# Patient Record
Sex: Female | Born: 1945 | Race: White | Hispanic: No | Marital: Married | Smoking: Former smoker
Health system: Southern US, Community
[De-identification: ages and names within clinical notes are randomized; demographics above are authoritative.]

## PROBLEM LIST (undated history)

## (undated) DIAGNOSIS — K219 Gastro-esophageal reflux disease without esophagitis: Secondary | ICD-10-CM

## (undated) DIAGNOSIS — M810 Age-related osteoporosis without current pathological fracture: Secondary | ICD-10-CM

## (undated) DIAGNOSIS — G4730 Sleep apnea, unspecified: Secondary | ICD-10-CM

## (undated) DIAGNOSIS — T4145XA Adverse effect of unspecified anesthetic, initial encounter: Secondary | ICD-10-CM

## (undated) DIAGNOSIS — T8859XA Other complications of anesthesia, initial encounter: Secondary | ICD-10-CM

## (undated) DIAGNOSIS — Z8669 Personal history of other diseases of the nervous system and sense organs: Secondary | ICD-10-CM

## (undated) DIAGNOSIS — Z8639 Personal history of other endocrine, nutritional and metabolic disease: Secondary | ICD-10-CM

## (undated) DIAGNOSIS — Z9889 Other specified postprocedural states: Secondary | ICD-10-CM

## (undated) DIAGNOSIS — R519 Headache, unspecified: Secondary | ICD-10-CM

## (undated) DIAGNOSIS — G40309 Generalized idiopathic epilepsy and epileptic syndromes, not intractable, without status epilepticus: Secondary | ICD-10-CM

## (undated) DIAGNOSIS — F329 Major depressive disorder, single episode, unspecified: Secondary | ICD-10-CM

## (undated) DIAGNOSIS — R112 Nausea with vomiting, unspecified: Secondary | ICD-10-CM

## (undated) DIAGNOSIS — C4490 Unspecified malignant neoplasm of skin, unspecified: Secondary | ICD-10-CM

## (undated) DIAGNOSIS — M351 Other overlap syndromes: Secondary | ICD-10-CM

## (undated) DIAGNOSIS — I10 Essential (primary) hypertension: Secondary | ICD-10-CM

## (undated) DIAGNOSIS — F419 Anxiety disorder, unspecified: Secondary | ICD-10-CM

## (undated) DIAGNOSIS — E785 Hyperlipidemia, unspecified: Secondary | ICD-10-CM

## (undated) DIAGNOSIS — M2652 Limited mandibular range of motion: Secondary | ICD-10-CM

## (undated) DIAGNOSIS — E7800 Pure hypercholesterolemia, unspecified: Secondary | ICD-10-CM

## (undated) DIAGNOSIS — H359 Unspecified retinal disorder: Secondary | ICD-10-CM

## (undated) DIAGNOSIS — R569 Unspecified convulsions: Secondary | ICD-10-CM

## (undated) DIAGNOSIS — G43909 Migraine, unspecified, not intractable, without status migrainosus: Secondary | ICD-10-CM

## (undated) DIAGNOSIS — M797 Fibromyalgia: Secondary | ICD-10-CM

## (undated) DIAGNOSIS — M1990 Unspecified osteoarthritis, unspecified site: Secondary | ICD-10-CM

## (undated) DIAGNOSIS — Z8679 Personal history of other diseases of the circulatory system: Secondary | ICD-10-CM

## (undated) DIAGNOSIS — H543 Unqualified visual loss, both eyes: Secondary | ICD-10-CM

## (undated) DIAGNOSIS — R51 Headache: Secondary | ICD-10-CM

## (undated) DIAGNOSIS — E079 Disorder of thyroid, unspecified: Secondary | ICD-10-CM

## (undated) DIAGNOSIS — R159 Full incontinence of feces: Secondary | ICD-10-CM

## (undated) DIAGNOSIS — F32A Depression, unspecified: Secondary | ICD-10-CM

## (undated) DIAGNOSIS — G4733 Obstructive sleep apnea (adult) (pediatric): Secondary | ICD-10-CM

## (undated) DIAGNOSIS — Z970 Presence of artificial eye: Secondary | ICD-10-CM

## (undated) DIAGNOSIS — Z85828 Personal history of other malignant neoplasm of skin: Secondary | ICD-10-CM

## (undated) HISTORY — DX: Headache: R51

## (undated) HISTORY — PX: SHOULDER ARTHROSCOPY WITH SUBACROMIAL DECOMPRESSION: SHX5684

## (undated) HISTORY — DX: Unspecified convulsions: R56.9

## (undated) HISTORY — DX: Pure hypercholesterolemia, unspecified: E78.00

## (undated) HISTORY — PX: OTHER SURGICAL HISTORY: SHX169

## (undated) HISTORY — DX: Unspecified malignant neoplasm of skin, unspecified: C44.90

## (undated) HISTORY — PX: CHOLECYSTECTOMY: SHX55

## (undated) HISTORY — DX: Unqualified visual loss, both eyes: H54.3

## (undated) HISTORY — PX: ABDOMINAL HYSTERECTOMY: SHX81

## (undated) HISTORY — DX: Essential (primary) hypertension: I10

## (undated) HISTORY — DX: Gastro-esophageal reflux disease without esophagitis: K21.9

## (undated) HISTORY — PX: TONSILLECTOMY: SUR1361

## (undated) HISTORY — PX: ENUCLEATION: SHX628

---
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
## 1982-01-26 HISTORY — PX: ENUCLEATION: SHX628

## 1987-01-27 HISTORY — PX: TEMPOROMANDIBULAR JOINT SURGERY: SHX35

## 1991-01-27 HISTORY — PX: LAPAROSCOPIC CHOLECYSTECTOMY: SUR755

## 1994-01-26 HISTORY — PX: EVISCERATION: SHX1539

## 2007-09-15 ENCOUNTER — Ambulatory Visit (HOSPITAL_COMMUNITY): Admission: RE | Admit: 2007-09-15 | Discharge: 2007-09-15 | Payer: Self-pay | Admitting: Orthopedic Surgery

## 2010-01-07 ENCOUNTER — Encounter
Admission: RE | Admit: 2010-01-07 | Discharge: 2010-01-07 | Payer: Self-pay | Source: Home / Self Care | Attending: Neurology | Admitting: Neurology

## 2010-06-10 NOTE — Op Note (Signed)
Erica Bennett, Erica Bennett              ACCOUNT NO.:  [id number]   MEDICAL RECORD NO.:  [id number]          PATIENT TYPE:  AMB   LOCATION:  DAY                          FACILITY:  [facility]   PHYSICIAN:  John L. Rendall, M.D.  DATE OF BIRTH:  01/19/1946   DATE OF PROCEDURE:  09/15/2007  DATE OF DISCHARGE:                               OPERATIVE REPORT   INDICATIONS AND JUSTIFCATION FOR PROCEDURE:  Chronic right shoulder  pain, unremitting with injection and therapy with findings of  impingement syndrome on MRI with partially frayed rotator cuff and  downward sloping acromion.   JUSTIFICATION FOR OUTPATIENT SETTING:  Inpatient not required.   PREOPERATIVE DIAGNOSIS:  Impingement syndrome with partially frayed and  torn but not full thickness rotator cuff tear, right shoulder.   SURGICAL PROCEDURES:  1. Arthroscopic subacromial decompression with bursectomy and      acromioplasty.  2. Glenohumeral debridement of partial thickness frayed rotator cuff.   POSTOPERATIVE DIAGNOSIS:  Impingement syndrome with partially frayed and  torn but not full thickness rotator cuff tear, right shoulder.   SURGEON:  John L. Rendall, M.D.   ANESTHESIA:  General with scalene block.   PROCEDURE:  Under scalene block with anesthesia, the right shoulder was  prepared with DuraPrep and draped as a sterile field.  In the left  lateral decubitus position on a bean bag, the arm was suspended with the  fishing pole shoulder holder and 15 pounds weight.  Anatomic landmarks  were marked out.  The shoulder was injected with 30 mL of saline with  epinephrine to assist in controlling bleeding, both in the glenohumeral  and subacromial space.  The glenohumeral joint was entered through a  posterior puncture after first probing with an 18 gauge needle.  The  glenohumeral joint has a normal labrum with the exception of slight  fraying of the labrum posteriorly.  Biceps was intact.  Humeral head and  glenoid showed no  significant arthritic damage, however up near the  biceps tendon.  There is fraying of the cuff and by Wissinger rod  technique, an anterior working portal is made and a 4 mm intra-articular  shaver is introduced and the partial thickness tear was debrided.  It  was not full thickness.  After the debridement is complete, the  subacromial space was entered with a posterior viewing portal, anterior  irrigation portal and lateral working portal.  First a bursectomy is  carried out and the [employer] wand set on 7 was used for periosteal  debridement of the undersurface of the acromion. Significant spurring  was seen at the anterolateral margin of the acromion.  The wand was used  to expose of 3-4 mm of this and then a 6 mm bur is used to plane the  undersurface of the acromion, taking out the spur.  The [facility] joint was  viewed there is no significant undersurface spurring and she was  asymptomatic there.  No planing was done of that joint.  At this point  the outside of the rotator cuff is reviewed.  There is no significant  fraying on the outside of  the cuff.  It should be noted that in  examining the glenohumeral joint, one of the shaver portals went through  the supraspinatus in the muscular portion.  It caused no damage.  At  this point having completed the planing, the traction was let off and  appropriate subacromial  decompression was seen.  The wound was irrigated with saline and then  closed with 4-0 nylon.  No injection was done in the shoulder as the  scalene block was excellent.  A sterile dressing and arm sling were  applied and the patient returned to recovery in good condition.      John L. Rendall, M.D.  Electronically Signed     JLR/MEDQ  D:  09/15/2007  T:  09/15/2007  Job:  [id number]

## 2011-02-25 ENCOUNTER — Other Ambulatory Visit (HOSPITAL_COMMUNITY): Payer: Self-pay | Admitting: Neurology

## 2011-02-25 DIAGNOSIS — R413 Other amnesia: Secondary | ICD-10-CM

## 2011-02-25 DIAGNOSIS — G40219 Localization-related (focal) (partial) symptomatic epilepsy and epileptic syndromes with complex partial seizures, intractable, without status epilepticus: Secondary | ICD-10-CM | POA: Diagnosis not present

## 2011-02-25 DIAGNOSIS — Z5181 Encounter for therapeutic drug level monitoring: Secondary | ICD-10-CM | POA: Diagnosis not present

## 2011-02-25 DIAGNOSIS — G43019 Migraine without aura, intractable, without status migrainosus: Secondary | ICD-10-CM | POA: Diagnosis not present

## 2011-03-09 ENCOUNTER — Encounter (HOSPITAL_COMMUNITY)
Admission: RE | Admit: 2011-03-09 | Discharge: 2011-03-09 | Disposition: A | Discharge status: Home / Self Care | Payer: Medicare Other | Source: Ambulatory Visit | Attending: Neurology | Admitting: Neurology

## 2011-03-09 DIAGNOSIS — R413 Other amnesia: Secondary | ICD-10-CM | POA: Diagnosis not present

## 2011-03-09 MED ORDER — FLUDEOXYGLUCOSE F - 18 (FDG) INJECTION
9.5000 | Freq: Once | INTRAVENOUS | Status: AC | PRN
  Administered 2011-03-09: 9.5 via INTRAVENOUS

## 2011-03-18 DIAGNOSIS — N898 Other specified noninflammatory disorders of vagina: Secondary | ICD-10-CM | POA: Diagnosis not present

## 2011-03-23 DIAGNOSIS — G40909 Epilepsy, unspecified, not intractable, without status epilepticus: Secondary | ICD-10-CM | POA: Diagnosis not present

## 2011-03-23 DIAGNOSIS — F0391 Unspecified dementia with behavioral disturbance: Secondary | ICD-10-CM | POA: Diagnosis not present

## 2011-03-23 DIAGNOSIS — J209 Acute bronchitis, unspecified: Secondary | ICD-10-CM | POA: Diagnosis not present

## 2011-03-23 DIAGNOSIS — I10 Essential (primary) hypertension: Secondary | ICD-10-CM | POA: Diagnosis not present

## 2011-03-23 DIAGNOSIS — K219 Gastro-esophageal reflux disease without esophagitis: Secondary | ICD-10-CM | POA: Diagnosis not present

## 2011-03-23 DIAGNOSIS — F909 Attention-deficit hyperactivity disorder, unspecified type: Secondary | ICD-10-CM | POA: Diagnosis not present

## 2011-05-05 DIAGNOSIS — R197 Diarrhea, unspecified: Secondary | ICD-10-CM | POA: Diagnosis not present

## 2011-05-12 DIAGNOSIS — F0390 Unspecified dementia without behavioral disturbance: Secondary | ICD-10-CM | POA: Diagnosis not present

## 2011-05-12 DIAGNOSIS — K5730 Diverticulosis of large intestine without perforation or abscess without bleeding: Secondary | ICD-10-CM | POA: Diagnosis not present

## 2011-05-12 DIAGNOSIS — G40909 Epilepsy, unspecified, not intractable, without status epilepticus: Secondary | ICD-10-CM | POA: Diagnosis not present

## 2011-05-12 DIAGNOSIS — R413 Other amnesia: Secondary | ICD-10-CM | POA: Diagnosis not present

## 2011-05-12 DIAGNOSIS — Z800 Family history of malignant neoplasm of digestive organs: Secondary | ICD-10-CM | POA: Diagnosis not present

## 2011-05-12 DIAGNOSIS — F411 Generalized anxiety disorder: Secondary | ICD-10-CM | POA: Diagnosis not present

## 2011-05-12 DIAGNOSIS — E039 Hypothyroidism, unspecified: Secondary | ICD-10-CM | POA: Diagnosis not present

## 2011-05-12 DIAGNOSIS — R197 Diarrhea, unspecified: Secondary | ICD-10-CM | POA: Diagnosis not present

## 2011-05-12 DIAGNOSIS — Z8679 Personal history of other diseases of the circulatory system: Secondary | ICD-10-CM | POA: Diagnosis not present

## 2011-05-12 DIAGNOSIS — Z87891 Personal history of nicotine dependence: Secondary | ICD-10-CM | POA: Diagnosis not present

## 2011-05-12 DIAGNOSIS — K219 Gastro-esophageal reflux disease without esophagitis: Secondary | ICD-10-CM | POA: Diagnosis not present

## 2011-05-12 DIAGNOSIS — D129 Benign neoplasm of anus and anal canal: Secondary | ICD-10-CM | POA: Diagnosis not present

## 2011-05-12 DIAGNOSIS — D128 Benign neoplasm of rectum: Secondary | ICD-10-CM | POA: Diagnosis not present

## 2011-05-12 DIAGNOSIS — D126 Benign neoplasm of colon, unspecified: Secondary | ICD-10-CM | POA: Diagnosis not present

## 2011-05-12 DIAGNOSIS — H409 Unspecified glaucoma: Secondary | ICD-10-CM | POA: Diagnosis not present

## 2011-05-12 DIAGNOSIS — H547 Unspecified visual loss: Secondary | ICD-10-CM | POA: Diagnosis not present

## 2011-05-12 DIAGNOSIS — K620 Anal polyp: Secondary | ICD-10-CM | POA: Diagnosis not present

## 2011-05-12 DIAGNOSIS — Z79899 Other long term (current) drug therapy: Secondary | ICD-10-CM | POA: Diagnosis not present

## 2011-05-12 DIAGNOSIS — I10 Essential (primary) hypertension: Secondary | ICD-10-CM | POA: Diagnosis not present

## 2011-05-12 DIAGNOSIS — K621 Rectal polyp: Secondary | ICD-10-CM | POA: Diagnosis not present

## 2011-05-25 DIAGNOSIS — Z1231 Encounter for screening mammogram for malignant neoplasm of breast: Secondary | ICD-10-CM | POA: Diagnosis not present

## 2011-06-19 DIAGNOSIS — G43109 Migraine with aura, not intractable, without status migrainosus: Secondary | ICD-10-CM | POA: Diagnosis not present

## 2011-09-21 DIAGNOSIS — G43709 Chronic migraine without aura, not intractable, without status migrainosus: Secondary | ICD-10-CM | POA: Diagnosis not present

## 2011-10-14 DIAGNOSIS — D485 Neoplasm of uncertain behavior of skin: Secondary | ICD-10-CM | POA: Diagnosis not present

## 2011-10-14 DIAGNOSIS — L820 Inflamed seborrheic keratosis: Secondary | ICD-10-CM | POA: Diagnosis not present

## 2011-10-14 DIAGNOSIS — L723 Sebaceous cyst: Secondary | ICD-10-CM | POA: Diagnosis not present

## 2011-10-14 DIAGNOSIS — L821 Other seborrheic keratosis: Secondary | ICD-10-CM | POA: Diagnosis not present

## 2011-10-14 DIAGNOSIS — D1801 Hemangioma of skin and subcutaneous tissue: Secondary | ICD-10-CM | POA: Diagnosis not present

## 2011-10-14 DIAGNOSIS — D235 Other benign neoplasm of skin of trunk: Secondary | ICD-10-CM | POA: Diagnosis not present

## 2011-10-21 DIAGNOSIS — L905 Scar conditions and fibrosis of skin: Secondary | ICD-10-CM | POA: Diagnosis not present

## 2011-12-08 DIAGNOSIS — M25579 Pain in unspecified ankle and joints of unspecified foot: Secondary | ICD-10-CM | POA: Diagnosis not present

## 2011-12-08 DIAGNOSIS — M79609 Pain in unspecified limb: Secondary | ICD-10-CM | POA: Diagnosis not present

## 2011-12-08 DIAGNOSIS — M722 Plantar fascial fibromatosis: Secondary | ICD-10-CM | POA: Diagnosis not present

## 2011-12-25 DIAGNOSIS — I10 Essential (primary) hypertension: Secondary | ICD-10-CM | POA: Diagnosis not present

## 2011-12-25 DIAGNOSIS — R1032 Left lower quadrant pain: Secondary | ICD-10-CM | POA: Diagnosis not present

## 2011-12-29 DIAGNOSIS — M79609 Pain in unspecified limb: Secondary | ICD-10-CM | POA: Diagnosis not present

## 2011-12-29 DIAGNOSIS — M25579 Pain in unspecified ankle and joints of unspecified foot: Secondary | ICD-10-CM | POA: Diagnosis not present

## 2011-12-31 ENCOUNTER — Encounter: Payer: Self-pay | Admitting: Family Medicine

## 2011-12-31 DIAGNOSIS — K4020 Bilateral inguinal hernia, without obstruction or gangrene, not specified as recurrent: Secondary | ICD-10-CM | POA: Diagnosis not present

## 2011-12-31 DIAGNOSIS — R109 Unspecified abdominal pain: Secondary | ICD-10-CM | POA: Diagnosis not present

## 2011-12-31 DIAGNOSIS — R32 Unspecified urinary incontinence: Secondary | ICD-10-CM | POA: Diagnosis not present

## 2011-12-31 DIAGNOSIS — K5730 Diverticulosis of large intestine without perforation or abscess without bleeding: Secondary | ICD-10-CM | POA: Diagnosis not present

## 2012-02-09 DIAGNOSIS — M25579 Pain in unspecified ankle and joints of unspecified foot: Secondary | ICD-10-CM | POA: Diagnosis not present

## 2012-02-09 DIAGNOSIS — M79609 Pain in unspecified limb: Secondary | ICD-10-CM | POA: Diagnosis not present

## 2012-03-17 DIAGNOSIS — Z85828 Personal history of other malignant neoplasm of skin: Secondary | ICD-10-CM | POA: Diagnosis not present

## 2012-03-17 DIAGNOSIS — C4491 Basal cell carcinoma of skin, unspecified: Secondary | ICD-10-CM | POA: Diagnosis not present

## 2012-03-17 DIAGNOSIS — L821 Other seborrheic keratosis: Secondary | ICD-10-CM | POA: Diagnosis not present

## 2012-03-17 DIAGNOSIS — L820 Inflamed seborrheic keratosis: Secondary | ICD-10-CM | POA: Diagnosis not present

## 2012-03-17 DIAGNOSIS — D485 Neoplasm of uncertain behavior of skin: Secondary | ICD-10-CM | POA: Diagnosis not present

## 2012-03-17 DIAGNOSIS — L905 Scar conditions and fibrosis of skin: Secondary | ICD-10-CM | POA: Diagnosis not present

## 2012-03-23 DIAGNOSIS — C44519 Basal cell carcinoma of skin of other part of trunk: Secondary | ICD-10-CM | POA: Diagnosis not present

## 2012-03-23 DIAGNOSIS — C4491 Basal cell carcinoma of skin, unspecified: Secondary | ICD-10-CM | POA: Diagnosis not present

## 2012-03-29 DIAGNOSIS — I10 Essential (primary) hypertension: Secondary | ICD-10-CM | POA: Diagnosis not present

## 2012-04-06 DIAGNOSIS — Z85828 Personal history of other malignant neoplasm of skin: Secondary | ICD-10-CM | POA: Diagnosis not present

## 2012-04-06 DIAGNOSIS — L820 Inflamed seborrheic keratosis: Secondary | ICD-10-CM | POA: Diagnosis not present

## 2012-04-06 DIAGNOSIS — L905 Scar conditions and fibrosis of skin: Secondary | ICD-10-CM | POA: Diagnosis not present

## 2012-04-06 DIAGNOSIS — L821 Other seborrheic keratosis: Secondary | ICD-10-CM | POA: Diagnosis not present

## 2012-04-06 DIAGNOSIS — C44519 Basal cell carcinoma of skin of other part of trunk: Secondary | ICD-10-CM | POA: Diagnosis not present

## 2012-05-05 DIAGNOSIS — C44519 Basal cell carcinoma of skin of other part of trunk: Secondary | ICD-10-CM | POA: Diagnosis not present

## 2012-05-25 DIAGNOSIS — Z1231 Encounter for screening mammogram for malignant neoplasm of breast: Secondary | ICD-10-CM | POA: Diagnosis not present

## 2012-06-28 DIAGNOSIS — I10 Essential (primary) hypertension: Secondary | ICD-10-CM | POA: Diagnosis not present

## 2012-08-01 DIAGNOSIS — Z01419 Encounter for gynecological examination (general) (routine) without abnormal findings: Secondary | ICD-10-CM | POA: Diagnosis not present

## 2012-08-03 DIAGNOSIS — Z85828 Personal history of other malignant neoplasm of skin: Secondary | ICD-10-CM | POA: Diagnosis not present

## 2012-08-03 DIAGNOSIS — D485 Neoplasm of uncertain behavior of skin: Secondary | ICD-10-CM | POA: Diagnosis not present

## 2012-08-16 DIAGNOSIS — M25579 Pain in unspecified ankle and joints of unspecified foot: Secondary | ICD-10-CM | POA: Diagnosis not present

## 2012-08-16 DIAGNOSIS — M79609 Pain in unspecified limb: Secondary | ICD-10-CM | POA: Diagnosis not present

## 2012-08-26 DIAGNOSIS — C44519 Basal cell carcinoma of skin of other part of trunk: Secondary | ICD-10-CM | POA: Diagnosis not present

## 2012-09-05 DIAGNOSIS — B029 Zoster without complications: Secondary | ICD-10-CM | POA: Diagnosis not present

## 2012-09-30 DIAGNOSIS — I10 Essential (primary) hypertension: Secondary | ICD-10-CM | POA: Diagnosis not present

## 2012-09-30 DIAGNOSIS — Z0000 Encounter for general adult medical examination without abnormal findings: Secondary | ICD-10-CM | POA: Diagnosis not present

## 2012-10-19 DIAGNOSIS — Z85828 Personal history of other malignant neoplasm of skin: Secondary | ICD-10-CM | POA: Diagnosis not present

## 2012-10-25 DIAGNOSIS — M79609 Pain in unspecified limb: Secondary | ICD-10-CM | POA: Diagnosis not present

## 2012-10-25 DIAGNOSIS — M25579 Pain in unspecified ankle and joints of unspecified foot: Secondary | ICD-10-CM | POA: Diagnosis not present

## 2012-11-15 DIAGNOSIS — Z86010 Personal history of colonic polyps: Secondary | ICD-10-CM | POA: Diagnosis not present

## 2012-11-29 DIAGNOSIS — Z87891 Personal history of nicotine dependence: Secondary | ICD-10-CM | POA: Diagnosis not present

## 2012-11-29 DIAGNOSIS — K219 Gastro-esophageal reflux disease without esophagitis: Secondary | ICD-10-CM | POA: Diagnosis not present

## 2012-11-29 DIAGNOSIS — G40909 Epilepsy, unspecified, not intractable, without status epilepticus: Secondary | ICD-10-CM | POA: Diagnosis not present

## 2012-11-29 DIAGNOSIS — Z1211 Encounter for screening for malignant neoplasm of colon: Secondary | ICD-10-CM | POA: Diagnosis not present

## 2012-11-29 DIAGNOSIS — K589 Irritable bowel syndrome without diarrhea: Secondary | ICD-10-CM | POA: Diagnosis not present

## 2012-11-29 DIAGNOSIS — I10 Essential (primary) hypertension: Secondary | ICD-10-CM | POA: Diagnosis not present

## 2012-11-29 DIAGNOSIS — K5730 Diverticulosis of large intestine without perforation or abscess without bleeding: Secondary | ICD-10-CM | POA: Diagnosis not present

## 2012-11-29 DIAGNOSIS — E039 Hypothyroidism, unspecified: Secondary | ICD-10-CM | POA: Diagnosis not present

## 2012-11-29 DIAGNOSIS — Z882 Allergy status to sulfonamides status: Secondary | ICD-10-CM | POA: Diagnosis not present

## 2012-11-29 DIAGNOSIS — H543 Unqualified visual loss, both eyes: Secondary | ICD-10-CM | POA: Diagnosis not present

## 2012-11-29 DIAGNOSIS — Z800 Family history of malignant neoplasm of digestive organs: Secondary | ICD-10-CM | POA: Diagnosis not present

## 2012-11-29 DIAGNOSIS — Z860100 Personal history of colon polyps, unspecified: Secondary | ICD-10-CM | POA: Diagnosis not present

## 2012-11-29 DIAGNOSIS — F0390 Unspecified dementia without behavioral disturbance: Secondary | ICD-10-CM | POA: Diagnosis not present

## 2012-11-29 DIAGNOSIS — D126 Benign neoplasm of colon, unspecified: Secondary | ICD-10-CM | POA: Diagnosis not present

## 2012-11-29 DIAGNOSIS — Z8489 Family history of other specified conditions: Secondary | ICD-10-CM | POA: Diagnosis not present

## 2012-11-29 DIAGNOSIS — Z79899 Other long term (current) drug therapy: Secondary | ICD-10-CM | POA: Diagnosis not present

## 2012-11-29 DIAGNOSIS — Z8042 Family history of malignant neoplasm of prostate: Secondary | ICD-10-CM | POA: Diagnosis not present

## 2012-11-30 DIAGNOSIS — D485 Neoplasm of uncertain behavior of skin: Secondary | ICD-10-CM | POA: Diagnosis not present

## 2012-11-30 DIAGNOSIS — L570 Actinic keratosis: Secondary | ICD-10-CM | POA: Diagnosis not present

## 2012-11-30 DIAGNOSIS — Z85828 Personal history of other malignant neoplasm of skin: Secondary | ICD-10-CM | POA: Diagnosis not present

## 2013-01-02 DIAGNOSIS — I10 Essential (primary) hypertension: Secondary | ICD-10-CM | POA: Diagnosis not present

## 2013-03-29 DIAGNOSIS — Z85828 Personal history of other malignant neoplasm of skin: Secondary | ICD-10-CM | POA: Diagnosis not present

## 2013-03-29 DIAGNOSIS — L570 Actinic keratosis: Secondary | ICD-10-CM | POA: Diagnosis not present

## 2013-04-07 DIAGNOSIS — I10 Essential (primary) hypertension: Secondary | ICD-10-CM | POA: Diagnosis not present

## 2013-04-07 DIAGNOSIS — M549 Dorsalgia, unspecified: Secondary | ICD-10-CM | POA: Diagnosis not present

## 2013-04-07 DIAGNOSIS — G4700 Insomnia, unspecified: Secondary | ICD-10-CM | POA: Diagnosis not present

## 2013-04-11 DIAGNOSIS — M25579 Pain in unspecified ankle and joints of unspecified foot: Secondary | ICD-10-CM | POA: Diagnosis not present

## 2013-04-11 DIAGNOSIS — M79609 Pain in unspecified limb: Secondary | ICD-10-CM | POA: Diagnosis not present

## 2013-04-14 DIAGNOSIS — M999 Biomechanical lesion, unspecified: Secondary | ICD-10-CM | POA: Diagnosis not present

## 2013-04-14 DIAGNOSIS — M545 Low back pain, unspecified: Secondary | ICD-10-CM | POA: Diagnosis not present

## 2013-04-14 DIAGNOSIS — S335XXA Sprain of ligaments of lumbar spine, initial encounter: Secondary | ICD-10-CM | POA: Diagnosis not present

## 2013-04-18 DIAGNOSIS — M545 Low back pain, unspecified: Secondary | ICD-10-CM | POA: Diagnosis not present

## 2013-04-18 DIAGNOSIS — M999 Biomechanical lesion, unspecified: Secondary | ICD-10-CM | POA: Diagnosis not present

## 2013-04-18 DIAGNOSIS — S335XXA Sprain of ligaments of lumbar spine, initial encounter: Secondary | ICD-10-CM | POA: Diagnosis not present

## 2013-04-20 DIAGNOSIS — M545 Low back pain, unspecified: Secondary | ICD-10-CM | POA: Diagnosis not present

## 2013-04-20 DIAGNOSIS — S335XXA Sprain of ligaments of lumbar spine, initial encounter: Secondary | ICD-10-CM | POA: Diagnosis not present

## 2013-04-20 DIAGNOSIS — M999 Biomechanical lesion, unspecified: Secondary | ICD-10-CM | POA: Diagnosis not present

## 2013-04-24 DIAGNOSIS — S335XXA Sprain of ligaments of lumbar spine, initial encounter: Secondary | ICD-10-CM | POA: Diagnosis not present

## 2013-04-24 DIAGNOSIS — M545 Low back pain, unspecified: Secondary | ICD-10-CM | POA: Diagnosis not present

## 2013-04-24 DIAGNOSIS — M999 Biomechanical lesion, unspecified: Secondary | ICD-10-CM | POA: Diagnosis not present

## 2013-04-26 DIAGNOSIS — M545 Low back pain, unspecified: Secondary | ICD-10-CM | POA: Diagnosis not present

## 2013-04-26 DIAGNOSIS — S335XXA Sprain of ligaments of lumbar spine, initial encounter: Secondary | ICD-10-CM | POA: Diagnosis not present

## 2013-04-26 DIAGNOSIS — M999 Biomechanical lesion, unspecified: Secondary | ICD-10-CM | POA: Diagnosis not present

## 2013-05-02 DIAGNOSIS — M545 Low back pain, unspecified: Secondary | ICD-10-CM | POA: Diagnosis not present

## 2013-05-02 DIAGNOSIS — M999 Biomechanical lesion, unspecified: Secondary | ICD-10-CM | POA: Diagnosis not present

## 2013-05-02 DIAGNOSIS — S335XXA Sprain of ligaments of lumbar spine, initial encounter: Secondary | ICD-10-CM | POA: Diagnosis not present

## 2013-05-10 DIAGNOSIS — M545 Low back pain, unspecified: Secondary | ICD-10-CM | POA: Diagnosis not present

## 2013-05-10 DIAGNOSIS — S335XXA Sprain of ligaments of lumbar spine, initial encounter: Secondary | ICD-10-CM | POA: Diagnosis not present

## 2013-05-10 DIAGNOSIS — M999 Biomechanical lesion, unspecified: Secondary | ICD-10-CM | POA: Diagnosis not present

## 2013-05-16 DIAGNOSIS — M999 Biomechanical lesion, unspecified: Secondary | ICD-10-CM | POA: Diagnosis not present

## 2013-05-16 DIAGNOSIS — S335XXA Sprain of ligaments of lumbar spine, initial encounter: Secondary | ICD-10-CM | POA: Diagnosis not present

## 2013-05-16 DIAGNOSIS — M545 Low back pain, unspecified: Secondary | ICD-10-CM | POA: Diagnosis not present

## 2013-05-23 DIAGNOSIS — M999 Biomechanical lesion, unspecified: Secondary | ICD-10-CM | POA: Diagnosis not present

## 2013-05-23 DIAGNOSIS — M545 Low back pain, unspecified: Secondary | ICD-10-CM | POA: Diagnosis not present

## 2013-05-23 DIAGNOSIS — S335XXA Sprain of ligaments of lumbar spine, initial encounter: Secondary | ICD-10-CM | POA: Diagnosis not present

## 2013-05-25 DIAGNOSIS — M999 Biomechanical lesion, unspecified: Secondary | ICD-10-CM | POA: Diagnosis not present

## 2013-05-25 DIAGNOSIS — M545 Low back pain, unspecified: Secondary | ICD-10-CM | POA: Diagnosis not present

## 2013-05-25 DIAGNOSIS — S335XXA Sprain of ligaments of lumbar spine, initial encounter: Secondary | ICD-10-CM | POA: Diagnosis not present

## 2013-06-05 DIAGNOSIS — Z1231 Encounter for screening mammogram for malignant neoplasm of breast: Secondary | ICD-10-CM | POA: Diagnosis not present

## 2013-06-06 DIAGNOSIS — M545 Low back pain, unspecified: Secondary | ICD-10-CM | POA: Diagnosis not present

## 2013-06-06 DIAGNOSIS — M999 Biomechanical lesion, unspecified: Secondary | ICD-10-CM | POA: Diagnosis not present

## 2013-06-06 DIAGNOSIS — S335XXA Sprain of ligaments of lumbar spine, initial encounter: Secondary | ICD-10-CM | POA: Diagnosis not present

## 2013-06-13 DIAGNOSIS — S335XXA Sprain of ligaments of lumbar spine, initial encounter: Secondary | ICD-10-CM | POA: Diagnosis not present

## 2013-06-13 DIAGNOSIS — M999 Biomechanical lesion, unspecified: Secondary | ICD-10-CM | POA: Diagnosis not present

## 2013-06-13 DIAGNOSIS — M545 Low back pain, unspecified: Secondary | ICD-10-CM | POA: Diagnosis not present

## 2013-06-20 DIAGNOSIS — M545 Low back pain, unspecified: Secondary | ICD-10-CM | POA: Diagnosis not present

## 2013-06-20 DIAGNOSIS — M999 Biomechanical lesion, unspecified: Secondary | ICD-10-CM | POA: Diagnosis not present

## 2013-06-20 DIAGNOSIS — S335XXA Sprain of ligaments of lumbar spine, initial encounter: Secondary | ICD-10-CM | POA: Diagnosis not present

## 2013-06-27 DIAGNOSIS — M545 Low back pain, unspecified: Secondary | ICD-10-CM | POA: Diagnosis not present

## 2013-06-27 DIAGNOSIS — S335XXA Sprain of ligaments of lumbar spine, initial encounter: Secondary | ICD-10-CM | POA: Diagnosis not present

## 2013-06-27 DIAGNOSIS — M999 Biomechanical lesion, unspecified: Secondary | ICD-10-CM | POA: Diagnosis not present

## 2013-07-04 DIAGNOSIS — M999 Biomechanical lesion, unspecified: Secondary | ICD-10-CM | POA: Diagnosis not present

## 2013-07-04 DIAGNOSIS — M545 Low back pain, unspecified: Secondary | ICD-10-CM | POA: Diagnosis not present

## 2013-07-04 DIAGNOSIS — S335XXA Sprain of ligaments of lumbar spine, initial encounter: Secondary | ICD-10-CM | POA: Diagnosis not present

## 2013-07-04 DIAGNOSIS — M778 Other enthesopathies, not elsewhere classified: Secondary | ICD-10-CM | POA: Diagnosis not present

## 2013-07-10 DIAGNOSIS — M545 Low back pain, unspecified: Secondary | ICD-10-CM | POA: Diagnosis not present

## 2013-07-10 DIAGNOSIS — S335XXA Sprain of ligaments of lumbar spine, initial encounter: Secondary | ICD-10-CM | POA: Diagnosis not present

## 2013-07-10 DIAGNOSIS — M999 Biomechanical lesion, unspecified: Secondary | ICD-10-CM | POA: Diagnosis not present

## 2013-07-17 DIAGNOSIS — M999 Biomechanical lesion, unspecified: Secondary | ICD-10-CM | POA: Diagnosis not present

## 2013-07-17 DIAGNOSIS — M545 Low back pain, unspecified: Secondary | ICD-10-CM | POA: Diagnosis not present

## 2013-07-17 DIAGNOSIS — S335XXA Sprain of ligaments of lumbar spine, initial encounter: Secondary | ICD-10-CM | POA: Diagnosis not present

## 2013-07-24 DIAGNOSIS — Z85828 Personal history of other malignant neoplasm of skin: Secondary | ICD-10-CM | POA: Diagnosis not present

## 2013-07-31 DIAGNOSIS — M545 Low back pain, unspecified: Secondary | ICD-10-CM | POA: Diagnosis not present

## 2013-07-31 DIAGNOSIS — S335XXA Sprain of ligaments of lumbar spine, initial encounter: Secondary | ICD-10-CM | POA: Diagnosis not present

## 2013-07-31 DIAGNOSIS — M999 Biomechanical lesion, unspecified: Secondary | ICD-10-CM | POA: Diagnosis not present

## 2013-08-08 DIAGNOSIS — M999 Biomechanical lesion, unspecified: Secondary | ICD-10-CM | POA: Diagnosis not present

## 2013-08-08 DIAGNOSIS — S335XXA Sprain of ligaments of lumbar spine, initial encounter: Secondary | ICD-10-CM | POA: Diagnosis not present

## 2013-08-08 DIAGNOSIS — M545 Low back pain, unspecified: Secondary | ICD-10-CM | POA: Diagnosis not present

## 2013-08-08 DIAGNOSIS — M47817 Spondylosis without myelopathy or radiculopathy, lumbosacral region: Secondary | ICD-10-CM | POA: Diagnosis not present

## 2013-08-10 DIAGNOSIS — M545 Low back pain, unspecified: Secondary | ICD-10-CM | POA: Diagnosis not present

## 2013-08-10 DIAGNOSIS — S335XXA Sprain of ligaments of lumbar spine, initial encounter: Secondary | ICD-10-CM | POA: Diagnosis not present

## 2013-08-10 DIAGNOSIS — M999 Biomechanical lesion, unspecified: Secondary | ICD-10-CM | POA: Diagnosis not present

## 2013-08-14 DIAGNOSIS — M999 Biomechanical lesion, unspecified: Secondary | ICD-10-CM | POA: Diagnosis not present

## 2013-08-14 DIAGNOSIS — M545 Low back pain, unspecified: Secondary | ICD-10-CM | POA: Diagnosis not present

## 2013-08-14 DIAGNOSIS — S335XXA Sprain of ligaments of lumbar spine, initial encounter: Secondary | ICD-10-CM | POA: Diagnosis not present

## 2013-08-21 DIAGNOSIS — M999 Biomechanical lesion, unspecified: Secondary | ICD-10-CM | POA: Diagnosis not present

## 2013-08-21 DIAGNOSIS — S335XXA Sprain of ligaments of lumbar spine, initial encounter: Secondary | ICD-10-CM | POA: Diagnosis not present

## 2013-08-21 DIAGNOSIS — M545 Low back pain, unspecified: Secondary | ICD-10-CM | POA: Diagnosis not present

## 2013-08-28 DIAGNOSIS — M545 Low back pain, unspecified: Secondary | ICD-10-CM | POA: Diagnosis not present

## 2013-08-28 DIAGNOSIS — S335XXA Sprain of ligaments of lumbar spine, initial encounter: Secondary | ICD-10-CM | POA: Diagnosis not present

## 2013-08-28 DIAGNOSIS — M999 Biomechanical lesion, unspecified: Secondary | ICD-10-CM | POA: Diagnosis not present

## 2013-09-04 DIAGNOSIS — M999 Biomechanical lesion, unspecified: Secondary | ICD-10-CM | POA: Diagnosis not present

## 2013-09-04 DIAGNOSIS — M545 Low back pain, unspecified: Secondary | ICD-10-CM | POA: Diagnosis not present

## 2013-09-04 DIAGNOSIS — S335XXA Sprain of ligaments of lumbar spine, initial encounter: Secondary | ICD-10-CM | POA: Diagnosis not present

## 2013-09-14 DIAGNOSIS — IMO0002 Reserved for concepts with insufficient information to code with codable children: Secondary | ICD-10-CM | POA: Diagnosis not present

## 2013-09-18 DIAGNOSIS — M999 Biomechanical lesion, unspecified: Secondary | ICD-10-CM | POA: Diagnosis not present

## 2013-09-18 DIAGNOSIS — M545 Low back pain, unspecified: Secondary | ICD-10-CM | POA: Diagnosis not present

## 2013-09-18 DIAGNOSIS — S335XXA Sprain of ligaments of lumbar spine, initial encounter: Secondary | ICD-10-CM | POA: Diagnosis not present

## 2013-10-03 DIAGNOSIS — Z0000 Encounter for general adult medical examination without abnormal findings: Secondary | ICD-10-CM | POA: Diagnosis not present

## 2013-10-03 DIAGNOSIS — R5383 Other fatigue: Secondary | ICD-10-CM | POA: Diagnosis not present

## 2013-10-03 DIAGNOSIS — R5381 Other malaise: Secondary | ICD-10-CM | POA: Diagnosis not present

## 2013-10-03 DIAGNOSIS — Z1331 Encounter for screening for depression: Secondary | ICD-10-CM | POA: Diagnosis not present

## 2013-10-03 DIAGNOSIS — E559 Vitamin D deficiency, unspecified: Secondary | ICD-10-CM | POA: Diagnosis not present

## 2013-10-04 DIAGNOSIS — M545 Low back pain, unspecified: Secondary | ICD-10-CM | POA: Diagnosis not present

## 2013-10-04 DIAGNOSIS — S335XXA Sprain of ligaments of lumbar spine, initial encounter: Secondary | ICD-10-CM | POA: Diagnosis not present

## 2013-10-04 DIAGNOSIS — M999 Biomechanical lesion, unspecified: Secondary | ICD-10-CM | POA: Diagnosis not present

## 2013-11-01 DIAGNOSIS — M47816 Spondylosis without myelopathy or radiculopathy, lumbar region: Secondary | ICD-10-CM | POA: Diagnosis not present

## 2013-11-01 DIAGNOSIS — M9903 Segmental and somatic dysfunction of lumbar region: Secondary | ICD-10-CM | POA: Diagnosis not present

## 2013-11-01 DIAGNOSIS — M47812 Spondylosis without myelopathy or radiculopathy, cervical region: Secondary | ICD-10-CM | POA: Diagnosis not present

## 2013-11-01 DIAGNOSIS — S134XXA Sprain of ligaments of cervical spine, initial encounter: Secondary | ICD-10-CM | POA: Diagnosis not present

## 2013-11-01 DIAGNOSIS — S336XXA Sprain of sacroiliac joint, initial encounter: Secondary | ICD-10-CM | POA: Diagnosis not present

## 2013-11-02 DIAGNOSIS — S134XXA Sprain of ligaments of cervical spine, initial encounter: Secondary | ICD-10-CM | POA: Diagnosis not present

## 2013-11-02 DIAGNOSIS — M47812 Spondylosis without myelopathy or radiculopathy, cervical region: Secondary | ICD-10-CM | POA: Diagnosis not present

## 2013-11-02 DIAGNOSIS — M9903 Segmental and somatic dysfunction of lumbar region: Secondary | ICD-10-CM | POA: Diagnosis not present

## 2013-11-02 DIAGNOSIS — S336XXA Sprain of sacroiliac joint, initial encounter: Secondary | ICD-10-CM | POA: Diagnosis not present

## 2013-11-02 DIAGNOSIS — M47816 Spondylosis without myelopathy or radiculopathy, lumbar region: Secondary | ICD-10-CM | POA: Diagnosis not present

## 2013-11-03 DIAGNOSIS — S336XXA Sprain of sacroiliac joint, initial encounter: Secondary | ICD-10-CM | POA: Diagnosis not present

## 2013-11-03 DIAGNOSIS — M9903 Segmental and somatic dysfunction of lumbar region: Secondary | ICD-10-CM | POA: Diagnosis not present

## 2013-11-03 DIAGNOSIS — M47816 Spondylosis without myelopathy or radiculopathy, lumbar region: Secondary | ICD-10-CM | POA: Diagnosis not present

## 2013-11-06 DIAGNOSIS — M47816 Spondylosis without myelopathy or radiculopathy, lumbar region: Secondary | ICD-10-CM | POA: Diagnosis not present

## 2013-11-06 DIAGNOSIS — M9903 Segmental and somatic dysfunction of lumbar region: Secondary | ICD-10-CM | POA: Diagnosis not present

## 2013-11-06 DIAGNOSIS — S336XXA Sprain of sacroiliac joint, initial encounter: Secondary | ICD-10-CM | POA: Diagnosis not present

## 2013-11-08 DIAGNOSIS — S336XXA Sprain of sacroiliac joint, initial encounter: Secondary | ICD-10-CM | POA: Diagnosis not present

## 2013-11-08 DIAGNOSIS — M9903 Segmental and somatic dysfunction of lumbar region: Secondary | ICD-10-CM | POA: Diagnosis not present

## 2013-11-08 DIAGNOSIS — M47812 Spondylosis without myelopathy or radiculopathy, cervical region: Secondary | ICD-10-CM | POA: Diagnosis not present

## 2013-11-08 DIAGNOSIS — M47816 Spondylosis without myelopathy or radiculopathy, lumbar region: Secondary | ICD-10-CM | POA: Diagnosis not present

## 2013-11-08 DIAGNOSIS — S134XXA Sprain of ligaments of cervical spine, initial encounter: Secondary | ICD-10-CM | POA: Diagnosis not present

## 2013-11-09 DIAGNOSIS — M47816 Spondylosis without myelopathy or radiculopathy, lumbar region: Secondary | ICD-10-CM | POA: Diagnosis not present

## 2013-11-09 DIAGNOSIS — M9903 Segmental and somatic dysfunction of lumbar region: Secondary | ICD-10-CM | POA: Diagnosis not present

## 2013-11-09 DIAGNOSIS — S336XXA Sprain of sacroiliac joint, initial encounter: Secondary | ICD-10-CM | POA: Diagnosis not present

## 2013-11-10 DIAGNOSIS — M9903 Segmental and somatic dysfunction of lumbar region: Secondary | ICD-10-CM | POA: Diagnosis not present

## 2013-11-10 DIAGNOSIS — M47816 Spondylosis without myelopathy or radiculopathy, lumbar region: Secondary | ICD-10-CM | POA: Diagnosis not present

## 2013-11-10 DIAGNOSIS — S336XXA Sprain of sacroiliac joint, initial encounter: Secondary | ICD-10-CM | POA: Diagnosis not present

## 2013-11-13 DIAGNOSIS — M9903 Segmental and somatic dysfunction of lumbar region: Secondary | ICD-10-CM | POA: Diagnosis not present

## 2013-11-13 DIAGNOSIS — M47816 Spondylosis without myelopathy or radiculopathy, lumbar region: Secondary | ICD-10-CM | POA: Diagnosis not present

## 2013-11-13 DIAGNOSIS — S336XXA Sprain of sacroiliac joint, initial encounter: Secondary | ICD-10-CM | POA: Diagnosis not present

## 2013-11-20 DIAGNOSIS — S336XXA Sprain of sacroiliac joint, initial encounter: Secondary | ICD-10-CM | POA: Diagnosis not present

## 2013-11-20 DIAGNOSIS — M9903 Segmental and somatic dysfunction of lumbar region: Secondary | ICD-10-CM | POA: Diagnosis not present

## 2013-11-20 DIAGNOSIS — M47816 Spondylosis without myelopathy or radiculopathy, lumbar region: Secondary | ICD-10-CM | POA: Diagnosis not present

## 2013-11-27 DIAGNOSIS — M9903 Segmental and somatic dysfunction of lumbar region: Secondary | ICD-10-CM | POA: Diagnosis not present

## 2013-11-27 DIAGNOSIS — M47816 Spondylosis without myelopathy or radiculopathy, lumbar region: Secondary | ICD-10-CM | POA: Diagnosis not present

## 2013-11-27 DIAGNOSIS — S336XXA Sprain of sacroiliac joint, initial encounter: Secondary | ICD-10-CM | POA: Diagnosis not present

## 2013-12-04 DIAGNOSIS — M47816 Spondylosis without myelopathy or radiculopathy, lumbar region: Secondary | ICD-10-CM | POA: Diagnosis not present

## 2013-12-04 DIAGNOSIS — M9903 Segmental and somatic dysfunction of lumbar region: Secondary | ICD-10-CM | POA: Diagnosis not present

## 2013-12-04 DIAGNOSIS — S336XXA Sprain of sacroiliac joint, initial encounter: Secondary | ICD-10-CM | POA: Diagnosis not present

## 2013-12-18 DIAGNOSIS — S336XXA Sprain of sacroiliac joint, initial encounter: Secondary | ICD-10-CM | POA: Diagnosis not present

## 2013-12-18 DIAGNOSIS — M47812 Spondylosis without myelopathy or radiculopathy, cervical region: Secondary | ICD-10-CM | POA: Diagnosis not present

## 2013-12-18 DIAGNOSIS — M9903 Segmental and somatic dysfunction of lumbar region: Secondary | ICD-10-CM | POA: Diagnosis not present

## 2013-12-18 DIAGNOSIS — S134XXA Sprain of ligaments of cervical spine, initial encounter: Secondary | ICD-10-CM | POA: Diagnosis not present

## 2013-12-18 DIAGNOSIS — M47816 Spondylosis without myelopathy or radiculopathy, lumbar region: Secondary | ICD-10-CM | POA: Diagnosis not present

## 2013-12-29 DIAGNOSIS — S336XXA Sprain of sacroiliac joint, initial encounter: Secondary | ICD-10-CM | POA: Diagnosis not present

## 2013-12-29 DIAGNOSIS — M9903 Segmental and somatic dysfunction of lumbar region: Secondary | ICD-10-CM | POA: Diagnosis not present

## 2013-12-29 DIAGNOSIS — M47816 Spondylosis without myelopathy or radiculopathy, lumbar region: Secondary | ICD-10-CM | POA: Diagnosis not present

## 2014-01-01 DIAGNOSIS — M9903 Segmental and somatic dysfunction of lumbar region: Secondary | ICD-10-CM | POA: Diagnosis not present

## 2014-01-01 DIAGNOSIS — M47816 Spondylosis without myelopathy or radiculopathy, lumbar region: Secondary | ICD-10-CM | POA: Diagnosis not present

## 2014-01-01 DIAGNOSIS — L570 Actinic keratosis: Secondary | ICD-10-CM | POA: Diagnosis not present

## 2014-01-01 DIAGNOSIS — D485 Neoplasm of uncertain behavior of skin: Secondary | ICD-10-CM | POA: Diagnosis not present

## 2014-01-01 DIAGNOSIS — D2261 Melanocytic nevi of right upper limb, including shoulder: Secondary | ICD-10-CM | POA: Diagnosis not present

## 2014-01-01 DIAGNOSIS — D1801 Hemangioma of skin and subcutaneous tissue: Secondary | ICD-10-CM | POA: Diagnosis not present

## 2014-01-01 DIAGNOSIS — L821 Other seborrheic keratosis: Secondary | ICD-10-CM | POA: Diagnosis not present

## 2014-01-01 DIAGNOSIS — Z85828 Personal history of other malignant neoplasm of skin: Secondary | ICD-10-CM | POA: Diagnosis not present

## 2014-01-01 DIAGNOSIS — S336XXA Sprain of sacroiliac joint, initial encounter: Secondary | ICD-10-CM | POA: Diagnosis not present

## 2014-01-02 DIAGNOSIS — R5383 Other fatigue: Secondary | ICD-10-CM | POA: Diagnosis not present

## 2014-01-03 DIAGNOSIS — M47816 Spondylosis without myelopathy or radiculopathy, lumbar region: Secondary | ICD-10-CM | POA: Diagnosis not present

## 2014-01-03 DIAGNOSIS — S336XXA Sprain of sacroiliac joint, initial encounter: Secondary | ICD-10-CM | POA: Diagnosis not present

## 2014-01-03 DIAGNOSIS — M9903 Segmental and somatic dysfunction of lumbar region: Secondary | ICD-10-CM | POA: Diagnosis not present

## 2014-01-08 DIAGNOSIS — M47816 Spondylosis without myelopathy or radiculopathy, lumbar region: Secondary | ICD-10-CM | POA: Diagnosis not present

## 2014-01-08 DIAGNOSIS — M9903 Segmental and somatic dysfunction of lumbar region: Secondary | ICD-10-CM | POA: Diagnosis not present

## 2014-01-08 DIAGNOSIS — S336XXA Sprain of sacroiliac joint, initial encounter: Secondary | ICD-10-CM | POA: Diagnosis not present

## 2014-01-15 DIAGNOSIS — S336XXA Sprain of sacroiliac joint, initial encounter: Secondary | ICD-10-CM | POA: Diagnosis not present

## 2014-01-15 DIAGNOSIS — M47816 Spondylosis without myelopathy or radiculopathy, lumbar region: Secondary | ICD-10-CM | POA: Diagnosis not present

## 2014-01-15 DIAGNOSIS — M9903 Segmental and somatic dysfunction of lumbar region: Secondary | ICD-10-CM | POA: Diagnosis not present

## 2014-01-22 DIAGNOSIS — M9903 Segmental and somatic dysfunction of lumbar region: Secondary | ICD-10-CM | POA: Diagnosis not present

## 2014-01-22 DIAGNOSIS — M47816 Spondylosis without myelopathy or radiculopathy, lumbar region: Secondary | ICD-10-CM | POA: Diagnosis not present

## 2014-01-22 DIAGNOSIS — S336XXA Sprain of sacroiliac joint, initial encounter: Secondary | ICD-10-CM | POA: Diagnosis not present

## 2014-01-24 DIAGNOSIS — M9903 Segmental and somatic dysfunction of lumbar region: Secondary | ICD-10-CM | POA: Diagnosis not present

## 2014-01-24 DIAGNOSIS — M47816 Spondylosis without myelopathy or radiculopathy, lumbar region: Secondary | ICD-10-CM | POA: Diagnosis not present

## 2014-01-24 DIAGNOSIS — S336XXA Sprain of sacroiliac joint, initial encounter: Secondary | ICD-10-CM | POA: Diagnosis not present

## 2014-01-29 DIAGNOSIS — M9903 Segmental and somatic dysfunction of lumbar region: Secondary | ICD-10-CM | POA: Diagnosis not present

## 2014-01-29 DIAGNOSIS — M47816 Spondylosis without myelopathy or radiculopathy, lumbar region: Secondary | ICD-10-CM | POA: Diagnosis not present

## 2014-01-29 DIAGNOSIS — S336XXA Sprain of sacroiliac joint, initial encounter: Secondary | ICD-10-CM | POA: Diagnosis not present

## 2014-02-06 DIAGNOSIS — M47816 Spondylosis without myelopathy or radiculopathy, lumbar region: Secondary | ICD-10-CM | POA: Diagnosis not present

## 2014-02-06 DIAGNOSIS — S336XXA Sprain of sacroiliac joint, initial encounter: Secondary | ICD-10-CM | POA: Diagnosis not present

## 2014-02-06 DIAGNOSIS — M9903 Segmental and somatic dysfunction of lumbar region: Secondary | ICD-10-CM | POA: Diagnosis not present

## 2014-02-13 DIAGNOSIS — M9903 Segmental and somatic dysfunction of lumbar region: Secondary | ICD-10-CM | POA: Diagnosis not present

## 2014-02-13 DIAGNOSIS — S336XXA Sprain of sacroiliac joint, initial encounter: Secondary | ICD-10-CM | POA: Diagnosis not present

## 2014-02-13 DIAGNOSIS — M47816 Spondylosis without myelopathy or radiculopathy, lumbar region: Secondary | ICD-10-CM | POA: Diagnosis not present

## 2014-02-26 DIAGNOSIS — S336XXA Sprain of sacroiliac joint, initial encounter: Secondary | ICD-10-CM | POA: Diagnosis not present

## 2014-02-26 DIAGNOSIS — M9903 Segmental and somatic dysfunction of lumbar region: Secondary | ICD-10-CM | POA: Diagnosis not present

## 2014-02-26 DIAGNOSIS — M47816 Spondylosis without myelopathy or radiculopathy, lumbar region: Secondary | ICD-10-CM | POA: Diagnosis not present

## 2014-03-12 DIAGNOSIS — S336XXA Sprain of sacroiliac joint, initial encounter: Secondary | ICD-10-CM | POA: Diagnosis not present

## 2014-03-12 DIAGNOSIS — M9903 Segmental and somatic dysfunction of lumbar region: Secondary | ICD-10-CM | POA: Diagnosis not present

## 2014-03-12 DIAGNOSIS — M47816 Spondylosis without myelopathy or radiculopathy, lumbar region: Secondary | ICD-10-CM | POA: Diagnosis not present

## 2014-04-02 DIAGNOSIS — M47816 Spondylosis without myelopathy or radiculopathy, lumbar region: Secondary | ICD-10-CM | POA: Diagnosis not present

## 2014-04-02 DIAGNOSIS — S336XXA Sprain of sacroiliac joint, initial encounter: Secondary | ICD-10-CM | POA: Diagnosis not present

## 2014-04-02 DIAGNOSIS — M47812 Spondylosis without myelopathy or radiculopathy, cervical region: Secondary | ICD-10-CM | POA: Diagnosis not present

## 2014-04-02 DIAGNOSIS — M9903 Segmental and somatic dysfunction of lumbar region: Secondary | ICD-10-CM | POA: Diagnosis not present

## 2014-04-02 DIAGNOSIS — S134XXA Sprain of ligaments of cervical spine, initial encounter: Secondary | ICD-10-CM | POA: Diagnosis not present

## 2014-04-05 DIAGNOSIS — R5383 Other fatigue: Secondary | ICD-10-CM | POA: Diagnosis not present

## 2014-04-05 DIAGNOSIS — I10 Essential (primary) hypertension: Secondary | ICD-10-CM | POA: Diagnosis not present

## 2014-04-05 DIAGNOSIS — R109 Unspecified abdominal pain: Secondary | ICD-10-CM | POA: Diagnosis not present

## 2014-04-10 DIAGNOSIS — R109 Unspecified abdominal pain: Secondary | ICD-10-CM | POA: Diagnosis not present

## 2014-04-10 DIAGNOSIS — N289 Disorder of kidney and ureter, unspecified: Secondary | ICD-10-CM | POA: Diagnosis not present

## 2014-04-10 DIAGNOSIS — R935 Abnormal findings on diagnostic imaging of other abdominal regions, including retroperitoneum: Secondary | ICD-10-CM | POA: Diagnosis not present

## 2014-04-17 DIAGNOSIS — R102 Pelvic and perineal pain: Secondary | ICD-10-CM | POA: Diagnosis not present

## 2014-04-23 DIAGNOSIS — S336XXA Sprain of sacroiliac joint, initial encounter: Secondary | ICD-10-CM | POA: Diagnosis not present

## 2014-04-23 DIAGNOSIS — M47816 Spondylosis without myelopathy or radiculopathy, lumbar region: Secondary | ICD-10-CM | POA: Diagnosis not present

## 2014-04-23 DIAGNOSIS — M9903 Segmental and somatic dysfunction of lumbar region: Secondary | ICD-10-CM | POA: Diagnosis not present

## 2014-05-08 DIAGNOSIS — R102 Pelvic and perineal pain: Secondary | ICD-10-CM | POA: Diagnosis not present

## 2014-05-21 DIAGNOSIS — S336XXA Sprain of sacroiliac joint, initial encounter: Secondary | ICD-10-CM | POA: Diagnosis not present

## 2014-05-21 DIAGNOSIS — M9903 Segmental and somatic dysfunction of lumbar region: Secondary | ICD-10-CM | POA: Diagnosis not present

## 2014-05-21 DIAGNOSIS — M47816 Spondylosis without myelopathy or radiculopathy, lumbar region: Secondary | ICD-10-CM | POA: Diagnosis not present

## 2014-05-30 ENCOUNTER — Ambulatory Visit (INDEPENDENT_AMBULATORY_CARE_PROVIDER_SITE_OTHER): Payer: Medicare Other | Admitting: Psychiatry

## 2014-05-30 ENCOUNTER — Encounter (HOSPITAL_COMMUNITY): Payer: Self-pay | Admitting: Psychiatry

## 2014-05-30 VITALS — BP 116/69 | HR 59 | Ht 66.0 in | Wt 173.4 lb

## 2014-05-30 DIAGNOSIS — F329 Major depressive disorder, single episode, unspecified: Secondary | ICD-10-CM

## 2014-05-30 DIAGNOSIS — F419 Anxiety disorder, unspecified: Secondary | ICD-10-CM | POA: Diagnosis not present

## 2014-05-30 DIAGNOSIS — F32A Depression, unspecified: Secondary | ICD-10-CM

## 2014-05-30 MED ORDER — ESCITALOPRAM OXALATE 10 MG PO TABS: 10.0000 mg | ORAL_TABLET | Freq: Every day | ORAL | Status: DC

## 2014-05-30 MED ORDER — ALPRAZOLAM 0.5 MG PO TABS: 0.5000 mg | ORAL_TABLET | Freq: Every day | ORAL | Status: DC
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
## 2014-05-31 DIAGNOSIS — M7072 Other bursitis of hip, left hip: Secondary | ICD-10-CM | POA: Diagnosis not present

## 2014-06-04 ENCOUNTER — Telehealth (HOSPITAL_COMMUNITY): Payer: Self-pay | Admitting: *Deleted

## 2014-06-04 NOTE — Telephone Encounter (Signed)
Pt called stating that since she started taking her Lexapro, she have been nauseous, and very shaky to the point she can not hold a cup. Per pt she was taking her medication at 9pm but now she takes it at 7pm. Pt would like to know what to do and it she should just stop the Lexapro and only take the Xanax. Pt number is [phone] or cell number [phone].

## 2014-06-05 NOTE — Telephone Encounter (Signed)
Called pt and she is aware of what Dr. Harrington Challenger stated and showed understanding. Informed pt that Dr. Harrington Challenger would like for her to come in for a sooner appt but Dr. Harrington Challenger next available would be 06-26-14. Per pt it's only 7 days away so she will just keep her already scheduled appt.

## 2014-06-05 NOTE — Telephone Encounter (Signed)
Tell her to stop lexapro. She will need to come in

## 2014-06-18 DIAGNOSIS — S336XXA Sprain of sacroiliac joint, initial encounter: Secondary | ICD-10-CM | POA: Diagnosis not present

## 2014-06-18 DIAGNOSIS — M47816 Spondylosis without myelopathy or radiculopathy, lumbar region: Secondary | ICD-10-CM | POA: Diagnosis not present

## 2014-06-18 DIAGNOSIS — M9903 Segmental and somatic dysfunction of lumbar region: Secondary | ICD-10-CM | POA: Diagnosis not present

## 2014-06-21 ENCOUNTER — Encounter (HOSPITAL_COMMUNITY): Payer: Self-pay | Admitting: Psychiatry

## 2014-06-21 ENCOUNTER — Ambulatory Visit (INDEPENDENT_AMBULATORY_CARE_PROVIDER_SITE_OTHER): Payer: Medicare Other | Admitting: Psychiatry

## 2014-06-21 DIAGNOSIS — F419 Anxiety disorder, unspecified: Secondary | ICD-10-CM
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
## 2014-06-21 NOTE — Patient Instructions (Signed)
Discussed orally 

## 2014-06-26 DIAGNOSIS — Z1231 Encounter for screening mammogram for malignant neoplasm of breast: Secondary | ICD-10-CM | POA: Diagnosis not present

## 2014-07-02 DIAGNOSIS — D225 Melanocytic nevi of trunk: Secondary | ICD-10-CM | POA: Diagnosis not present

## 2014-07-02 DIAGNOSIS — L570 Actinic keratosis: Secondary | ICD-10-CM | POA: Diagnosis not present

## 2014-07-02 DIAGNOSIS — Z85828 Personal history of other malignant neoplasm of skin: Secondary | ICD-10-CM | POA: Diagnosis not present

## 2014-07-03 ENCOUNTER — Ambulatory Visit (INDEPENDENT_AMBULATORY_CARE_PROVIDER_SITE_OTHER): Payer: Medicare Other | Admitting: Psychiatry

## 2014-07-03 ENCOUNTER — Encounter (HOSPITAL_COMMUNITY): Payer: Self-pay | Admitting: Psychiatry

## 2014-07-03 VITALS — BP 140/80 | Ht 66.0 in | Wt 177.8 lb

## 2014-07-03 DIAGNOSIS — F419 Anxiety disorder, unspecified: Secondary | ICD-10-CM | POA: Diagnosis not present
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
## 2014-07-05 DIAGNOSIS — M7071 Other bursitis of hip, right hip: Secondary | ICD-10-CM | POA: Diagnosis not present

## 2014-07-09 DIAGNOSIS — I10 Essential (primary) hypertension: Secondary | ICD-10-CM | POA: Diagnosis not present

## 2014-07-13 ENCOUNTER — Ambulatory Visit (INDEPENDENT_AMBULATORY_CARE_PROVIDER_SITE_OTHER): Payer: Medicare Other | Admitting: Psychiatry

## 2014-07-13 DIAGNOSIS — F419 Anxiety disorder, unspecified: Secondary | ICD-10-CM | POA: Diagnosis not present

## 2014-07-13 NOTE — Progress Notes (Signed)
   THERAPIST PROGRESS NOTE  Session Time: Friday 07/13/2014 1:40 PM - 2:35 PM  Participation Level: Active  Behavioral Response: CasualAlertAnxious  Type of Therapy: Individual Therapy  Treatment Goals addressed: Improve ability to manage stress and anxiety  Interventions: Supportive  Summary: Erica Bennett is a 69 y.o. female who is referred for services by psychiatrist Dr. Harrington Challenger to improve coping skills. Patient reports husband has a history of alcohol abuse/dependence and  had a 3 year drunk which came to an end about this time last year.  Recently, husband started exhibiting symptoms that indicate he may have resumed alcohol use. Patient reports finding a bottle of vodka but husband says it is an old bottle and denies resuming alcohol use. Patient is experiencing anxiety and feelings of resentment and betrayal. She expresses frustration that husband is not honest. She reports her trust issues are even more intense since she is blind. Her mother contracted rubella when she was pregnant with her and patient developed congenital blindness that worsened in her 10s. She was totally blind by 30's.  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  Her husband is her only living relative.  She is actively participating with Al-Anon and talking with her priest for support.   Patient reports continued stress and anxiety since last session. She still suspects husband may have used alcohol a few weeks ago but also is worried that he may have some problems with his brain as he is aging, his perception of some words seem to be off, his speech is sometimes slurred, and she recently thought she heard him about to cry. She also has begun to suspect he may be using alcohol as he is expressing increased concerns about their finances. She reports he has spent around $600 per month on alcohol when he drank. Patient reports feeling very vulnerable when husband drinks. She reports  support from AL-Anon group but has little support beyond this group as she has lost several friends who became upset with patient because she remained with husband despite his alcoholism.    Suicidal/Homicidal: No  Therapist Response: Therapist works with patient to process feelings, gather more information regarding patient's symptoms and her relationship with husband, identify ways to cope when husband is drinking, identify possible resources she could use should she need to go away for a few days,   Plan: Return again in 2 weeks.  Diagnosis: Axis I: Anxiety Disorder NOS    Axis II: No diagnosis    Jabril Pursell, LCSW 07/13/2014

## 2014-07-13 NOTE — Patient Instructions (Signed)
Discussed orally 

## 2014-08-01 ENCOUNTER — Ambulatory Visit (HOSPITAL_COMMUNITY): Payer: Self-pay | Admitting: Psychiatry

## 2014-08-06 ENCOUNTER — Ambulatory Visit (INDEPENDENT_AMBULATORY_CARE_PROVIDER_SITE_OTHER): Payer: Medicare Other | Admitting: Psychiatry

## 2014-08-06 DIAGNOSIS — F419 Anxiety disorder, unspecified: Secondary | ICD-10-CM

## 2014-08-06 NOTE — Patient Instructions (Signed)
Discussed orally 

## 2014-08-06 NOTE — Progress Notes (Signed)
    THERAPIST PROGRESS NOTE  Session Time:  Monday 08/06/2014 4:00 PM - 4:55 PM  Participation Level: Active  Behavioral Response: CasualAlertAnxious  Type of Therapy: Individual Therapy  Treatment Goals:   1. Learn and implement coping skills to reduce and manage overall anxiety     2. Identify and implement behavioral strategies to increase involvement in activity and experienced social networking interaction  Treatment Goals addressed: 1,2  Interventions: Supportive  Summary: Erica Bennett is a 69 y.o. female who is referred for services by psychiatrist Dr. Harrington Challenger to improve coping skills. Patient reports husband has a history of alcohol abuse/dependence and  had a 3 year drunk which came to an end about this time last year.  Recently, husband started exhibiting symptoms that indicate he may have resumed alcohol use. Patient reports finding a bottle of vodka but husband says it is an old bottle and denies resuming alcohol use. Patient is experiencing anxiety and feelings of resentment and betrayal. She expresses frustration that husband is not honest. She reports her trust issues are even more intense since she is blind. Her mother contracted rubella when she was pregnant with her and patient developed congenital blindness that worsened in her 47s. She was totally blind by 30's.  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  Her husband is her only living relative.  She is actively participating with Al-Anon and talking with her priest for support.   Patient reports continued stress and anxiety since last session. Per her report, husband made decision to stop drinking on July 26, 2014 and went through DT's at home for 3 days. She reports things are better now but also says she is anticipating his resuming alcohol eventually. She has limited support system and reports all of her social contacts are through [facility] and [facility]. She states being tired and not having  as much zest and energy for life. She [facility]. She will attend this without husband and is looking forward to having time for self.    Suicidal/Homicidal: No  Therapist Response: Therapist works with patient to process feelings and develop treatment plan Plan: Return again in 2 weeks.  Diagnosis: Axis I: Anxiety Disorder NOS    Axis II: No diagnosis    Kanetra Ho, LCSW 08/06/2014

## 2014-08-20 ENCOUNTER — Ambulatory Visit (INDEPENDENT_AMBULATORY_CARE_PROVIDER_SITE_OTHER): Payer: Medicare Other | Admitting: Psychiatry

## 2014-08-20 DIAGNOSIS — F419 Anxiety disorder, unspecified: Secondary | ICD-10-CM | POA: Diagnosis not present
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
## 2014-08-20 NOTE — Patient Instructions (Signed)
Discussed orally 

## 2014-08-31 ENCOUNTER — Encounter (HOSPITAL_COMMUNITY): Payer: Self-pay | Admitting: Psychiatry

## 2014-08-31 ENCOUNTER — Ambulatory Visit (INDEPENDENT_AMBULATORY_CARE_PROVIDER_SITE_OTHER): Payer: Medicare Other | Admitting: Psychiatry

## 2014-08-31 VITALS — BP 112/55 | HR 68 | Ht 66.0 in | Wt 177.0 lb

## 2014-08-31 DIAGNOSIS — F419 Anxiety disorder, unspecified: Secondary | ICD-10-CM | POA: Diagnosis not present

## 2014-08-31 MED ORDER — ALPRAZOLAM 0.5 MG PO TABS: 0.5000 mg | ORAL_TABLET | Freq: Every day | ORAL | Status: DC
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
## 2014-09-11 ENCOUNTER — Ambulatory Visit (INDEPENDENT_AMBULATORY_CARE_PROVIDER_SITE_OTHER): Payer: Medicare Other | Admitting: Psychiatry

## 2014-09-11 DIAGNOSIS — F419 Anxiety disorder, unspecified: Secondary | ICD-10-CM

## 2014-09-11 NOTE — Patient Instructions (Signed)
Discussed orally 

## 2014-09-11 NOTE — Progress Notes (Signed)
     THERAPIST PROGRESS NOTE  Session Time:  Tuesday 09/11/2014 1:05 PM - 1:55 PM  Participation Level: Active  Behavioral Response: CasualAlertAnxious  Type of Therapy: Individual Therapy  Treatment Goals:   1. Learn and implement coping skills to reduce and manage overall anxiety     2. Identify and implement behavioral strategies to increase involvement in activity and experienced social networking interaction  Treatment Goals addressed: 1,2  Interventions: Supportive  Summary: Erica Bennett is a 69 y.o. female who is referred for services by psychiatrist Dr. Harrington Challenger to improve coping skills. Patient reports husband has a history of alcohol abuse/dependence and  had a 3 year drunk which came to an end about this time last year.  Recently, husband started exhibiting symptoms that indicate he may have resumed alcohol use. Patient reports finding a bottle of vodka but husband says it is an old bottle and denies resuming alcohol use. Patient is experiencing anxiety and feelings of resentment and betrayal. She expresses frustration that husband is not honest. She reports her trust issues are even more intense since she is blind. Her mother contracted rubella when she was pregnant with her and patient developed congenital blindness that worsened in her 64s. She was totally blind by 30's.  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  Her husband is her only living relative.  She is actively participating with Al-Anon and talking with her priest for support.   Patient reports decreased stress since last session as husband has not used any alcohol since then per her report. However, she remains anxious at times and anticipates her husband eventually relapsing. She states waiting for the other shoe to drop. She is pleased husband has made an appointment to see Dr. Sima Matas next week. She continues to attend Al-Anon Group. She discusses concerns today about  working on her goal of expanding social network. She reports some discomfort in trying to establish contacts beyond Al-Anon group as she states that these are the people who truly understand her situation and tolerate being involved with her as she has lost contact with other friends. She also admits concern about establishing contact due to being blind and shares more information about previous experiences .  Suicidal/Homicidal: No  Therapist Response: Therapist works with patient to process feelings, discuss effects of blindness on her interaction with others, identify ways to talk about her blindness in interaction with others, identify potential people to initiate contact  Plan: Return again in 2 weeks. Patient plans to initiate contact with some of the [facility]  Diagnosis: Axis I: Anxiety Disorder NOS    Axis II: No diagnosis    Arriyah Madej, LCSW 09/11/2014

## 2014-09-26 ENCOUNTER — Encounter (HOSPITAL_COMMUNITY): Payer: Self-pay | Admitting: Psychiatry

## 2014-09-26 ENCOUNTER — Ambulatory Visit (INDEPENDENT_AMBULATORY_CARE_PROVIDER_SITE_OTHER): Payer: Medicare Other | Admitting: Psychiatry

## 2014-09-26 DIAGNOSIS — F419 Anxiety disorder, unspecified: Secondary | ICD-10-CM

## 2014-09-26 NOTE — Progress Notes (Signed)
      THERAPIST PROGRESS NOTE  Session Time:  Wednesday 09/26/2014 2:15 PM - 3:10 PM  Participation Level: Active  Behavioral Response: CasualAlertAnxious  Type of Therapy: Individual Therapy  Treatment Goals:   1. Learn and implement coping skills to reduce and manage overall anxiety     2. Identify and implement behavioral strategies to increase involvement in activity and experienced social networking interaction  Treatment Goals addressed: 1,2  Interventions: Supportive  Summary: Erica Bennett is a 69 y.o. female who is referred for services by psychiatrist Dr. Harrington Challenger to improve coping skills. Patient reports husband has a history of alcohol abuse/dependence and  had a 3 year drunk which came to an end about this time last year.  Recently, husband started exhibiting symptoms that indicate he may have resumed alcohol use. Patient reports finding a bottle of vodka but husband says it is an old bottle and denies resuming alcohol use. Patient is experiencing anxiety and feelings of resentment and betrayal. She expresses frustration that husband is not honest. She reports her trust issues are even more intense since she is blind. Her mother contracted rubella when she was pregnant with her and patient developed congenital blindness that worsened in her 27s. She was totally blind by 30's.  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  Her husband is her only living relative.  She is actively participating with Al-Anon and talking with her priest for support.   Patient reports continued decreased stress since last session as husband has not used any alcohol since then per her report. She is less preoccupied with thoughts of husband having a relapse and is pleased she has begun seeing Dr. Sima Matas. She is excited she and husband had a date and went to the movies last night. Patient also reports she initiated contact with an acquaintance that is not  associated with the Al-Anon group. She reports this contact was very positive and is looking forward to future interaction. Patient is looking forward to going away for a weekend next month with two members of the [place] group. She has some concerns that husband may relapse while she is away but has already made plans as to how she will take care of self should this happen. Patient continues to maintain involvement in activity.  .  Suicidal/Homicidal: No  Therapist Response: Therapist works with patient to process feelings, discuss effects initiating contact with others to expand support system, encourage patient to continue efforts, identify coping statements,   Plan: Return again in 2 weeks.   Diagnosis: Axis I: Anxiety Disorder NOS    Axis II: No diagnosis    Leigh Kaeding, LCSW 09/26/2014

## 2014-09-26 NOTE — Patient Instructions (Signed)
Discussed orally 

## 2014-10-10 ENCOUNTER — Ambulatory Visit (INDEPENDENT_AMBULATORY_CARE_PROVIDER_SITE_OTHER): Payer: Medicare Other | Admitting: Psychiatry

## 2014-10-10 ENCOUNTER — Encounter (HOSPITAL_COMMUNITY): Payer: Self-pay | Admitting: Psychiatry

## 2014-10-10 DIAGNOSIS — F419 Anxiety disorder, unspecified: Secondary | ICD-10-CM

## 2014-10-10 DIAGNOSIS — F329 Major depressive disorder, single episode, unspecified: Secondary | ICD-10-CM | POA: Diagnosis not present

## 2014-10-10 DIAGNOSIS — F32A Depression, unspecified: Secondary | ICD-10-CM

## 2014-10-10 NOTE — Patient Instructions (Signed)
Discussed orally 

## 2014-10-10 NOTE — Progress Notes (Signed)
      THERAPIST PROGRESS NOTE  Session Time:  Wednesday 10/10/2014 2:10 PM - 2:55 PM  Participation Level: Active  Behavioral Response: CasualAlertAnxious  Type of Therapy: Individual Therapy  Treatment Goals:   1. Learn and implement coping skills to reduce and manage overall anxiety     2. Identify and implement behavioral strategies to increase involvement in activity and experienced social networking interaction  Treatment Goals addressed: 1,2  Interventions: Supportive  Summary: Erica Bennett is a 69 y.o. female who is referred for services by psychiatrist Dr. Harrington Challenger to improve coping skills. Patient reports husband has a history of alcohol abuse/dependence and  had a 3 year drunk which came to an end about this time last year.  Recently, husband started exhibiting symptoms that indicate he may have resumed alcohol use. Patient reports finding a bottle of vodka but husband says it is an old bottle and denies resuming alcohol use. Patient is experiencing anxiety and feelings of resentment and betrayal. She expresses frustration that husband is not honest. She reports her trust issues are even more intense since she is blind. Her mother contracted rubella when she was pregnant with her and patient developed congenital blindness that worsened in her 32s. She was totally blind by 30's.  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  Her husband is her only living relative.  She is actively participating with Al-Anon and talking with her priest for support.   Patient reports continued decreased stress since last session as husband has continued to abstain from alcohol use per her report. They are experiencing improvement in their relationship. She reports husband is less argumentative and more pleasant. She states no longer feeling as though she is walking on egg shells. She is planning to go to beach this weekend with two members of the [place] group.  She continues to have concerns that husband may relapse while she is away but has already made plans as to how she will take care of self should this happen. She has been using distracting activities when she starts thinking about husband's possible relapse and says this has been helpful. She has continued to develop a relationship with the acquaintance that is not associated with the Al-Anon group and reports this has been positive.   Suicidal/Homicidal: No  Therapist Response: Therapist works with patient to process feelings, encourage patient to continue efforts, identify coping statements,   Plan: Return again in 3 weeks.   Diagnosis: Axis I: Anxiety Disorder NOS    Axis II: No diagnosis    Kari Montero, LCSW 10/10/2014

## 2014-10-22 DIAGNOSIS — I10 Essential (primary) hypertension: Secondary | ICD-10-CM | POA: Diagnosis not present

## 2014-10-22 DIAGNOSIS — R51 Headache: Secondary | ICD-10-CM | POA: Diagnosis not present

## 2014-10-22 DIAGNOSIS — Z1389 Encounter for screening for other disorder: Secondary | ICD-10-CM | POA: Diagnosis not present

## 2014-10-22 DIAGNOSIS — Z0000 Encounter for general adult medical examination without abnormal findings: Secondary | ICD-10-CM | POA: Diagnosis not present

## 2014-10-22 DIAGNOSIS — R5383 Other fatigue: Secondary | ICD-10-CM | POA: Diagnosis not present

## 2014-10-25 DIAGNOSIS — M7071 Other bursitis of hip, right hip: Secondary | ICD-10-CM | POA: Diagnosis not present

## 2014-10-29 ENCOUNTER — Ambulatory Visit (INDEPENDENT_AMBULATORY_CARE_PROVIDER_SITE_OTHER): Payer: Medicare Other | Admitting: Psychiatry

## 2014-10-29 DIAGNOSIS — F419 Anxiety disorder, unspecified: Secondary | ICD-10-CM
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
## 2014-10-29 NOTE — Patient Instructions (Signed)
Discussed orally 

## 2014-10-31 ENCOUNTER — Telehealth (HOSPITAL_COMMUNITY): Payer: Self-pay | Admitting: *Deleted

## 2014-10-31 NOTE — Telephone Encounter (Signed)
Per pt, "she's holding down the fort at home due to family issues". Per pt, she just wanted Peggy to know that husband is in the hospital for known reasons. Informed pt that message will be given to [facility]

## 2014-11-06 DIAGNOSIS — B028 Zoster with other complications: Secondary | ICD-10-CM | POA: Diagnosis not present

## 2014-11-06 DIAGNOSIS — I10 Essential (primary) hypertension: Secondary | ICD-10-CM | POA: Diagnosis not present

## 2014-11-13 ENCOUNTER — Encounter (HOSPITAL_COMMUNITY): Payer: Self-pay | Admitting: Psychiatry

## 2014-11-13 ENCOUNTER — Ambulatory Visit (INDEPENDENT_AMBULATORY_CARE_PROVIDER_SITE_OTHER): Payer: Medicare Other | Admitting: Psychiatry

## 2014-11-13 DIAGNOSIS — F419 Anxiety disorder, unspecified: Secondary | ICD-10-CM
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
## 2014-11-13 NOTE — Patient Instructions (Signed)
Discussed orally 

## 2014-11-19 ENCOUNTER — Ambulatory Visit (HOSPITAL_COMMUNITY): Payer: Self-pay | Admitting: Psychiatry

## 2014-11-26 ENCOUNTER — Ambulatory Visit (INDEPENDENT_AMBULATORY_CARE_PROVIDER_SITE_OTHER): Payer: Medicare Other | Admitting: Psychiatry

## 2014-11-26 ENCOUNTER — Encounter (HOSPITAL_COMMUNITY): Payer: Self-pay | Admitting: Psychiatry

## 2014-11-26 VITALS — BP 123/63 | HR 71 | Ht 66.0 in | Wt 175.0 lb

## 2014-11-26 DIAGNOSIS — F419 Anxiety disorder, unspecified: Secondary | ICD-10-CM

## 2014-11-26 MED ORDER — ALPRAZOLAM 0.5 MG PO TABS: 0.5000 mg | ORAL_TABLET | Freq: Every day | ORAL | Status: DC
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
## 2014-11-27 DIAGNOSIS — M5126 Other intervertebral disc displacement, lumbar region: Secondary | ICD-10-CM | POA: Diagnosis not present

## 2014-11-27 DIAGNOSIS — M899 Disorder of bone, unspecified: Secondary | ICD-10-CM | POA: Diagnosis not present

## 2014-11-27 DIAGNOSIS — M5410 Radiculopathy, site unspecified: Secondary | ICD-10-CM | POA: Diagnosis not present

## 2014-11-28 DIAGNOSIS — I10 Essential (primary) hypertension: Secondary | ICD-10-CM | POA: Diagnosis not present

## 2014-11-28 DIAGNOSIS — Z780 Asymptomatic menopausal state: Secondary | ICD-10-CM | POA: Diagnosis not present

## 2014-11-28 DIAGNOSIS — M810 Age-related osteoporosis without current pathological fracture: Secondary | ICD-10-CM | POA: Diagnosis not present

## 2014-11-28 DIAGNOSIS — Z90710 Acquired absence of both cervix and uterus: Secondary | ICD-10-CM | POA: Diagnosis not present

## 2014-11-28 DIAGNOSIS — Z79899 Other long term (current) drug therapy: Secondary | ICD-10-CM | POA: Diagnosis not present

## 2014-11-28 DIAGNOSIS — Z0389 Encounter for observation for other suspected diseases and conditions ruled out: Secondary | ICD-10-CM | POA: Diagnosis not present

## 2014-11-28 DIAGNOSIS — Z13820 Encounter for screening for osteoporosis: Secondary | ICD-10-CM | POA: Diagnosis not present

## 2014-11-28 DIAGNOSIS — M8589 Other specified disorders of bone density and structure, multiple sites: Secondary | ICD-10-CM | POA: Diagnosis not present

## 2014-11-29 DIAGNOSIS — M76891 Other specified enthesopathies of right lower limb, excluding foot: Secondary | ICD-10-CM | POA: Diagnosis not present

## 2014-11-29 DIAGNOSIS — M545 Low back pain: Secondary | ICD-10-CM | POA: Diagnosis not present

## 2014-11-30 ENCOUNTER — Ambulatory Visit (HOSPITAL_COMMUNITY): Payer: Self-pay | Admitting: Psychiatry

## 2014-12-03 ENCOUNTER — Ambulatory Visit (HOSPITAL_COMMUNITY): Payer: Self-pay | Admitting: Psychiatry

## 2014-12-05 ENCOUNTER — Ambulatory Visit (INDEPENDENT_AMBULATORY_CARE_PROVIDER_SITE_OTHER): Payer: Medicare Other | Admitting: Psychiatry

## 2014-12-05 ENCOUNTER — Encounter (HOSPITAL_COMMUNITY): Payer: Self-pay | Admitting: Psychiatry

## 2014-12-05 DIAGNOSIS — M6281 Muscle weakness (generalized): Secondary | ICD-10-CM | POA: Diagnosis not present

## 2014-12-05 DIAGNOSIS — M545 Low back pain: Secondary | ICD-10-CM | POA: Diagnosis not present

## 2014-12-05 DIAGNOSIS — F419 Anxiety disorder, unspecified: Secondary | ICD-10-CM | POA: Diagnosis not present

## 2014-12-05 NOTE — Patient Instructions (Signed)
Discussed orally 

## 2014-12-05 NOTE — Progress Notes (Signed)
       THERAPIST PROGRESS NOTE  Session Time:    Wednesday 12/05/2014  2:05 PM -  2:50 PM           Participation Level: Active  Behavioral Response: CasualAlertAnxious  Type of Therapy: Individual Therapy  Treatment Goals:   1. Learn and implement coping skills to reduce and manage overall anxiety     2. Identify and implement behavioral strategies to increase involvement in activity and expand support system.  Treatment Goals addressed: 1,2  Interventions: Supportive  Summary: Erica Bennett is a 69 y.o. female who is referred for services by psychiatrist Dr. Harrington Challenger to improve coping skills. Patient reports husband has a history of alcohol abuse/dependence and  had a 3 year drunk which came to an end about this time last year.  Recently, husband started exhibiting symptoms that indicate he may have resumed alcohol use. Patient reports finding a bottle of vodka but husband says it is an old bottle and denies resuming alcohol use. Patient is experiencing anxiety and feelings of resentment and betrayal. She expresses frustration that husband is not honest. She reports her trust issues are even more intense since she is blind. Her mother contracted rubella when she was pregnant with her and patient developed congenital blindness that worsened in her 36s. She was totally blind by 30's.  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  Her husband is her only living relative.  She is actively participating with Al-Anon and talking with her priest for support.   Patient reports decreased stress, worry,  and anxiety since last session. She and husband recently attended an intensive marriage encounter program which was very helpful. Patient reports improved communication in their relationship. Husband has acknowledged the effect of his alcohol use on their marriage. He has maintained abstinence from alcohol since last session and has been attending AA along with  continuing to attend therapy consistently. He also has been going to Mass and working with their priest. Patient is pleased with husband's efforts but is cautiously hopeful about their marriage. She states leaving her options open and maintaining her independence. She is increasing efforts to expand support system and has been socializing with friends    Suicidal/Homicidal: No  Therapist Response: Therapist works with patient to process feelings,  reinforce use of support system, praise and reinforce increased involvement in activity  Plan: Return again in 3 weeks.   Diagnosis: Axis I: Anxiety Disorder NOS    Axis II: No diagnosis    Geofrey Silliman, LCSW 12/05/2014

## 2014-12-06 DIAGNOSIS — M545 Low back pain: Secondary | ICD-10-CM | POA: Diagnosis not present

## 2014-12-06 DIAGNOSIS — M899 Disorder of bone, unspecified: Secondary | ICD-10-CM | POA: Diagnosis not present

## 2014-12-11 DIAGNOSIS — M6281 Muscle weakness (generalized): Secondary | ICD-10-CM | POA: Diagnosis not present

## 2014-12-11 DIAGNOSIS — M545 Low back pain: Secondary | ICD-10-CM | POA: Diagnosis not present

## 2014-12-13 DIAGNOSIS — M76891 Other specified enthesopathies of right lower limb, excluding foot: Secondary | ICD-10-CM | POA: Diagnosis not present

## 2014-12-17 DIAGNOSIS — M545 Low back pain: Secondary | ICD-10-CM | POA: Diagnosis not present

## 2014-12-17 DIAGNOSIS — M6281 Muscle weakness (generalized): Secondary | ICD-10-CM | POA: Diagnosis not present

## 2014-12-24 DIAGNOSIS — M545 Low back pain: Secondary | ICD-10-CM | POA: Diagnosis not present

## 2014-12-24 DIAGNOSIS — M6281 Muscle weakness (generalized): Secondary | ICD-10-CM | POA: Diagnosis not present

## 2014-12-26 DIAGNOSIS — M545 Low back pain: Secondary | ICD-10-CM | POA: Diagnosis not present

## 2014-12-26 DIAGNOSIS — M6281 Muscle weakness (generalized): Secondary | ICD-10-CM | POA: Diagnosis not present

## 2014-12-28 ENCOUNTER — Ambulatory Visit (HOSPITAL_COMMUNITY): Payer: Self-pay | Admitting: Psychiatry

## 2014-12-31 DIAGNOSIS — M545 Low back pain: Secondary | ICD-10-CM | POA: Diagnosis not present

## 2014-12-31 DIAGNOSIS — M6281 Muscle weakness (generalized): Secondary | ICD-10-CM | POA: Diagnosis not present

## 2015-01-02 DIAGNOSIS — M545 Low back pain: Secondary | ICD-10-CM | POA: Diagnosis not present

## 2015-01-02 DIAGNOSIS — M6281 Muscle weakness (generalized): Secondary | ICD-10-CM | POA: Diagnosis not present

## 2015-01-07 DIAGNOSIS — M6281 Muscle weakness (generalized): Secondary | ICD-10-CM | POA: Diagnosis not present

## 2015-01-07 DIAGNOSIS — M545 Low back pain: Secondary | ICD-10-CM | POA: Diagnosis not present

## 2015-01-08 ENCOUNTER — Encounter (HOSPITAL_COMMUNITY): Payer: Self-pay | Admitting: Psychiatry

## 2015-01-08 ENCOUNTER — Ambulatory Visit (INDEPENDENT_AMBULATORY_CARE_PROVIDER_SITE_OTHER): Payer: Medicare Other | Admitting: Psychiatry

## 2015-01-08 DIAGNOSIS — F419 Anxiety disorder, unspecified: Secondary | ICD-10-CM | POA: Diagnosis not present
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
## 2015-01-08 NOTE — Patient Instructions (Signed)
Discussed orally 

## 2015-01-09 DIAGNOSIS — M545 Low back pain: Secondary | ICD-10-CM | POA: Diagnosis not present

## 2015-01-09 DIAGNOSIS — M6281 Muscle weakness (generalized): Secondary | ICD-10-CM | POA: Diagnosis not present

## 2015-01-14 DIAGNOSIS — M6281 Muscle weakness (generalized): Secondary | ICD-10-CM | POA: Diagnosis not present

## 2015-01-14 DIAGNOSIS — M545 Low back pain: Secondary | ICD-10-CM | POA: Diagnosis not present

## 2015-01-16 DIAGNOSIS — M545 Low back pain: Secondary | ICD-10-CM | POA: Diagnosis not present

## 2015-01-16 DIAGNOSIS — M6281 Muscle weakness (generalized): Secondary | ICD-10-CM | POA: Diagnosis not present

## 2015-01-17 DIAGNOSIS — M76891 Other specified enthesopathies of right lower limb, excluding foot: Secondary | ICD-10-CM | POA: Diagnosis not present

## 2015-01-22 DIAGNOSIS — K210 Gastro-esophageal reflux disease with esophagitis: Secondary | ICD-10-CM | POA: Diagnosis not present

## 2015-01-22 DIAGNOSIS — I10 Essential (primary) hypertension: Secondary | ICD-10-CM | POA: Diagnosis not present

## 2015-02-05 ENCOUNTER — Ambulatory Visit (INDEPENDENT_AMBULATORY_CARE_PROVIDER_SITE_OTHER): Payer: Medicare Other | Admitting: Psychiatry

## 2015-02-05 ENCOUNTER — Encounter (HOSPITAL_COMMUNITY): Payer: Self-pay | Admitting: Psychiatry

## 2015-02-05 DIAGNOSIS — F419 Anxiety disorder, unspecified: Secondary | ICD-10-CM

## 2015-02-05 NOTE — Patient Instructions (Signed)
Discussed orally 

## 2015-02-05 NOTE — Progress Notes (Signed)
        THERAPIST PROGRESS NOTE  Session Time:    Tuesday 02/04/2014 2:08 PM -  2:51 PM       Participation Level: Active  Behavioral Response: CasualAlertAnxious  Type of Therapy: Individual Therapy  Treatment Goals:   1. Learn and implement coping skills to reduce and manage overall anxiety     2. Identify and implement behavioral strategies to increase involvement in activity and expand support system.  Treatment Goals addressed: 1,2  Interventions: Supportive  Summary: Erica Bennett is a 70 y.o. female who is referred for services by psychiatrist Dr. Harrington Challenger to improve coping skills. Patient reports husband has a history of alcohol abuse/dependence and  had a 3 year drunk which came to an end about this time last year.  Recently, husband started exhibiting symptoms that indicate he may have resumed alcohol use. Patient reports finding a bottle of vodka but husband says it is an old bottle and denies resuming alcohol use. Patient is experiencing anxiety and feelings of resentment and betrayal. She expresses frustration that husband is not honest. She reports her trust issues are even more intense since she is blind. Her mother contracted rubella when she was pregnant with her and patient developed congenital blindness that worsened in her 60s. She was totally blind by 30's.  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  Her husband is her only living relative.  She is actively participating with Al-Anon and talking with her priest for support.   Patient reports continued decreased stress, worry,  and anxiety since last session. She and husband enjoyed the holidays. She is please that he has not resumed alcohol use to the best of her knowledge since last session. Patient continues to maintain contact with her support group including sponsor from [place], her church and acquaintances. She also maintains involvement in activity. She remains aware  husband could relapse but also remains aware of coping skills and her options. She also is pleased her husband remains in therapy.   Suicidal/Homicidal: No  Therapist Response: Therapist works with patient to process feelings, praise and reinforce use of coping skills and  support system, discuss progress on goals, discuss termination  Plan: Therapist and patient agree to terminate services as patient has accomplished her goals. Patient will continue to see psychiatrist Dr.Ross for medication management. Patient is encouraged to call this practice should she need therapy services in the future.    Diagnosis: Axis I: Anxiety Disorder NOS    Axis II: No diagnosis    Troye Hiemstra, LCSW 02/05/2015         Outpatient Therapist Discharge Summary  [name]    May 13, 1945   Admission Date:   06/21/2014 Discharge Date:  02/05/2015 Reason for Discharge:  Treatment completed Diagnosis:  Axis I:  Anxiety Disorder NOS    Axis V:  71-80  Comments:  Patient will continue to see psychiatrist Dr.Ross for medication management. Patient is encouraged to call this practice should she need therapy services in the future.    Riah Kehoe E Cullin Dishman LCSW

## 2015-02-11 DIAGNOSIS — M9903 Segmental and somatic dysfunction of lumbar region: Secondary | ICD-10-CM | POA: Diagnosis not present

## 2015-02-11 DIAGNOSIS — S134XXA Sprain of ligaments of cervical spine, initial encounter: Secondary | ICD-10-CM | POA: Diagnosis not present

## 2015-02-11 DIAGNOSIS — S336XXA Sprain of sacroiliac joint, initial encounter: Secondary | ICD-10-CM | POA: Diagnosis not present

## 2015-02-11 DIAGNOSIS — M47816 Spondylosis without myelopathy or radiculopathy, lumbar region: Secondary | ICD-10-CM | POA: Diagnosis not present

## 2015-02-13 DIAGNOSIS — S134XXA Sprain of ligaments of cervical spine, initial encounter: Secondary | ICD-10-CM | POA: Diagnosis not present

## 2015-02-13 DIAGNOSIS — S336XXA Sprain of sacroiliac joint, initial encounter: Secondary | ICD-10-CM | POA: Diagnosis not present

## 2015-02-13 DIAGNOSIS — M47816 Spondylosis without myelopathy or radiculopathy, lumbar region: Secondary | ICD-10-CM | POA: Diagnosis not present

## 2015-02-13 DIAGNOSIS — M9903 Segmental and somatic dysfunction of lumbar region: Secondary | ICD-10-CM | POA: Diagnosis not present

## 2015-02-15 DIAGNOSIS — M9903 Segmental and somatic dysfunction of lumbar region: Secondary | ICD-10-CM | POA: Diagnosis not present

## 2015-02-15 DIAGNOSIS — M47816 Spondylosis without myelopathy or radiculopathy, lumbar region: Secondary | ICD-10-CM | POA: Diagnosis not present

## 2015-02-15 DIAGNOSIS — S134XXA Sprain of ligaments of cervical spine, initial encounter: Secondary | ICD-10-CM | POA: Diagnosis not present

## 2015-02-15 DIAGNOSIS — S336XXA Sprain of sacroiliac joint, initial encounter: Secondary | ICD-10-CM | POA: Diagnosis not present

## 2015-02-19 DIAGNOSIS — M47816 Spondylosis without myelopathy or radiculopathy, lumbar region: Secondary | ICD-10-CM | POA: Diagnosis not present

## 2015-02-19 DIAGNOSIS — S134XXA Sprain of ligaments of cervical spine, initial encounter: Secondary | ICD-10-CM | POA: Diagnosis not present

## 2015-02-19 DIAGNOSIS — M9903 Segmental and somatic dysfunction of lumbar region: Secondary | ICD-10-CM | POA: Diagnosis not present

## 2015-02-19 DIAGNOSIS — M47812 Spondylosis without myelopathy or radiculopathy, cervical region: Secondary | ICD-10-CM | POA: Diagnosis not present

## 2015-02-19 DIAGNOSIS — S336XXA Sprain of sacroiliac joint, initial encounter: Secondary | ICD-10-CM | POA: Diagnosis not present

## 2015-02-21 DIAGNOSIS — S134XXA Sprain of ligaments of cervical spine, initial encounter: Secondary | ICD-10-CM | POA: Diagnosis not present

## 2015-02-21 DIAGNOSIS — M9903 Segmental and somatic dysfunction of lumbar region: Secondary | ICD-10-CM | POA: Diagnosis not present

## 2015-02-21 DIAGNOSIS — S336XXA Sprain of sacroiliac joint, initial encounter: Secondary | ICD-10-CM | POA: Diagnosis not present

## 2015-02-21 DIAGNOSIS — M47816 Spondylosis without myelopathy or radiculopathy, lumbar region: Secondary | ICD-10-CM | POA: Diagnosis not present

## 2015-02-21 DIAGNOSIS — M47812 Spondylosis without myelopathy or radiculopathy, cervical region: Secondary | ICD-10-CM | POA: Diagnosis not present

## 2015-02-25 DIAGNOSIS — M9901 Segmental and somatic dysfunction of cervical region: Secondary | ICD-10-CM | POA: Diagnosis not present

## 2015-02-25 DIAGNOSIS — M531 Cervicobrachial syndrome: Secondary | ICD-10-CM | POA: Diagnosis not present

## 2015-02-25 DIAGNOSIS — M47812 Spondylosis without myelopathy or radiculopathy, cervical region: Secondary | ICD-10-CM | POA: Diagnosis not present

## 2015-02-26 ENCOUNTER — Encounter (HOSPITAL_COMMUNITY): Payer: Self-pay | Admitting: Psychiatry

## 2015-02-26 ENCOUNTER — Ambulatory Visit (INDEPENDENT_AMBULATORY_CARE_PROVIDER_SITE_OTHER): Payer: Medicare Other | Admitting: Psychiatry

## 2015-02-26 VITALS — BP 112/62 | HR 62 | Ht 66.0 in | Wt 178.0 lb

## 2015-02-26 DIAGNOSIS — F419 Anxiety disorder, unspecified: Secondary | ICD-10-CM

## 2015-02-26 MED ORDER — ALPRAZOLAM 0.5 MG PO TABS: 0.5000 mg | ORAL_TABLET | Freq: Every day | ORAL | Status: DC
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
## 2015-02-28 DIAGNOSIS — L308 Other specified dermatitis: Secondary | ICD-10-CM | POA: Diagnosis not present

## 2015-02-28 DIAGNOSIS — L52 Erythema nodosum: Secondary | ICD-10-CM | POA: Diagnosis not present

## 2015-02-28 DIAGNOSIS — L309 Dermatitis, unspecified: Secondary | ICD-10-CM | POA: Diagnosis not present

## 2015-03-04 DIAGNOSIS — M9901 Segmental and somatic dysfunction of cervical region: Secondary | ICD-10-CM | POA: Diagnosis not present

## 2015-03-04 DIAGNOSIS — M47812 Spondylosis without myelopathy or radiculopathy, cervical region: Secondary | ICD-10-CM | POA: Diagnosis not present

## 2015-03-04 DIAGNOSIS — M531 Cervicobrachial syndrome: Secondary | ICD-10-CM | POA: Diagnosis not present

## 2015-03-06 DIAGNOSIS — M47812 Spondylosis without myelopathy or radiculopathy, cervical region: Secondary | ICD-10-CM | POA: Diagnosis not present

## 2015-03-06 DIAGNOSIS — M9901 Segmental and somatic dysfunction of cervical region: Secondary | ICD-10-CM | POA: Diagnosis not present

## 2015-03-06 DIAGNOSIS — M531 Cervicobrachial syndrome: Secondary | ICD-10-CM | POA: Diagnosis not present

## 2015-03-08 DIAGNOSIS — M47812 Spondylosis without myelopathy or radiculopathy, cervical region: Secondary | ICD-10-CM | POA: Diagnosis not present

## 2015-03-08 DIAGNOSIS — M531 Cervicobrachial syndrome: Secondary | ICD-10-CM | POA: Diagnosis not present

## 2015-03-08 DIAGNOSIS — M9901 Segmental and somatic dysfunction of cervical region: Secondary | ICD-10-CM | POA: Diagnosis not present

## 2015-03-11 DIAGNOSIS — M531 Cervicobrachial syndrome: Secondary | ICD-10-CM | POA: Diagnosis not present

## 2015-03-11 DIAGNOSIS — M47812 Spondylosis without myelopathy or radiculopathy, cervical region: Secondary | ICD-10-CM | POA: Diagnosis not present

## 2015-03-11 DIAGNOSIS — M9901 Segmental and somatic dysfunction of cervical region: Secondary | ICD-10-CM | POA: Diagnosis not present

## 2015-03-19 DIAGNOSIS — M9901 Segmental and somatic dysfunction of cervical region: Secondary | ICD-10-CM | POA: Diagnosis not present

## 2015-03-19 DIAGNOSIS — M47812 Spondylosis without myelopathy or radiculopathy, cervical region: Secondary | ICD-10-CM | POA: Diagnosis not present

## 2015-03-19 DIAGNOSIS — M47816 Spondylosis without myelopathy or radiculopathy, lumbar region: Secondary | ICD-10-CM | POA: Diagnosis not present

## 2015-03-19 DIAGNOSIS — M5441 Lumbago with sciatica, right side: Secondary | ICD-10-CM | POA: Diagnosis not present

## 2015-03-19 DIAGNOSIS — M531 Cervicobrachial syndrome: Secondary | ICD-10-CM | POA: Diagnosis not present

## 2015-04-09 DIAGNOSIS — M47812 Spondylosis without myelopathy or radiculopathy, cervical region: Secondary | ICD-10-CM | POA: Diagnosis not present

## 2015-04-09 DIAGNOSIS — M531 Cervicobrachial syndrome: Secondary | ICD-10-CM | POA: Diagnosis not present

## 2015-04-09 DIAGNOSIS — M9901 Segmental and somatic dysfunction of cervical region: Secondary | ICD-10-CM | POA: Diagnosis not present

## 2015-04-09 DIAGNOSIS — M47816 Spondylosis without myelopathy or radiculopathy, lumbar region: Secondary | ICD-10-CM | POA: Diagnosis not present

## 2015-04-09 DIAGNOSIS — M5441 Lumbago with sciatica, right side: Secondary | ICD-10-CM | POA: Diagnosis not present

## 2015-04-22 DIAGNOSIS — M47812 Spondylosis without myelopathy or radiculopathy, cervical region: Secondary | ICD-10-CM | POA: Diagnosis not present

## 2015-04-22 DIAGNOSIS — M47816 Spondylosis without myelopathy or radiculopathy, lumbar region: Secondary | ICD-10-CM | POA: Diagnosis not present

## 2015-04-22 DIAGNOSIS — M9902 Segmental and somatic dysfunction of thoracic region: Secondary | ICD-10-CM | POA: Diagnosis not present

## 2015-04-22 DIAGNOSIS — S134XXA Sprain of ligaments of cervical spine, initial encounter: Secondary | ICD-10-CM | POA: Diagnosis not present

## 2015-04-22 DIAGNOSIS — M9903 Segmental and somatic dysfunction of lumbar region: Secondary | ICD-10-CM | POA: Diagnosis not present

## 2015-04-22 DIAGNOSIS — M531 Cervicobrachial syndrome: Secondary | ICD-10-CM | POA: Diagnosis not present

## 2015-04-22 DIAGNOSIS — M5441 Lumbago with sciatica, right side: Secondary | ICD-10-CM | POA: Diagnosis not present

## 2015-04-22 DIAGNOSIS — M546 Pain in thoracic spine: Secondary | ICD-10-CM | POA: Diagnosis not present

## 2015-04-22 DIAGNOSIS — M9901 Segmental and somatic dysfunction of cervical region: Secondary | ICD-10-CM | POA: Diagnosis not present

## 2015-04-23 DIAGNOSIS — I10 Essential (primary) hypertension: Secondary | ICD-10-CM | POA: Diagnosis not present

## 2015-04-23 DIAGNOSIS — K210 Gastro-esophageal reflux disease with esophagitis: Secondary | ICD-10-CM | POA: Diagnosis not present

## 2015-04-26 DIAGNOSIS — Z1159 Encounter for screening for other viral diseases: Secondary | ICD-10-CM | POA: Diagnosis not present

## 2015-04-26 DIAGNOSIS — I10 Essential (primary) hypertension: Secondary | ICD-10-CM | POA: Diagnosis not present

## 2015-04-29 DIAGNOSIS — M9901 Segmental and somatic dysfunction of cervical region: Secondary | ICD-10-CM | POA: Diagnosis not present

## 2015-04-29 DIAGNOSIS — M5441 Lumbago with sciatica, right side: Secondary | ICD-10-CM | POA: Diagnosis not present

## 2015-04-29 DIAGNOSIS — M9903 Segmental and somatic dysfunction of lumbar region: Secondary | ICD-10-CM | POA: Diagnosis not present

## 2015-04-29 DIAGNOSIS — M47816 Spondylosis without myelopathy or radiculopathy, lumbar region: Secondary | ICD-10-CM | POA: Diagnosis not present

## 2015-04-29 DIAGNOSIS — M531 Cervicobrachial syndrome: Secondary | ICD-10-CM | POA: Diagnosis not present

## 2015-04-29 DIAGNOSIS — M546 Pain in thoracic spine: Secondary | ICD-10-CM | POA: Diagnosis not present

## 2015-04-29 DIAGNOSIS — S134XXA Sprain of ligaments of cervical spine, initial encounter: Secondary | ICD-10-CM | POA: Diagnosis not present

## 2015-04-29 DIAGNOSIS — M47812 Spondylosis without myelopathy or radiculopathy, cervical region: Secondary | ICD-10-CM | POA: Diagnosis not present

## 2015-04-29 DIAGNOSIS — M9902 Segmental and somatic dysfunction of thoracic region: Secondary | ICD-10-CM | POA: Diagnosis not present

## 2015-05-08 DIAGNOSIS — M47816 Spondylosis without myelopathy or radiculopathy, lumbar region: Secondary | ICD-10-CM | POA: Diagnosis not present

## 2015-05-08 DIAGNOSIS — M9903 Segmental and somatic dysfunction of lumbar region: Secondary | ICD-10-CM | POA: Diagnosis not present

## 2015-05-08 DIAGNOSIS — M5441 Lumbago with sciatica, right side: Secondary | ICD-10-CM | POA: Diagnosis not present

## 2015-05-08 DIAGNOSIS — M531 Cervicobrachial syndrome: Secondary | ICD-10-CM | POA: Diagnosis not present

## 2015-05-08 DIAGNOSIS — S134XXA Sprain of ligaments of cervical spine, initial encounter: Secondary | ICD-10-CM | POA: Diagnosis not present

## 2015-05-08 DIAGNOSIS — M9902 Segmental and somatic dysfunction of thoracic region: Secondary | ICD-10-CM | POA: Diagnosis not present

## 2015-05-08 DIAGNOSIS — M546 Pain in thoracic spine: Secondary | ICD-10-CM | POA: Diagnosis not present

## 2015-05-08 DIAGNOSIS — M47812 Spondylosis without myelopathy or radiculopathy, cervical region: Secondary | ICD-10-CM | POA: Diagnosis not present

## 2015-05-08 DIAGNOSIS — M9901 Segmental and somatic dysfunction of cervical region: Secondary | ICD-10-CM | POA: Diagnosis not present

## 2015-05-28 DIAGNOSIS — M9901 Segmental and somatic dysfunction of cervical region: Secondary | ICD-10-CM | POA: Diagnosis not present

## 2015-05-28 DIAGNOSIS — M47812 Spondylosis without myelopathy or radiculopathy, cervical region: Secondary | ICD-10-CM | POA: Diagnosis not present

## 2015-05-28 DIAGNOSIS — M47816 Spondylosis without myelopathy or radiculopathy, lumbar region: Secondary | ICD-10-CM | POA: Diagnosis not present

## 2015-05-28 DIAGNOSIS — M5441 Lumbago with sciatica, right side: Secondary | ICD-10-CM | POA: Diagnosis not present

## 2015-05-28 DIAGNOSIS — S134XXA Sprain of ligaments of cervical spine, initial encounter: Secondary | ICD-10-CM | POA: Diagnosis not present

## 2015-05-28 DIAGNOSIS — M9902 Segmental and somatic dysfunction of thoracic region: Secondary | ICD-10-CM | POA: Diagnosis not present

## 2015-05-28 DIAGNOSIS — M531 Cervicobrachial syndrome: Secondary | ICD-10-CM | POA: Diagnosis not present

## 2015-05-28 DIAGNOSIS — M546 Pain in thoracic spine: Secondary | ICD-10-CM | POA: Diagnosis not present

## 2015-05-28 DIAGNOSIS — M9903 Segmental and somatic dysfunction of lumbar region: Secondary | ICD-10-CM | POA: Diagnosis not present

## 2015-06-25 DIAGNOSIS — M9902 Segmental and somatic dysfunction of thoracic region: Secondary | ICD-10-CM | POA: Diagnosis not present

## 2015-06-25 DIAGNOSIS — M5441 Lumbago with sciatica, right side: Secondary | ICD-10-CM | POA: Diagnosis not present

## 2015-06-25 DIAGNOSIS — M47816 Spondylosis without myelopathy or radiculopathy, lumbar region: Secondary | ICD-10-CM | POA: Diagnosis not present

## 2015-06-25 DIAGNOSIS — M531 Cervicobrachial syndrome: Secondary | ICD-10-CM | POA: Diagnosis not present

## 2015-06-25 DIAGNOSIS — M9901 Segmental and somatic dysfunction of cervical region: Secondary | ICD-10-CM | POA: Diagnosis not present

## 2015-06-25 DIAGNOSIS — M9903 Segmental and somatic dysfunction of lumbar region: Secondary | ICD-10-CM | POA: Diagnosis not present

## 2015-06-25 DIAGNOSIS — M47812 Spondylosis without myelopathy or radiculopathy, cervical region: Secondary | ICD-10-CM | POA: Diagnosis not present

## 2015-06-25 DIAGNOSIS — M546 Pain in thoracic spine: Secondary | ICD-10-CM | POA: Diagnosis not present

## 2015-06-25 DIAGNOSIS — S134XXA Sprain of ligaments of cervical spine, initial encounter: Secondary | ICD-10-CM | POA: Diagnosis not present

## 2015-06-26 ENCOUNTER — Ambulatory Visit (INDEPENDENT_AMBULATORY_CARE_PROVIDER_SITE_OTHER): Payer: Medicare Other | Admitting: Psychiatry

## 2015-06-26 ENCOUNTER — Encounter (HOSPITAL_COMMUNITY): Payer: Self-pay | Admitting: Psychiatry

## 2015-06-26 VITALS — BP 138/83 | HR 62 | Ht 66.0 in | Wt 181.4 lb

## 2015-06-26 DIAGNOSIS — F419 Anxiety disorder, unspecified: Secondary | ICD-10-CM | POA: Diagnosis not present

## 2015-06-26 MED ORDER — ALPRAZOLAM 0.5 MG PO TABS: 0.5000 mg | ORAL_TABLET | Freq: Every day | ORAL | Status: DC
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
## 2015-07-11 DIAGNOSIS — K210 Gastro-esophageal reflux disease with esophagitis: Secondary | ICD-10-CM | POA: Diagnosis not present

## 2015-07-11 DIAGNOSIS — I10 Essential (primary) hypertension: Secondary | ICD-10-CM | POA: Diagnosis not present

## 2015-07-16 DIAGNOSIS — Z6829 Body mass index (BMI) 29.0-29.9, adult: Secondary | ICD-10-CM | POA: Diagnosis not present

## 2015-07-16 DIAGNOSIS — N898 Other specified noninflammatory disorders of vagina: Secondary | ICD-10-CM | POA: Diagnosis not present

## 2015-07-22 DIAGNOSIS — M9902 Segmental and somatic dysfunction of thoracic region: Secondary | ICD-10-CM | POA: Diagnosis not present

## 2015-07-22 DIAGNOSIS — M5441 Lumbago with sciatica, right side: Secondary | ICD-10-CM | POA: Diagnosis not present

## 2015-07-22 DIAGNOSIS — S134XXA Sprain of ligaments of cervical spine, initial encounter: Secondary | ICD-10-CM | POA: Diagnosis not present

## 2015-07-22 DIAGNOSIS — M47816 Spondylosis without myelopathy or radiculopathy, lumbar region: Secondary | ICD-10-CM | POA: Diagnosis not present

## 2015-07-22 DIAGNOSIS — M9903 Segmental and somatic dysfunction of lumbar region: Secondary | ICD-10-CM | POA: Diagnosis not present

## 2015-07-22 DIAGNOSIS — M531 Cervicobrachial syndrome: Secondary | ICD-10-CM | POA: Diagnosis not present

## 2015-07-22 DIAGNOSIS — M9901 Segmental and somatic dysfunction of cervical region: Secondary | ICD-10-CM | POA: Diagnosis not present

## 2015-07-22 DIAGNOSIS — M546 Pain in thoracic spine: Secondary | ICD-10-CM | POA: Diagnosis not present

## 2015-07-22 DIAGNOSIS — M47812 Spondylosis without myelopathy or radiculopathy, cervical region: Secondary | ICD-10-CM | POA: Diagnosis not present

## 2015-07-25 DIAGNOSIS — F330 Major depressive disorder, recurrent, mild: Secondary | ICD-10-CM | POA: Diagnosis not present

## 2015-07-25 DIAGNOSIS — I10 Essential (primary) hypertension: Secondary | ICD-10-CM | POA: Diagnosis not present

## 2015-07-25 DIAGNOSIS — K210 Gastro-esophageal reflux disease with esophagitis: Secondary | ICD-10-CM | POA: Diagnosis not present

## 2015-08-20 DIAGNOSIS — M5441 Lumbago with sciatica, right side: Secondary | ICD-10-CM | POA: Diagnosis not present

## 2015-08-20 DIAGNOSIS — M9903 Segmental and somatic dysfunction of lumbar region: Secondary | ICD-10-CM | POA: Diagnosis not present

## 2015-08-20 DIAGNOSIS — S134XXA Sprain of ligaments of cervical spine, initial encounter: Secondary | ICD-10-CM | POA: Diagnosis not present

## 2015-08-20 DIAGNOSIS — M47812 Spondylosis without myelopathy or radiculopathy, cervical region: Secondary | ICD-10-CM | POA: Diagnosis not present

## 2015-08-20 DIAGNOSIS — M531 Cervicobrachial syndrome: Secondary | ICD-10-CM | POA: Diagnosis not present

## 2015-08-20 DIAGNOSIS — M9902 Segmental and somatic dysfunction of thoracic region: Secondary | ICD-10-CM | POA: Diagnosis not present

## 2015-08-20 DIAGNOSIS — M9901 Segmental and somatic dysfunction of cervical region: Secondary | ICD-10-CM | POA: Diagnosis not present

## 2015-08-20 DIAGNOSIS — M546 Pain in thoracic spine: Secondary | ICD-10-CM | POA: Diagnosis not present

## 2015-08-20 DIAGNOSIS — M47816 Spondylosis without myelopathy or radiculopathy, lumbar region: Secondary | ICD-10-CM | POA: Diagnosis not present

## 2015-08-22 DIAGNOSIS — I10 Essential (primary) hypertension: Secondary | ICD-10-CM | POA: Diagnosis not present

## 2015-08-22 DIAGNOSIS — K210 Gastro-esophageal reflux disease with esophagitis: Secondary | ICD-10-CM | POA: Diagnosis not present

## 2015-08-22 DIAGNOSIS — F330 Major depressive disorder, recurrent, mild: Secondary | ICD-10-CM | POA: Diagnosis not present

## 2015-09-10 DIAGNOSIS — M9903 Segmental and somatic dysfunction of lumbar region: Secondary | ICD-10-CM | POA: Diagnosis not present

## 2015-09-10 DIAGNOSIS — M9901 Segmental and somatic dysfunction of cervical region: Secondary | ICD-10-CM | POA: Diagnosis not present

## 2015-09-10 DIAGNOSIS — M47812 Spondylosis without myelopathy or radiculopathy, cervical region: Secondary | ICD-10-CM | POA: Diagnosis not present

## 2015-09-10 DIAGNOSIS — M9902 Segmental and somatic dysfunction of thoracic region: Secondary | ICD-10-CM | POA: Diagnosis not present

## 2015-09-10 DIAGNOSIS — S134XXA Sprain of ligaments of cervical spine, initial encounter: Secondary | ICD-10-CM | POA: Diagnosis not present

## 2015-09-10 DIAGNOSIS — M546 Pain in thoracic spine: Secondary | ICD-10-CM | POA: Diagnosis not present

## 2015-09-10 DIAGNOSIS — M47816 Spondylosis without myelopathy or radiculopathy, lumbar region: Secondary | ICD-10-CM | POA: Diagnosis not present

## 2015-09-10 DIAGNOSIS — M5441 Lumbago with sciatica, right side: Secondary | ICD-10-CM | POA: Diagnosis not present

## 2015-09-10 DIAGNOSIS — M531 Cervicobrachial syndrome: Secondary | ICD-10-CM | POA: Diagnosis not present

## 2015-09-19 DIAGNOSIS — R928 Other abnormal and inconclusive findings on diagnostic imaging of breast: Secondary | ICD-10-CM | POA: Diagnosis not present

## 2015-09-19 DIAGNOSIS — Z1231 Encounter for screening mammogram for malignant neoplasm of breast: Secondary | ICD-10-CM | POA: Diagnosis not present

## 2015-09-25 ENCOUNTER — Other Ambulatory Visit (HOSPITAL_COMMUNITY): Payer: Self-pay | Admitting: Psychiatry

## 2015-09-25 DIAGNOSIS — R928 Other abnormal and inconclusive findings on diagnostic imaging of breast: Secondary | ICD-10-CM | POA: Diagnosis not present

## 2015-10-01 DIAGNOSIS — I10 Essential (primary) hypertension: Secondary | ICD-10-CM | POA: Diagnosis not present

## 2015-10-01 DIAGNOSIS — F330 Major depressive disorder, recurrent, mild: Secondary | ICD-10-CM | POA: Diagnosis not present

## 2015-10-01 DIAGNOSIS — K210 Gastro-esophageal reflux disease with esophagitis: Secondary | ICD-10-CM | POA: Diagnosis not present

## 2015-10-08 DIAGNOSIS — K219 Gastro-esophageal reflux disease without esophagitis: Secondary | ICD-10-CM | POA: Diagnosis not present

## 2015-10-09 DIAGNOSIS — M546 Pain in thoracic spine: Secondary | ICD-10-CM | POA: Diagnosis not present

## 2015-10-09 DIAGNOSIS — M47816 Spondylosis without myelopathy or radiculopathy, lumbar region: Secondary | ICD-10-CM | POA: Diagnosis not present

## 2015-10-09 DIAGNOSIS — M9901 Segmental and somatic dysfunction of cervical region: Secondary | ICD-10-CM | POA: Diagnosis not present

## 2015-10-09 DIAGNOSIS — M9903 Segmental and somatic dysfunction of lumbar region: Secondary | ICD-10-CM | POA: Diagnosis not present

## 2015-10-09 DIAGNOSIS — M9902 Segmental and somatic dysfunction of thoracic region: Secondary | ICD-10-CM | POA: Diagnosis not present

## 2015-10-09 DIAGNOSIS — S134XXA Sprain of ligaments of cervical spine, initial encounter: Secondary | ICD-10-CM | POA: Diagnosis not present

## 2015-10-09 DIAGNOSIS — M5441 Lumbago with sciatica, right side: Secondary | ICD-10-CM | POA: Diagnosis not present

## 2015-10-09 DIAGNOSIS — M531 Cervicobrachial syndrome: Secondary | ICD-10-CM | POA: Diagnosis not present

## 2015-10-09 DIAGNOSIS — M47812 Spondylosis without myelopathy or radiculopathy, cervical region: Secondary | ICD-10-CM | POA: Diagnosis not present

## 2015-10-22 DIAGNOSIS — K589 Irritable bowel syndrome without diarrhea: Secondary | ICD-10-CM | POA: Diagnosis not present

## 2015-10-22 DIAGNOSIS — Z882 Allergy status to sulfonamides status: Secondary | ICD-10-CM | POA: Diagnosis not present

## 2015-10-22 DIAGNOSIS — H540 Blindness, both eyes: Secondary | ICD-10-CM | POA: Diagnosis not present

## 2015-10-22 DIAGNOSIS — G40909 Epilepsy, unspecified, not intractable, without status epilepticus: Secondary | ICD-10-CM | POA: Diagnosis not present

## 2015-10-22 DIAGNOSIS — I10 Essential (primary) hypertension: Secondary | ICD-10-CM | POA: Diagnosis not present

## 2015-10-22 DIAGNOSIS — K219 Gastro-esophageal reflux disease without esophagitis: Secondary | ICD-10-CM | POA: Diagnosis not present

## 2015-10-22 DIAGNOSIS — Z86010 Personal history of colonic polyps: Secondary | ICD-10-CM | POA: Diagnosis not present

## 2015-10-22 DIAGNOSIS — F0390 Unspecified dementia without behavioral disturbance: Secondary | ICD-10-CM | POA: Diagnosis not present

## 2015-10-22 DIAGNOSIS — Z9049 Acquired absence of other specified parts of digestive tract: Secondary | ICD-10-CM | POA: Diagnosis not present

## 2015-10-22 DIAGNOSIS — K449 Diaphragmatic hernia without obstruction or gangrene: Secondary | ICD-10-CM | POA: Diagnosis not present

## 2015-10-22 DIAGNOSIS — G43909 Migraine, unspecified, not intractable, without status migrainosus: Secondary | ICD-10-CM | POA: Diagnosis not present

## 2015-10-22 DIAGNOSIS — Z7951 Long term (current) use of inhaled steroids: Secondary | ICD-10-CM | POA: Diagnosis not present

## 2015-10-22 DIAGNOSIS — Z8042 Family history of malignant neoplasm of prostate: Secondary | ICD-10-CM | POA: Diagnosis not present

## 2015-10-22 DIAGNOSIS — Z8489 Family history of other specified conditions: Secondary | ICD-10-CM | POA: Diagnosis not present

## 2015-10-22 DIAGNOSIS — E039 Hypothyroidism, unspecified: Secondary | ICD-10-CM | POA: Diagnosis not present

## 2015-10-22 DIAGNOSIS — Z79899 Other long term (current) drug therapy: Secondary | ICD-10-CM | POA: Diagnosis not present

## 2015-10-24 ENCOUNTER — Encounter (HOSPITAL_COMMUNITY): Payer: Self-pay | Admitting: Psychiatry

## 2015-10-24 ENCOUNTER — Ambulatory Visit (INDEPENDENT_AMBULATORY_CARE_PROVIDER_SITE_OTHER): Payer: Medicare Other | Admitting: Psychiatry

## 2015-10-24 VITALS — BP 134/83 | HR 67 | Ht 66.0 in | Wt 182.0 lb

## 2015-10-24 DIAGNOSIS — F419 Anxiety disorder, unspecified: Secondary | ICD-10-CM | POA: Diagnosis not present

## 2015-10-24 DIAGNOSIS — Z23 Encounter for immunization: Secondary | ICD-10-CM | POA: Diagnosis not present

## 2015-10-24 MED ORDER — ALPRAZOLAM 0.5 MG PO TABS: 0.5000 mg | ORAL_TABLET | Freq: Every day | ORAL | 3 refills | Status: DC
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
## 2015-10-25 DIAGNOSIS — M545 Low back pain: Secondary | ICD-10-CM | POA: Diagnosis not present

## 2015-10-25 DIAGNOSIS — K210 Gastro-esophageal reflux disease with esophagitis: Secondary | ICD-10-CM | POA: Diagnosis not present

## 2015-10-25 DIAGNOSIS — F330 Major depressive disorder, recurrent, mild: Secondary | ICD-10-CM | POA: Diagnosis not present

## 2015-10-25 DIAGNOSIS — I10 Essential (primary) hypertension: Secondary | ICD-10-CM | POA: Diagnosis not present

## 2015-10-30 DIAGNOSIS — S134XXA Sprain of ligaments of cervical spine, initial encounter: Secondary | ICD-10-CM | POA: Diagnosis not present

## 2015-10-30 DIAGNOSIS — M9901 Segmental and somatic dysfunction of cervical region: Secondary | ICD-10-CM | POA: Diagnosis not present

## 2015-10-30 DIAGNOSIS — M47812 Spondylosis without myelopathy or radiculopathy, cervical region: Secondary | ICD-10-CM | POA: Diagnosis not present

## 2015-10-30 DIAGNOSIS — M5441 Lumbago with sciatica, right side: Secondary | ICD-10-CM | POA: Diagnosis not present

## 2015-10-30 DIAGNOSIS — M9902 Segmental and somatic dysfunction of thoracic region: Secondary | ICD-10-CM | POA: Diagnosis not present

## 2015-10-30 DIAGNOSIS — M546 Pain in thoracic spine: Secondary | ICD-10-CM | POA: Diagnosis not present

## 2015-10-30 DIAGNOSIS — M9903 Segmental and somatic dysfunction of lumbar region: Secondary | ICD-10-CM | POA: Diagnosis not present

## 2015-10-30 DIAGNOSIS — M47816 Spondylosis without myelopathy or radiculopathy, lumbar region: Secondary | ICD-10-CM | POA: Diagnosis not present

## 2015-10-30 DIAGNOSIS — M531 Cervicobrachial syndrome: Secondary | ICD-10-CM | POA: Diagnosis not present

## 2015-11-13 DIAGNOSIS — M545 Low back pain: Secondary | ICD-10-CM | POA: Diagnosis not present

## 2015-11-13 DIAGNOSIS — I10 Essential (primary) hypertension: Secondary | ICD-10-CM | POA: Diagnosis not present

## 2015-11-13 DIAGNOSIS — K210 Gastro-esophageal reflux disease with esophagitis: Secondary | ICD-10-CM | POA: Diagnosis not present

## 2015-11-13 DIAGNOSIS — F330 Major depressive disorder, recurrent, mild: Secondary | ICD-10-CM | POA: Diagnosis not present

## 2015-11-20 DIAGNOSIS — M531 Cervicobrachial syndrome: Secondary | ICD-10-CM | POA: Diagnosis not present

## 2015-11-20 DIAGNOSIS — S134XXA Sprain of ligaments of cervical spine, initial encounter: Secondary | ICD-10-CM | POA: Diagnosis not present

## 2015-11-20 DIAGNOSIS — M9901 Segmental and somatic dysfunction of cervical region: Secondary | ICD-10-CM | POA: Diagnosis not present

## 2015-11-20 DIAGNOSIS — M47812 Spondylosis without myelopathy or radiculopathy, cervical region: Secondary | ICD-10-CM | POA: Diagnosis not present

## 2015-11-20 DIAGNOSIS — M47816 Spondylosis without myelopathy or radiculopathy, lumbar region: Secondary | ICD-10-CM | POA: Diagnosis not present

## 2015-11-20 DIAGNOSIS — M9902 Segmental and somatic dysfunction of thoracic region: Secondary | ICD-10-CM | POA: Diagnosis not present

## 2015-11-20 DIAGNOSIS — M9903 Segmental and somatic dysfunction of lumbar region: Secondary | ICD-10-CM | POA: Diagnosis not present

## 2015-11-20 DIAGNOSIS — M5441 Lumbago with sciatica, right side: Secondary | ICD-10-CM | POA: Diagnosis not present

## 2015-11-20 DIAGNOSIS — M546 Pain in thoracic spine: Secondary | ICD-10-CM | POA: Diagnosis not present

## 2015-12-10 DIAGNOSIS — Q111 Other anophthalmos: Secondary | ICD-10-CM | POA: Diagnosis not present

## 2015-12-10 DIAGNOSIS — H02831 Dermatochalasis of right upper eyelid: Secondary | ICD-10-CM | POA: Diagnosis not present

## 2015-12-10 DIAGNOSIS — H02834 Dermatochalasis of left upper eyelid: Secondary | ICD-10-CM | POA: Diagnosis not present

## 2015-12-10 DIAGNOSIS — H02413 Mechanical ptosis of bilateral eyelids: Secondary | ICD-10-CM | POA: Diagnosis not present

## 2015-12-11 DIAGNOSIS — M9903 Segmental and somatic dysfunction of lumbar region: Secondary | ICD-10-CM | POA: Diagnosis not present

## 2015-12-11 DIAGNOSIS — M546 Pain in thoracic spine: Secondary | ICD-10-CM | POA: Diagnosis not present

## 2015-12-11 DIAGNOSIS — M47816 Spondylosis without myelopathy or radiculopathy, lumbar region: Secondary | ICD-10-CM | POA: Diagnosis not present

## 2015-12-11 DIAGNOSIS — M9902 Segmental and somatic dysfunction of thoracic region: Secondary | ICD-10-CM | POA: Diagnosis not present

## 2015-12-11 DIAGNOSIS — M5441 Lumbago with sciatica, right side: Secondary | ICD-10-CM | POA: Diagnosis not present

## 2015-12-11 DIAGNOSIS — M9901 Segmental and somatic dysfunction of cervical region: Secondary | ICD-10-CM | POA: Diagnosis not present

## 2015-12-11 DIAGNOSIS — M47812 Spondylosis without myelopathy or radiculopathy, cervical region: Secondary | ICD-10-CM | POA: Diagnosis not present

## 2015-12-11 DIAGNOSIS — S134XXA Sprain of ligaments of cervical spine, initial encounter: Secondary | ICD-10-CM | POA: Diagnosis not present

## 2015-12-11 DIAGNOSIS — M531 Cervicobrachial syndrome: Secondary | ICD-10-CM | POA: Diagnosis not present

## 2015-12-13 DIAGNOSIS — I10 Essential (primary) hypertension: Secondary | ICD-10-CM | POA: Diagnosis not present

## 2015-12-13 DIAGNOSIS — M545 Low back pain: Secondary | ICD-10-CM | POA: Diagnosis not present

## 2015-12-13 DIAGNOSIS — F330 Major depressive disorder, recurrent, mild: Secondary | ICD-10-CM | POA: Diagnosis not present

## 2015-12-13 DIAGNOSIS — K210 Gastro-esophageal reflux disease with esophagitis: Secondary | ICD-10-CM | POA: Diagnosis not present

## 2015-12-27 DIAGNOSIS — M545 Low back pain: Secondary | ICD-10-CM | POA: Diagnosis not present

## 2015-12-27 DIAGNOSIS — I10 Essential (primary) hypertension: Secondary | ICD-10-CM | POA: Diagnosis not present

## 2015-12-27 DIAGNOSIS — K210 Gastro-esophageal reflux disease with esophagitis: Secondary | ICD-10-CM | POA: Diagnosis not present

## 2015-12-27 DIAGNOSIS — F330 Major depressive disorder, recurrent, mild: Secondary | ICD-10-CM | POA: Diagnosis not present

## 2016-01-01 DIAGNOSIS — M5441 Lumbago with sciatica, right side: Secondary | ICD-10-CM | POA: Diagnosis not present

## 2016-01-01 DIAGNOSIS — M47816 Spondylosis without myelopathy or radiculopathy, lumbar region: Secondary | ICD-10-CM | POA: Diagnosis not present

## 2016-01-01 DIAGNOSIS — M531 Cervicobrachial syndrome: Secondary | ICD-10-CM | POA: Diagnosis not present

## 2016-01-01 DIAGNOSIS — M47812 Spondylosis without myelopathy or radiculopathy, cervical region: Secondary | ICD-10-CM | POA: Diagnosis not present

## 2016-01-01 DIAGNOSIS — S134XXA Sprain of ligaments of cervical spine, initial encounter: Secondary | ICD-10-CM | POA: Diagnosis not present

## 2016-01-01 DIAGNOSIS — M9903 Segmental and somatic dysfunction of lumbar region: Secondary | ICD-10-CM | POA: Diagnosis not present

## 2016-01-01 DIAGNOSIS — M546 Pain in thoracic spine: Secondary | ICD-10-CM | POA: Diagnosis not present

## 2016-01-01 DIAGNOSIS — M9901 Segmental and somatic dysfunction of cervical region: Secondary | ICD-10-CM | POA: Diagnosis not present

## 2016-01-01 DIAGNOSIS — M9902 Segmental and somatic dysfunction of thoracic region: Secondary | ICD-10-CM | POA: Diagnosis not present

## 2016-01-06 ENCOUNTER — Encounter (HOSPITAL_COMMUNITY): Payer: Self-pay | Admitting: Psychiatry

## 2016-01-06 ENCOUNTER — Ambulatory Visit (INDEPENDENT_AMBULATORY_CARE_PROVIDER_SITE_OTHER): Payer: Medicare Other | Admitting: Psychiatry

## 2016-01-06 VITALS — BP 114/63 | HR 70 | Ht 66.0 in | Wt 180.0 lb

## 2016-01-06 DIAGNOSIS — Z79899 Other long term (current) drug therapy: Secondary | ICD-10-CM

## 2016-01-06 DIAGNOSIS — Z818 Family history of other mental and behavioral disorders: Secondary | ICD-10-CM

## 2016-01-06 DIAGNOSIS — Z888 Allergy status to other drugs, medicaments and biological substances status: Secondary | ICD-10-CM

## 2016-01-06 DIAGNOSIS — Z811 Family history of alcohol abuse and dependence: Secondary | ICD-10-CM

## 2016-01-06 DIAGNOSIS — F320 Major depressive disorder, single episode, mild: Secondary | ICD-10-CM | POA: Diagnosis not present

## 2016-01-06 DIAGNOSIS — Z882 Allergy status to sulfonamides status: Secondary | ICD-10-CM | POA: Diagnosis not present

## 2016-01-06 MED ORDER — ALPRAZOLAM 0.5 MG PO TABS: 0.5000 mg | ORAL_TABLET | Freq: Every day | ORAL | 3 refills | Status: DC
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
## 2016-01-22 DIAGNOSIS — S134XXA Sprain of ligaments of cervical spine, initial encounter: Secondary | ICD-10-CM | POA: Diagnosis not present

## 2016-01-22 DIAGNOSIS — M9902 Segmental and somatic dysfunction of thoracic region: Secondary | ICD-10-CM | POA: Diagnosis not present

## 2016-01-22 DIAGNOSIS — M9901 Segmental and somatic dysfunction of cervical region: Secondary | ICD-10-CM | POA: Diagnosis not present

## 2016-01-22 DIAGNOSIS — M546 Pain in thoracic spine: Secondary | ICD-10-CM | POA: Diagnosis not present

## 2016-01-22 DIAGNOSIS — M9903 Segmental and somatic dysfunction of lumbar region: Secondary | ICD-10-CM | POA: Diagnosis not present

## 2016-01-22 DIAGNOSIS — M5441 Lumbago with sciatica, right side: Secondary | ICD-10-CM | POA: Diagnosis not present

## 2016-01-22 DIAGNOSIS — M47816 Spondylosis without myelopathy or radiculopathy, lumbar region: Secondary | ICD-10-CM | POA: Diagnosis not present

## 2016-01-22 DIAGNOSIS — M531 Cervicobrachial syndrome: Secondary | ICD-10-CM | POA: Diagnosis not present

## 2016-01-22 DIAGNOSIS — M47812 Spondylosis without myelopathy or radiculopathy, cervical region: Secondary | ICD-10-CM | POA: Diagnosis not present

## 2016-01-24 DIAGNOSIS — I10 Essential (primary) hypertension: Secondary | ICD-10-CM | POA: Diagnosis not present

## 2016-01-24 DIAGNOSIS — M545 Low back pain: Secondary | ICD-10-CM | POA: Diagnosis not present

## 2016-01-24 DIAGNOSIS — K210 Gastro-esophageal reflux disease with esophagitis: Secondary | ICD-10-CM | POA: Diagnosis not present

## 2016-01-24 DIAGNOSIS — Z0000 Encounter for general adult medical examination without abnormal findings: Secondary | ICD-10-CM | POA: Diagnosis not present

## 2016-01-24 DIAGNOSIS — Z1389 Encounter for screening for other disorder: Secondary | ICD-10-CM | POA: Diagnosis not present

## 2016-01-24 DIAGNOSIS — F330 Major depressive disorder, recurrent, mild: Secondary | ICD-10-CM | POA: Diagnosis not present

## 2016-01-28 DIAGNOSIS — I10 Essential (primary) hypertension: Secondary | ICD-10-CM | POA: Diagnosis not present

## 2016-01-28 DIAGNOSIS — F320 Major depressive disorder, single episode, mild: Secondary | ICD-10-CM | POA: Diagnosis not present

## 2016-01-28 DIAGNOSIS — K210 Gastro-esophageal reflux disease with esophagitis: Secondary | ICD-10-CM | POA: Diagnosis not present

## 2016-02-01 ENCOUNTER — Other Ambulatory Visit (HOSPITAL_COMMUNITY): Payer: Self-pay | Admitting: Psychiatry

## 2016-02-18 DIAGNOSIS — Z9001 Acquired absence of eye: Secondary | ICD-10-CM | POA: Diagnosis not present

## 2016-02-20 DIAGNOSIS — H02135 Senile ectropion of left lower eyelid: Secondary | ICD-10-CM | POA: Diagnosis not present

## 2016-02-20 DIAGNOSIS — Z9001 Acquired absence of eye: Secondary | ICD-10-CM | POA: Diagnosis not present

## 2016-02-20 DIAGNOSIS — H02132 Senile ectropion of right lower eyelid: Secondary | ICD-10-CM | POA: Diagnosis not present

## 2016-03-03 DIAGNOSIS — H02135 Senile ectropion of left lower eyelid: Secondary | ICD-10-CM | POA: Diagnosis not present

## 2016-03-03 DIAGNOSIS — H02132 Senile ectropion of right lower eyelid: Secondary | ICD-10-CM | POA: Diagnosis not present

## 2016-03-24 DIAGNOSIS — K210 Gastro-esophageal reflux disease with esophagitis: Secondary | ICD-10-CM | POA: Diagnosis not present

## 2016-03-24 DIAGNOSIS — I10 Essential (primary) hypertension: Secondary | ICD-10-CM | POA: Diagnosis not present

## 2016-03-24 DIAGNOSIS — F320 Major depressive disorder, single episode, mild: Secondary | ICD-10-CM | POA: Diagnosis not present

## 2016-04-22 DIAGNOSIS — K210 Gastro-esophageal reflux disease with esophagitis: Secondary | ICD-10-CM | POA: Diagnosis not present

## 2016-04-22 DIAGNOSIS — I10 Essential (primary) hypertension: Secondary | ICD-10-CM | POA: Diagnosis not present

## 2016-04-22 DIAGNOSIS — F320 Major depressive disorder, single episode, mild: Secondary | ICD-10-CM | POA: Diagnosis not present

## 2016-04-23 DIAGNOSIS — M545 Low back pain: Secondary | ICD-10-CM | POA: Diagnosis not present

## 2016-04-23 DIAGNOSIS — K210 Gastro-esophageal reflux disease with esophagitis: Secondary | ICD-10-CM | POA: Diagnosis not present

## 2016-04-23 DIAGNOSIS — Z0000 Encounter for general adult medical examination without abnormal findings: Secondary | ICD-10-CM | POA: Diagnosis not present

## 2016-04-23 DIAGNOSIS — F330 Major depressive disorder, recurrent, mild: Secondary | ICD-10-CM | POA: Diagnosis not present

## 2016-04-23 DIAGNOSIS — I10 Essential (primary) hypertension: Secondary | ICD-10-CM | POA: Diagnosis not present

## 2016-04-27 ENCOUNTER — Ambulatory Visit (HOSPITAL_COMMUNITY): Payer: Self-pay | Admitting: Psychiatry

## 2016-05-04 ENCOUNTER — Ambulatory Visit (HOSPITAL_COMMUNITY): Payer: Self-pay | Admitting: Psychiatry

## 2016-05-05 ENCOUNTER — Encounter (HOSPITAL_COMMUNITY): Payer: Self-pay | Admitting: Psychiatry

## 2016-05-05 ENCOUNTER — Ambulatory Visit (INDEPENDENT_AMBULATORY_CARE_PROVIDER_SITE_OTHER): Payer: Medicare Other | Admitting: Psychiatry

## 2016-05-05 VITALS — BP 139/76 | HR 69 | Ht 66.0 in | Wt 182.0 lb

## 2016-05-05 DIAGNOSIS — Z811 Family history of alcohol abuse and dependence: Secondary | ICD-10-CM

## 2016-05-05 DIAGNOSIS — Z818 Family history of other mental and behavioral disorders: Secondary | ICD-10-CM | POA: Diagnosis not present

## 2016-05-05 DIAGNOSIS — F320 Major depressive disorder, single episode, mild: Secondary | ICD-10-CM | POA: Diagnosis not present

## 2016-05-05 DIAGNOSIS — Z813 Family history of other psychoactive substance abuse and dependence: Secondary | ICD-10-CM

## 2016-05-05 DIAGNOSIS — Z79899 Other long term (current) drug therapy: Secondary | ICD-10-CM

## 2016-05-05 DIAGNOSIS — F321 Major depressive disorder, single episode, moderate: Secondary | ICD-10-CM

## 2016-05-05 MED ORDER — ALPRAZOLAM 0.5 MG PO TABS: 0.5000 mg | ORAL_TABLET | Freq: Every day | ORAL | 3 refills | Status: DC
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
## 2016-05-12 DIAGNOSIS — H02413 Mechanical ptosis of bilateral eyelids: Secondary | ICD-10-CM | POA: Diagnosis not present

## 2016-05-20 DIAGNOSIS — K210 Gastro-esophageal reflux disease with esophagitis: Secondary | ICD-10-CM | POA: Diagnosis not present

## 2016-05-20 DIAGNOSIS — F320 Major depressive disorder, single episode, mild: Secondary | ICD-10-CM | POA: Diagnosis not present

## 2016-05-20 DIAGNOSIS — I10 Essential (primary) hypertension: Secondary | ICD-10-CM | POA: Diagnosis not present

## 2016-05-21 ENCOUNTER — Ambulatory Visit (INDEPENDENT_AMBULATORY_CARE_PROVIDER_SITE_OTHER): Payer: Medicare Other | Admitting: Otolaryngology

## 2016-05-21 DIAGNOSIS — J382 Nodules of vocal cords: Secondary | ICD-10-CM

## 2016-05-21 DIAGNOSIS — R490 Dysphonia: Secondary | ICD-10-CM | POA: Diagnosis not present

## 2016-06-12 DIAGNOSIS — I10 Essential (primary) hypertension: Secondary | ICD-10-CM | POA: Diagnosis not present

## 2016-06-12 DIAGNOSIS — F320 Major depressive disorder, single episode, mild: Secondary | ICD-10-CM | POA: Diagnosis not present

## 2016-06-12 DIAGNOSIS — K210 Gastro-esophageal reflux disease with esophagitis: Secondary | ICD-10-CM | POA: Diagnosis not present

## 2016-06-18 DIAGNOSIS — Z79899 Other long term (current) drug therapy: Secondary | ICD-10-CM | POA: Diagnosis not present

## 2016-06-18 DIAGNOSIS — R490 Dysphonia: Secondary | ICD-10-CM | POA: Insufficient documentation

## 2016-06-18 DIAGNOSIS — J385 Laryngeal spasm: Secondary | ICD-10-CM | POA: Diagnosis not present

## 2016-06-18 DIAGNOSIS — Z87891 Personal history of nicotine dependence: Secondary | ICD-10-CM | POA: Diagnosis not present

## 2016-06-18 DIAGNOSIS — I10 Essential (primary) hypertension: Secondary | ICD-10-CM | POA: Diagnosis not present

## 2016-06-18 DIAGNOSIS — K2270 Barrett's esophagus without dysplasia: Secondary | ICD-10-CM | POA: Diagnosis not present

## 2016-06-18 DIAGNOSIS — K219 Gastro-esophageal reflux disease without esophagitis: Secondary | ICD-10-CM | POA: Diagnosis not present

## 2016-06-18 DIAGNOSIS — Z9889 Other specified postprocedural states: Secondary | ICD-10-CM | POA: Diagnosis not present

## 2016-06-18 DIAGNOSIS — Z882 Allergy status to sulfonamides status: Secondary | ICD-10-CM | POA: Diagnosis not present

## 2016-06-25 DIAGNOSIS — R490 Dysphonia: Secondary | ICD-10-CM | POA: Diagnosis not present

## 2016-07-01 DIAGNOSIS — F320 Major depressive disorder, single episode, mild: Secondary | ICD-10-CM | POA: Diagnosis not present

## 2016-07-01 DIAGNOSIS — I10 Essential (primary) hypertension: Secondary | ICD-10-CM | POA: Diagnosis not present

## 2016-07-01 DIAGNOSIS — K210 Gastro-esophageal reflux disease with esophagitis: Secondary | ICD-10-CM | POA: Diagnosis not present

## 2016-07-01 DIAGNOSIS — R490 Dysphonia: Secondary | ICD-10-CM | POA: Diagnosis not present

## 2016-07-08 ENCOUNTER — Ambulatory Visit (INDEPENDENT_AMBULATORY_CARE_PROVIDER_SITE_OTHER): Payer: Medicare Other | Admitting: Psychiatry

## 2016-07-08 ENCOUNTER — Encounter (HOSPITAL_COMMUNITY): Payer: Self-pay | Admitting: Psychiatry

## 2016-07-08 VITALS — BP 112/78 | HR 77 | Wt 178.0 lb

## 2016-07-08 DIAGNOSIS — F419 Anxiety disorder, unspecified: Secondary | ICD-10-CM | POA: Diagnosis not present

## 2016-07-08 DIAGNOSIS — Z814 Family history of other substance abuse and dependence: Secondary | ICD-10-CM | POA: Diagnosis not present

## 2016-07-08 DIAGNOSIS — Z888 Allergy status to other drugs, medicaments and biological substances status: Secondary | ICD-10-CM | POA: Diagnosis not present

## 2016-07-08 DIAGNOSIS — Z79899 Other long term (current) drug therapy: Secondary | ICD-10-CM | POA: Diagnosis not present

## 2016-07-08 DIAGNOSIS — Z79891 Long term (current) use of opiate analgesic: Secondary | ICD-10-CM

## 2016-07-08 DIAGNOSIS — H547 Unspecified visual loss: Secondary | ICD-10-CM

## 2016-07-08 DIAGNOSIS — F320 Major depressive disorder, single episode, mild: Secondary | ICD-10-CM

## 2016-07-08 DIAGNOSIS — Z811 Family history of alcohol abuse and dependence: Secondary | ICD-10-CM | POA: Diagnosis not present

## 2016-07-08 DIAGNOSIS — Z818 Family history of other mental and behavioral disorders: Secondary | ICD-10-CM | POA: Diagnosis not present

## 2016-07-08 DIAGNOSIS — Z882 Allergy status to sulfonamides status: Secondary | ICD-10-CM | POA: Diagnosis not present
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
## 2016-07-21 ENCOUNTER — Ambulatory Visit (HOSPITAL_COMMUNITY): Payer: Self-pay | Admitting: Psychiatry

## 2016-07-24 DIAGNOSIS — F330 Major depressive disorder, recurrent, mild: Secondary | ICD-10-CM | POA: Diagnosis not present

## 2016-07-24 DIAGNOSIS — M545 Low back pain: Secondary | ICD-10-CM | POA: Diagnosis not present

## 2016-07-24 DIAGNOSIS — I10 Essential (primary) hypertension: Secondary | ICD-10-CM | POA: Diagnosis not present

## 2016-07-24 DIAGNOSIS — K210 Gastro-esophageal reflux disease with esophagitis: Secondary | ICD-10-CM | POA: Diagnosis not present

## 2016-08-03 DIAGNOSIS — M810 Age-related osteoporosis without current pathological fracture: Secondary | ICD-10-CM | POA: Diagnosis not present

## 2016-08-25 DIAGNOSIS — H05229 Edema of unspecified orbit: Secondary | ICD-10-CM | POA: Diagnosis not present

## 2016-09-08 DIAGNOSIS — I10 Essential (primary) hypertension: Secondary | ICD-10-CM | POA: Diagnosis not present

## 2016-09-08 DIAGNOSIS — F320 Major depressive disorder, single episode, mild: Secondary | ICD-10-CM | POA: Diagnosis not present

## 2016-09-08 DIAGNOSIS — K219 Gastro-esophageal reflux disease without esophagitis: Secondary | ICD-10-CM | POA: Diagnosis not present

## 2016-10-01 DIAGNOSIS — R220 Localized swelling, mass and lump, head: Secondary | ICD-10-CM | POA: Diagnosis not present

## 2016-10-01 DIAGNOSIS — R600 Localized edema: Secondary | ICD-10-CM | POA: Diagnosis not present

## 2016-10-02 DIAGNOSIS — K219 Gastro-esophageal reflux disease without esophagitis: Secondary | ICD-10-CM | POA: Diagnosis not present

## 2016-10-02 DIAGNOSIS — I10 Essential (primary) hypertension: Secondary | ICD-10-CM | POA: Diagnosis not present

## 2016-10-02 DIAGNOSIS — F320 Major depressive disorder, single episode, mild: Secondary | ICD-10-CM | POA: Diagnosis not present

## 2016-10-05 DIAGNOSIS — Z1231 Encounter for screening mammogram for malignant neoplasm of breast: Secondary | ICD-10-CM | POA: Diagnosis not present

## 2016-10-08 ENCOUNTER — Encounter (HOSPITAL_COMMUNITY): Payer: Self-pay | Admitting: Psychiatry

## 2016-10-08 ENCOUNTER — Ambulatory Visit (INDEPENDENT_AMBULATORY_CARE_PROVIDER_SITE_OTHER): Payer: Medicare Other | Admitting: Otolaryngology

## 2016-10-08 ENCOUNTER — Ambulatory Visit (INDEPENDENT_AMBULATORY_CARE_PROVIDER_SITE_OTHER): Payer: Medicare Other | Admitting: Psychiatry

## 2016-10-08 VITALS — BP 125/76 | HR 78 | Ht 66.0 in | Wt 178.0 lb

## 2016-10-08 DIAGNOSIS — Z736 Limitation of activities due to disability: Secondary | ICD-10-CM

## 2016-10-08 DIAGNOSIS — R450 Nervousness: Secondary | ICD-10-CM

## 2016-10-08 DIAGNOSIS — F419 Anxiety disorder, unspecified: Secondary | ICD-10-CM

## 2016-10-08 DIAGNOSIS — H543 Unqualified visual loss, both eyes: Secondary | ICD-10-CM | POA: Diagnosis not present

## 2016-10-08 DIAGNOSIS — R197 Diarrhea, unspecified: Secondary | ICD-10-CM

## 2016-10-08 DIAGNOSIS — J343 Hypertrophy of nasal turbinates: Secondary | ICD-10-CM

## 2016-10-08 DIAGNOSIS — M549 Dorsalgia, unspecified: Secondary | ICD-10-CM | POA: Diagnosis not present

## 2016-10-08 DIAGNOSIS — J310 Chronic rhinitis: Secondary | ICD-10-CM | POA: Diagnosis not present

## 2016-10-08 DIAGNOSIS — Z811 Family history of alcohol abuse and dependence: Secondary | ICD-10-CM

## 2016-10-08 DIAGNOSIS — Z813 Family history of other psychoactive substance abuse and dependence: Secondary | ICD-10-CM

## 2016-10-08 DIAGNOSIS — Z818 Family history of other mental and behavioral disorders: Secondary | ICD-10-CM

## 2016-10-08 DIAGNOSIS — D440 Neoplasm of uncertain behavior of thyroid gland: Secondary | ICD-10-CM | POA: Diagnosis not present

## 2016-10-08 DIAGNOSIS — J338 Other polyp of sinus: Secondary | ICD-10-CM

## 2016-10-08 DIAGNOSIS — F320 Major depressive disorder, single episode, mild: Secondary | ICD-10-CM

## 2016-10-08 MED ORDER — ALPRAZOLAM 1 MG PO TABS: 1.0000 mg | ORAL_TABLET | Freq: Every evening | ORAL | 2 refills | Status: DC | PRN
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
## 2016-10-20 DIAGNOSIS — I10 Essential (primary) hypertension: Secondary | ICD-10-CM | POA: Diagnosis not present

## 2016-10-20 DIAGNOSIS — F330 Major depressive disorder, recurrent, mild: Secondary | ICD-10-CM | POA: Diagnosis not present

## 2016-10-20 DIAGNOSIS — K210 Gastro-esophageal reflux disease with esophagitis: Secondary | ICD-10-CM | POA: Diagnosis not present

## 2016-10-20 DIAGNOSIS — M545 Low back pain: Secondary | ICD-10-CM | POA: Diagnosis not present

## 2016-10-21 DIAGNOSIS — I10 Essential (primary) hypertension: Secondary | ICD-10-CM | POA: Diagnosis not present

## 2016-10-21 DIAGNOSIS — Z1389 Encounter for screening for other disorder: Secondary | ICD-10-CM | POA: Diagnosis not present

## 2016-11-10 DIAGNOSIS — R220 Localized swelling, mass and lump, head: Secondary | ICD-10-CM | POA: Diagnosis not present

## 2016-11-24 DIAGNOSIS — I10 Essential (primary) hypertension: Secondary | ICD-10-CM | POA: Diagnosis not present

## 2016-11-24 DIAGNOSIS — M545 Low back pain: Secondary | ICD-10-CM | POA: Diagnosis not present

## 2016-11-24 DIAGNOSIS — K210 Gastro-esophageal reflux disease with esophagitis: Secondary | ICD-10-CM | POA: Diagnosis not present

## 2016-11-24 DIAGNOSIS — F330 Major depressive disorder, recurrent, mild: Secondary | ICD-10-CM | POA: Diagnosis not present

## 2016-12-03 DIAGNOSIS — L820 Inflamed seborrheic keratosis: Secondary | ICD-10-CM | POA: Diagnosis not present

## 2016-12-03 DIAGNOSIS — L298 Other pruritus: Secondary | ICD-10-CM | POA: Diagnosis not present

## 2016-12-25 DIAGNOSIS — R768 Other specified abnormal immunological findings in serum: Secondary | ICD-10-CM | POA: Diagnosis not present

## 2016-12-25 DIAGNOSIS — R21 Rash and other nonspecific skin eruption: Secondary | ICD-10-CM | POA: Diagnosis not present

## 2016-12-25 DIAGNOSIS — I10 Essential (primary) hypertension: Secondary | ICD-10-CM | POA: Diagnosis not present

## 2016-12-25 DIAGNOSIS — Z6828 Body mass index (BMI) 28.0-28.9, adult: Secondary | ICD-10-CM | POA: Diagnosis not present

## 2016-12-25 DIAGNOSIS — E663 Overweight: Secondary | ICD-10-CM | POA: Diagnosis not present

## 2016-12-25 DIAGNOSIS — M545 Low back pain: Secondary | ICD-10-CM | POA: Diagnosis not present

## 2016-12-25 DIAGNOSIS — E041 Nontoxic single thyroid nodule: Secondary | ICD-10-CM | POA: Diagnosis not present

## 2016-12-25 DIAGNOSIS — M2550 Pain in unspecified joint: Secondary | ICD-10-CM | POA: Diagnosis not present

## 2016-12-25 DIAGNOSIS — L509 Urticaria, unspecified: Secondary | ICD-10-CM | POA: Diagnosis not present

## 2016-12-25 DIAGNOSIS — F330 Major depressive disorder, recurrent, mild: Secondary | ICD-10-CM | POA: Diagnosis not present

## 2016-12-25 DIAGNOSIS — K210 Gastro-esophageal reflux disease with esophagitis: Secondary | ICD-10-CM | POA: Diagnosis not present

## 2017-01-07 ENCOUNTER — Ambulatory Visit (INDEPENDENT_AMBULATORY_CARE_PROVIDER_SITE_OTHER): Payer: Medicare Other | Admitting: Psychiatry

## 2017-01-07 ENCOUNTER — Encounter (HOSPITAL_COMMUNITY): Payer: Self-pay | Admitting: Psychiatry

## 2017-01-07 VITALS — BP 116/72 | HR 74 | Ht 66.0 in | Wt 177.0 lb

## 2017-01-07 DIAGNOSIS — Z736 Limitation of activities due to disability: Secondary | ICD-10-CM | POA: Diagnosis not present

## 2017-01-07 DIAGNOSIS — H543 Unqualified visual loss, both eyes: Secondary | ICD-10-CM | POA: Diagnosis not present

## 2017-01-07 DIAGNOSIS — Z818 Family history of other mental and behavioral disorders: Secondary | ICD-10-CM

## 2017-01-07 DIAGNOSIS — Z87891 Personal history of nicotine dependence: Secondary | ICD-10-CM

## 2017-01-07 DIAGNOSIS — Z811 Family history of alcohol abuse and dependence: Secondary | ICD-10-CM | POA: Diagnosis not present

## 2017-01-07 DIAGNOSIS — F320 Major depressive disorder, single episode, mild: Secondary | ICD-10-CM | POA: Diagnosis not present

## 2017-01-07 DIAGNOSIS — M2550 Pain in unspecified joint: Secondary | ICD-10-CM | POA: Diagnosis not present

## 2017-01-07 DIAGNOSIS — Z813 Family history of other psychoactive substance abuse and dependence: Secondary | ICD-10-CM

## 2017-01-07 MED ORDER — ALPRAZOLAM 1 MG PO TABS: 1.0000 mg | ORAL_TABLET | Freq: Every evening | ORAL | 3 refills | Status: DC | PRN

## 2017-01-07 NOTE — Progress Notes (Signed)
[facility] MD/PA/NP OP Progress Note  01/07/2017 2:56 PM Erica Bennett  MRN:  [id number]  Chief Complaint:  Chief Complaint    Anxiety; Follow-up     HPI: this patient is a 71 year old married white female who lives with her husband in [place]. They've been married for 14 years. She has no children. She is on disability for congenital blindness.  The patient was referred by Dr.Hosanji, her primary physician, for further assessment of depression anxiety.  The patient states that her mother contracted rubella when she was pregnant with her. She developed congenital blindness that worsened in her 17s with severe macular degeneration. By her 34s she was totally blind. 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  She has learned Braille and gets help through the [employer]. She was very comfortable doing things for herself. Both her parents are deceased and she doesn't have any other family. 11 years ago she reconnected with a man that she grown up with all her life. He had a history of alcoholism but claimed he had been sober for 5 years. They got married initially it went okay but then he began drinking. He goes through alcoholic bouts that can last months to years and he usually drinks about a half gallon of vodka a day. He last went through treatment last June but around March of this year he started drinking again. She felt very taken advantage of that she can't see what he is doing but she has been finding the bottles in confronting him and he is been lying about it. This is made her very uncomfortable. He is her power of attorney and they share a bank account. They've been arguing a lot more and she is become increasingly anxious and somewhat depressed.  The patient did have some prior treatment in her 39s when she  went blind. She saw psychiatrist to help deal with the change and also took Paxil for a while. She's not had any treatment since. She's very active in Al-Anon and has several good friends from the program. She knows that she should detach from her alcoholic husband but it's difficult to do so because of her dependency on him.  The patient has always had difficulty sleeping because of the circadian problem with being blind. She has tried [place] and Ambien but they didn't help and the newer drugs for this are extremely expensive. She is on a low dose of Xanax which is helped a little bit but she only gets about 3-4 hours of sleep a night. She is tired through the day, her energy is low. She denies suicidal ideation. She enjoys doing things with friends but will often walk him around because of her husbands behavior. She herself does not drink or use drugs and she's never had psychotic symptoms  Patient returns today after 3 months.  Last time she had facial swelling and she is undergoing a workup for possible lupus.  She also has joint pain.  She will get the results on December 20.  In the meantime her mood has been good her spirits seem to be fairly high.  She is not on any antidepressant medication.  She only takes Xanax 1 mg to help her sleep and to keep on the right current circadian rhythm.  Since she is blind it  is hard for her to know day from night but uses a clock.  Her husband is no longer drinking and this has made her life a lot easier   Visit Diagnosis:    ICD-10-CM   1. 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 Blindness of both eyes   . Elevated cholesterol   . GERD (gastroesophageal reflux disease)   . Headache   . Hypertension   . Seizures ([place])   . Skin cancer     Past Surgical History:  Procedure Laterality Date  . ABDOMINAL  HYSTERECTOMY    . Bone spur removed from shoulder    . CHOLECYSTECTOMY    . ENUCLEATION Bilateral   . TONSILLECTOMY      Family Psychiatric History: See below  Family History:  Family History  Problem Relation Age of Onset  . Depression Paternal Aunt   . Alcohol abuse Paternal Uncle   . Drug abuse Paternal Uncle   . Depression Cousin   . Depression Maternal Aunt     Social History:  Social History   Socioeconomic History  . Marital status: Married    Spouse name: None  . Number of children: None  . Years of education: None  . Highest education level: None  Social Needs  . Financial resource strain: None  . Food insecurity - worry: None  . Food insecurity - inability: None  . Transportation needs - medical: None  . Transportation needs - non-medical: None  Occupational History  . None  Tobacco Use  . Smoking status: Former Research scientist (life sciences)  . Smokeless tobacco: Never Used  . Tobacco comment: Quit 23 years ago  Substance and Sexual Activity  . Alcohol use: No    Alcohol/week: 0.0 oz  . Drug use: No  . Sexual activity: Yes    Birth control/protection: Surgical  Other Topics Concern  . None  Social History Narrative  . None    Allergies:  Allergies  Allergen Reactions  . Bacitracin Rash  . Neomycin Rash  . Sulfa Antibiotics Swelling and Rash  . Sulfamethoxazole Other (See Comments) and Swelling  . Prozac [Fluoxetine Hcl]     Severe headache    Metabolic Disorder Labs: No results found for: HGBA1C, MPG No results found for: PROLACTIN No results found for: CHOL, TRIG, HDL, CHOLHDL, VLDL, LDLCALC No results found for: TSH  Therapeutic Level Labs: No results found for: LITHIUM No results found for: VALPROATE No components found for:  CBMZ  Current Medications: Current Outpatient Medications  Medication Sig Dispense Refill  . ALPRAZolam (XANAX) 1 MG tablet Take 1 tablet (1 mg total) by mouth at bedtime as needed for anxiety. 30 tablet 3  . CARBATROL 200 MG  12 hr capsule Take 200 mg by mouth 2 (two) times daily.   3  . clindamycin (CLEOCIN) 300 MG capsule clindamycin HCl 300 mg capsule    . DEXILANT 30 MG capsule Take 1 capsule by mouth daily.  1  . diphenhydrAMINE (BENADRYL) 25 MG tablet Take 25 mg by mouth every 6 (six) hours as needed.    . hydrochlorothiazide (HYDRODIURIL) 25 MG tablet Take 25 mg by mouth daily.  0  . HYDROcodone-acetaminophen (NORCO/VICODIN) 5-325 MG tablet Take 1 tablet by mouth every 6 (six) hours as needed for moderate pain (back pain).    Marland Kitchen MAXALT 10 MG tablet Take 10  mg by mouth as needed.   5  . metoprolol succinate (TOPROL-XL) 100 MG 24 hr tablet Take 100 mg by mouth daily.  1  . prednisoLONE acetate (PRED FORTE) 1 % ophthalmic suspension INSTILL ONE DROP IN [facility] EYE TWICE DAILY.    . ranitidine (ZANTAC) 150 MG tablet Take 300 mg by mouth at bedtime.     No current facility-administered medications for this visit.      Musculoskeletal: Strength & Muscle Tone: within normal limits Gait & Station: normal Patient leans: N/A  Psychiatric Specialty Exam: Review of Systems  Eyes:       Is blind  Musculoskeletal: Positive for joint pain.    Blood pressure 116/72, pulse 74, height 5\' 6"  (1.676 m), weight 177 lb (80.3 kg), SpO2 95 %.Body mass index is 28.57 kg/m.  General Appearance: Casual, Neat and Well Groomed  Eye Contact:  She is blind  Speech:  Clear and Coherent  Volume:  Normal  Mood:  Euthymic  Affect:  Congruent  Thought Process:  Goal Directed  Orientation:  Full (Time, Place, and Person)  Thought Content: WDL   Suicidal Thoughts:  No  Homicidal Thoughts:  No  Memory:  Immediate;   Good Recent;   Good Remote;   Good  Judgement:  Good  Insight:  Good  Psychomotor Activity:  Decreased  Concentration:  Concentration: Good and Attention Span: Good  Recall:  Good  Fund of Knowledge: Good  Language: Good  Akathisia:  No  Handed:  Right  AIMS (if indicated): not done  Assets:  Communication  Skills Desire for Improvement Resilience Social Support Talents/Skills  ADL's:  Intact  Cognition: WNL  Sleep:  Good   Screenings:   Assessment and Plan: This patient is a 71 year old female with a history of acquired blindness depression and anxiety.  She is currently on no antidepressant and feels like her mood is good.  She does take Xanax 1 mg at night to help with sleep and anxiety and this is working well for her.  She will continue with this medication and return to see me in 4 months   [name], MD 01/07/2017, 2:56 PM

## 2017-01-12 DIAGNOSIS — K210 Gastro-esophageal reflux disease with esophagitis: Secondary | ICD-10-CM | POA: Diagnosis not present

## 2017-01-12 DIAGNOSIS — M545 Low back pain: Secondary | ICD-10-CM | POA: Diagnosis not present

## 2017-01-12 DIAGNOSIS — F330 Major depressive disorder, recurrent, mild: Secondary | ICD-10-CM | POA: Diagnosis not present

## 2017-01-12 DIAGNOSIS — I10 Essential (primary) hypertension: Secondary | ICD-10-CM | POA: Diagnosis not present

## 2017-01-14 DIAGNOSIS — Z6828 Body mass index (BMI) 28.0-28.9, adult: Secondary | ICD-10-CM | POA: Diagnosis not present

## 2017-01-14 DIAGNOSIS — M359 Systemic involvement of connective tissue, unspecified: Secondary | ICD-10-CM | POA: Diagnosis not present

## 2017-01-14 DIAGNOSIS — E041 Nontoxic single thyroid nodule: Secondary | ICD-10-CM | POA: Diagnosis not present

## 2017-01-14 DIAGNOSIS — R768 Other specified abnormal immunological findings in serum: Secondary | ICD-10-CM | POA: Diagnosis not present

## 2017-01-14 DIAGNOSIS — R21 Rash and other nonspecific skin eruption: Secondary | ICD-10-CM | POA: Diagnosis not present

## 2017-01-14 DIAGNOSIS — E663 Overweight: Secondary | ICD-10-CM | POA: Diagnosis not present

## 2017-01-14 DIAGNOSIS — Z6829 Body mass index (BMI) 29.0-29.9, adult: Secondary | ICD-10-CM | POA: Diagnosis not present

## 2017-01-14 DIAGNOSIS — M2550 Pain in unspecified joint: Secondary | ICD-10-CM | POA: Diagnosis not present

## 2017-01-14 DIAGNOSIS — L509 Urticaria, unspecified: Secondary | ICD-10-CM | POA: Diagnosis not present

## 2017-01-20 DIAGNOSIS — F330 Major depressive disorder, recurrent, mild: Secondary | ICD-10-CM | POA: Diagnosis not present

## 2017-01-20 DIAGNOSIS — M545 Low back pain: Secondary | ICD-10-CM | POA: Diagnosis not present

## 2017-01-20 DIAGNOSIS — I10 Essential (primary) hypertension: Secondary | ICD-10-CM | POA: Diagnosis not present

## 2017-01-20 DIAGNOSIS — K210 Gastro-esophageal reflux disease with esophagitis: Secondary | ICD-10-CM | POA: Diagnosis not present

## 2017-02-18 DIAGNOSIS — M368 Systemic disorders of connective tissue in other diseases classified elsewhere: Secondary | ICD-10-CM | POA: Diagnosis not present

## 2017-02-18 DIAGNOSIS — Z9001 Acquired absence of eye: Secondary | ICD-10-CM | POA: Diagnosis not present

## 2017-02-18 DIAGNOSIS — H05333 Deformity of bilateral orbits due to trauma or surgery: Secondary | ICD-10-CM | POA: Diagnosis not present

## 2017-04-02 DIAGNOSIS — M545 Low back pain: Secondary | ICD-10-CM | POA: Diagnosis not present

## 2017-04-02 DIAGNOSIS — I10 Essential (primary) hypertension: Secondary | ICD-10-CM | POA: Diagnosis not present

## 2017-04-02 DIAGNOSIS — F330 Major depressive disorder, recurrent, mild: Secondary | ICD-10-CM | POA: Diagnosis not present

## 2017-04-02 DIAGNOSIS — K210 Gastro-esophageal reflux disease with esophagitis: Secondary | ICD-10-CM | POA: Diagnosis not present

## 2017-04-08 DIAGNOSIS — M368 Systemic disorders of connective tissue in other diseases classified elsewhere: Secondary | ICD-10-CM | POA: Diagnosis not present

## 2017-04-08 DIAGNOSIS — Z9001 Acquired absence of eye: Secondary | ICD-10-CM | POA: Diagnosis not present

## 2017-04-08 DIAGNOSIS — H05333 Deformity of bilateral orbits due to trauma or surgery: Secondary | ICD-10-CM | POA: Diagnosis not present

## 2017-04-13 DIAGNOSIS — M545 Low back pain: Secondary | ICD-10-CM | POA: Diagnosis not present

## 2017-04-13 DIAGNOSIS — I10 Essential (primary) hypertension: Secondary | ICD-10-CM | POA: Diagnosis not present

## 2017-04-13 DIAGNOSIS — K210 Gastro-esophageal reflux disease with esophagitis: Secondary | ICD-10-CM | POA: Diagnosis not present

## 2017-04-13 DIAGNOSIS — F330 Major depressive disorder, recurrent, mild: Secondary | ICD-10-CM | POA: Diagnosis not present

## 2017-04-14 DIAGNOSIS — E041 Nontoxic single thyroid nodule: Secondary | ICD-10-CM | POA: Diagnosis not present

## 2017-04-14 DIAGNOSIS — E663 Overweight: Secondary | ICD-10-CM | POA: Diagnosis not present

## 2017-04-14 DIAGNOSIS — R768 Other specified abnormal immunological findings in serum: Secondary | ICD-10-CM | POA: Diagnosis not present

## 2017-04-14 DIAGNOSIS — Z6829 Body mass index (BMI) 29.0-29.9, adult: Secondary | ICD-10-CM | POA: Diagnosis not present

## 2017-04-14 DIAGNOSIS — M359 Systemic involvement of connective tissue, unspecified: Secondary | ICD-10-CM | POA: Diagnosis not present

## 2017-04-14 DIAGNOSIS — M2550 Pain in unspecified joint: Secondary | ICD-10-CM | POA: Diagnosis not present

## 2017-04-14 DIAGNOSIS — L509 Urticaria, unspecified: Secondary | ICD-10-CM | POA: Diagnosis not present

## 2017-04-14 DIAGNOSIS — R29818 Other symptoms and signs involving the nervous system: Secondary | ICD-10-CM | POA: Diagnosis not present

## 2017-04-14 DIAGNOSIS — R21 Rash and other nonspecific skin eruption: Secondary | ICD-10-CM | POA: Diagnosis not present

## 2017-04-21 ENCOUNTER — Other Ambulatory Visit: Payer: Self-pay

## 2017-04-21 ENCOUNTER — Encounter: Payer: Self-pay | Admitting: Neurology

## 2017-04-21 ENCOUNTER — Ambulatory Visit (INDEPENDENT_AMBULATORY_CARE_PROVIDER_SITE_OTHER): Payer: Medicare Other | Admitting: Neurology

## 2017-04-21 VITALS — BP 132/69 | HR 55 | Ht 66.0 in | Wt 180.0 lb

## 2017-04-21 DIAGNOSIS — R269 Unspecified abnormalities of gait and mobility: Secondary | ICD-10-CM | POA: Diagnosis not present

## 2017-04-21 DIAGNOSIS — Z5181 Encounter for therapeutic drug level monitoring: Secondary | ICD-10-CM | POA: Diagnosis not present

## 2017-04-21 NOTE — Progress Notes (Signed)
Reason for visit: Gait disturbance  Erica Bennett is an 72 y.o. female  History of present illness:  Erica Bennett is a 72 year old white female with a history of blindness and a history of seizures.  The patient has been on Carbatrol for many years.  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 Amil Amen.  The patient had noted some difficulty with balance that began shortly after starting Plaquenil.  The patient has had some problems with staggering, no falls.  She denies any significant problems with neck pain or low back pain or pain down the arms or legs.  She has not noted any difficulty with controlling the bowels or the bladder that is new or different, she does have some occasional urinary incontinence that is chronic in nature.  She reports no numbness or burning sensations in the feet.  She comes to this office for an evaluation.  The patient denies any dizziness or headache.  Past Medical History:  Diagnosis Date  . Blindness of both eyes   . Elevated cholesterol   . GERD (gastroesophageal reflux disease)   . Headache   . Hypertension   . Seizures ([place])   . Skin cancer     Past Surgical History:  Procedure Laterality Date  . ABDOMINAL HYSTERECTOMY    . Bone spur removed from shoulder    . CHOLECYSTECTOMY    . ENUCLEATION Bilateral   . TONSILLECTOMY      Family History  Problem Relation Age of Onset  . Depression Paternal Aunt   . Alcohol abuse Paternal Uncle   . Drug abuse Paternal Uncle   . Depression Cousin   . Depression Maternal Aunt     Social history:  reports that she has quit smoking. She has never used smokeless tobacco. She reports that she does not drink alcohol or use drugs.    Allergies  Allergen Reactions  . Bacitracin Rash  . Neomycin Rash  . Sulfa Antibiotics Swelling and Rash  . Sulfamethoxazole Other (See Comments) and Swelling  . Prozac [Fluoxetine Hcl]     Severe headache     Medications:  Prior to Admission medications   Medication Sig Start Date End Date Taking? Authorizing Provider  ALPRAZolam Duanne Moron) 1 MG tablet Take 1 tablet (1 mg total) by mouth at bedtime as needed for anxiety. 01/07/17 01/07/18 Yes Cloria Spring, MD  CARBATROL 200 MG 12 hr capsule Take 200 mg by mouth 2 (two) times daily.  05/22/14  Yes [provider]  DEXILANT 30 MG capsule Take 1 capsule by mouth daily. 04/12/14  Yes [provider]  diphenhydrAMINE (BENADRYL) 25 MG tablet Take 25 mg by mouth every 6 (six) hours as needed.   Yes [provider]  hydrochlorothiazide (HYDRODIURIL) 25 MG tablet Take 25 mg by mouth daily. 10/22/14  Yes [provider]  HYDROcodone-acetaminophen (NORCO/VICODIN) 5-325 MG tablet Take 1 tablet by mouth every 6 (six) hours as needed for moderate pain (back pain).   Yes [provider]  hydroxychloroquine (PLAQUENIL) 200 MG tablet Take 1 tablet by mouth 2 (two) times daily.   Yes [provider]  MAXALT 10 MG tablet Take 10 mg by mouth as needed.  05/08/14  Yes [provider]  metoprolol succinate (TOPROL-XL) 100 MG 24 hr tablet Take 100 mg by mouth daily. 05/08/14  Yes [provider]  prednisoLONE acetate (PRED FORTE) 1 % ophthalmic suspension INSTILL ONE DROP  IN [facility] EYE TWICE DAILY. 06/05/16  Yes [provider]  ranitidine (ZANTAC) 150 MG tablet Take 300 mg by mouth at bedtime.   Yes [provider]    ROS:  Out of a complete 14 system review of symptoms, the patient complains only of the following symptoms, and all other reviewed systems are negative.  Gait disturbance  Blood pressure 132/69, pulse (!) 55, height 5\' 6"  (1.676 m), weight 180 lb (81.6 kg).  Physical Exam  General: The patient is alert and cooperative at the time of the examination.  Skin: No significant peripheral edema is noted.   Neurologic Exam  Mental status: The patient is alert and  oriented x 3 at the time of the examination. The patient has apparent normal recent and remote memory, with an apparently normal attention span and concentration ability.   Cranial nerves: Facial symmetry is present. Speech is normal, no aphasia or dysarthria is noted. Extraocular movements are full. The patient is blind.  Motor: The patient has good strength in all 4 extremities.  Sensory examination: Soft touch sensation is symmetric on the face, arms, and legs.  Coordination: The patient has good finger-nose-finger and heel-to-shin bilaterally.  Gait and station: The patient has a minimally wide based gait. Tandem gait is unsteady. Romberg is negative. No drift is seen.  Reflexes: Deep tendon reflexes are symmetric.   Assessment/Plan:  1.  History of seizures  2.  Gait disturbance  The patient has noted a new problem with gait shortly after starting Plaquenil in December 2018.  The patient has developed some hyponatremia on the Carbatrol with a recent sodium level of 130.  There may be an interaction with the Plaquenil and the Carbatrol, we will check carbamazepine levels today.  If the patient is not toxic on carbamazepine, further workup will be undertaken.  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  The patient will follow-up in 4 months.  Jill Alexanders MD 04/21/2017 12:17 PM  [facility] [address] [place], [place] [address]  Phone [phone] Fax [phone]

## 2017-04-22 ENCOUNTER — Telehealth: Payer: Self-pay | Admitting: Neurology

## 2017-04-22 DIAGNOSIS — G3281 Cerebellar ataxia in diseases classified elsewhere: Secondary | ICD-10-CM

## 2017-04-22 DIAGNOSIS — R269 Unspecified abnormalities of gait and mobility: Secondary | ICD-10-CM

## 2017-04-22 LAB — COMPREHENSIVE METABOLIC PANEL
ALBUMIN: 4.8 g/dL (ref 3.6–4.8)
ALK PHOS: 88 IU/L (ref 39–117)
ALT: 14 IU/L (ref 0–32)
AST: 16 IU/L (ref 0–40)
Albumin/Globulin Ratio: 2.3 — ABNORMAL HIGH (ref 1.2–2.2)
BILIRUBIN TOTAL: 0.3 mg/dL (ref 0.0–1.2)
BUN/Creatinine Ratio: 11 — ABNORMAL LOW (ref 12–28)
BUN: 9 mg/dL (ref 8–27)
CHLORIDE: 92 mmol/L — AB (ref 96–106)
CO2: 27 mmol/L (ref 20–29)
Calcium: 9.9 mg/dL (ref 8.7–10.3)
Creatinine, Ser: 0.85 mg/dL (ref 0.57–1.00)
GFR calc Af Amer: 81 mL/min/{1.73_m2} (ref >59)
GFR calc non Af Amer: 70 mL/min/{1.73_m2} (ref >59)
GLUCOSE: 85 mg/dL (ref 65–99)
Globulin, Total: 2.1 g/dL (ref 1.5–4.5)
Potassium: 4.1 mmol/L (ref 3.5–5.2)
Sodium: 135 mmol/L (ref 134–144)
Total Protein: 6.9 g/dL (ref 6.0–8.5)

## 2017-04-22 LAB — CARBAMAZEPINE LEVEL, TOTAL: CARBAMAZEPINE LVL: 7.8 ug/mL (ref 4.0–12.0)

## 2017-04-22 NOTE — Telephone Encounter (Signed)
I called the patient.  The blood work shows normalization of the sodium level, now 135.  Chloride remains low, the patient is on a thiazide diuretic.  The patient has a therapeutic carbamazepine level, this does not explain the gait instability.  If amenable, the patient can be set up for MRI of the brain, she will contact our office if she is okay with this.

## 2017-04-23 ENCOUNTER — Other Ambulatory Visit (HOSPITAL_COMMUNITY): Payer: Self-pay | Admitting: Psychiatry

## 2017-04-23 NOTE — Telephone Encounter (Signed)
Pt is responding to message from Dr Jannifer Franklin re: the suggestion of a MRI.  Pt states she lives in [place] and would like to know if it could be done at [facility].  Please call

## 2017-04-23 NOTE — Addendum Note (Signed)
Addended by: Kathrynn Ducking on: 04/23/2017 01:45 PM   Modules accepted: Orders

## 2017-04-23 NOTE — Telephone Encounter (Signed)
I called the patient.  I have no problem with the study being done at [facility], I will get this set up.

## 2017-04-26 ENCOUNTER — Telehealth: Payer: Self-pay | Admitting: Neurology

## 2017-04-26 NOTE — Telephone Encounter (Signed)
Patient is scheduled at [facility] for Friday 04/30/17 arrival time is 9:30 AM. I spoke to the patient she is aware of this time & day.

## 2017-04-30 ENCOUNTER — Ambulatory Visit (HOSPITAL_COMMUNITY)
Admission: RE | Admit: 2017-04-30 | Discharge: 2017-04-30 | Disposition: A | Discharge status: Home / Self Care | Payer: Medicare Other | Source: Ambulatory Visit | Attending: Neurology | Admitting: Neurology

## 2017-04-30 ENCOUNTER — Telehealth: Payer: Self-pay | Admitting: Neurology

## 2017-04-30 DIAGNOSIS — R269 Unspecified abnormalities of gait and mobility: Secondary | ICD-10-CM | POA: Diagnosis not present

## 2017-04-30 DIAGNOSIS — G3281 Cerebellar ataxia in diseases classified elsewhere: Secondary | ICD-10-CM

## 2017-04-30 NOTE — Telephone Encounter (Signed)
I called the patient.  The patient has a normal MRI of the brain.  Blood work has been unremarkable, no evidence of carbamazepine toxicity, sodium levels have corrected.  If the walking and balance remain a problem, we can consider physical therapy.   MRI brain 04/30/17:  IMPRESSION: No explanation for the history. Normal appearance of the cerebellum and brainstem.

## 2017-05-03 NOTE — Telephone Encounter (Signed)
Pt called stating that instead of going to PT she would like to go to a gym to work on walking and balance, stating the treadmill helped her.

## 2017-05-03 NOTE — Telephone Encounter (Signed)
Events noted, the option of working out in the gym is excellent, I would encourage her to continue this.

## 2017-05-06 ENCOUNTER — Ambulatory Visit (INDEPENDENT_AMBULATORY_CARE_PROVIDER_SITE_OTHER): Payer: Medicare Other | Admitting: Psychiatry

## 2017-05-06 ENCOUNTER — Encounter (HOSPITAL_COMMUNITY): Payer: Self-pay | Admitting: Psychiatry

## 2017-05-06 VITALS — BP 117/78 | HR 60 | Ht 66.0 in | Wt 179.2 lb

## 2017-05-06 DIAGNOSIS — F320 Major depressive disorder, single episode, mild: Secondary | ICD-10-CM | POA: Diagnosis not present

## 2017-05-06 DIAGNOSIS — M2550 Pain in unspecified joint: Secondary | ICD-10-CM

## 2017-05-06 DIAGNOSIS — Z811 Family history of alcohol abuse and dependence: Secondary | ICD-10-CM

## 2017-05-06 DIAGNOSIS — Z818 Family history of other mental and behavioral disorders: Secondary | ICD-10-CM

## 2017-05-06 DIAGNOSIS — Z87891 Personal history of nicotine dependence: Secondary | ICD-10-CM | POA: Diagnosis not present

## 2017-05-06 DIAGNOSIS — Z813 Family history of other psychoactive substance abuse and dependence: Secondary | ICD-10-CM | POA: Diagnosis not present

## 2017-05-06 MED ORDER — ALPRAZOLAM 1 MG PO TABS: 1.0000 mg | ORAL_TABLET | Freq: Every evening | ORAL | 3 refills | Status: DC | PRN

## 2017-05-06 MED ORDER — PAROXETINE HCL 10 MG PO TABS: 10.0000 mg | ORAL_TABLET | Freq: Every day | ORAL | 3 refills | Status: DC

## 2017-05-06 NOTE — Progress Notes (Signed)
[facility] MD/PA/NP OP Progress Note  05/06/2017 1:50 PM Erica Bennett  MRN:  [id number]  Chief Complaint:  Chief Complaint    Anxiety; Follow-up     HPI: this patient is a 72 year old married white female who lives with her husband in [place]. They've been married for 14 years. She has no children. She is on disability for congenital blindness.  The patient was referred by Dr.Hosanji, her primary physician, for further assessment of depression anxiety.  The patient states that her mother contracted rubella when she was pregnant with her. She developed congenital blindness that worsened in her 63s with severe macular degeneration. By her 79s she was totally blind. 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  She has learned Braille and gets help through the [employer] the [employer]. She was very comfortable doing things for herself. Both her parents are deceased and she doesn't have any other family. 11 years ago she reconnected with a man that she grown up with all her life. He had a history of alcoholism but claimed he had been sober for 5 years. They got married initially it went okay but then he began drinking. He goes through alcoholic bouts that can last months to years and he usually drinks about a half gallon of vodka a day. He last went through treatment last June but around March of this year he started drinking again. She felt very taken advantage of that she can't see what he is doing but she has been finding the bottles in confronting him and he is been lying about it. This is made her very uncomfortable. He is her power of attorney and they share a bank account. They've been arguing a lot more and she is become increasingly anxious and somewhat depressed.  The patient did have some prior treatment in her 40s when she went  blind. She saw psychiatrist to help deal with the change and also took Paxil for a while. She's not had any treatment since. She's very active in Al-Anon and has several good friends from the program. She knows that she should detach from her alcoholic husband but it's difficult to do so because of her dependency on him.  The patient has always had difficulty sleeping because of the circadian problem with being blind. She has tried [place] and Ambien but they didn't help and the newer drugs for this are extremely expensive. She is on a low dose of Xanax which is helped a little bit but she only gets about 3-4 hours of sleep a night. She is tired through the day, her energy is low. She denies suicidal ideation. She enjoys doing things with friends but will often walk him around because of her husbands behavior. She herself does not drink or use drugs and she's never had psychotic symptoms  The patient returns after 4 months.  She is now taking Plaquenil for an unspecified autoimmune disorder.  She states that since she has been on it she is been feeling a little bit more depressed and irritable.  She cannot do as much as she used to and sometimes she feels "useless and like a burden to my husband."  She denies any thoughts of suicide or self-harm.  The Xanax is helping her sleep but she does seem a little bit more depressed and her  energy has dropped.  I suggested we go back to Paxil because this is something she has been able to tolerate.  Lexapro made her nauseous. Visit Diagnosis:    ICD-10-CM   1. Mild single current episode of major depressive disorder ([place]) F32.0     Past Psychiatric History: Outpatient treatment for depression  Past Medical History:  Past Medical History:  Diagnosis Date  . Blindness of both eyes   . Elevated cholesterol   . GERD (gastroesophageal reflux disease)   . Headache   . Hypertension   . Seizures ([place])   . Skin cancer     Past Surgical History:  Procedure  Laterality Date  . ABDOMINAL HYSTERECTOMY    . Bone spur removed from shoulder    . CHOLECYSTECTOMY    . ENUCLEATION Bilateral   . TONSILLECTOMY      Family Psychiatric History: See below  Family History:  Family History  Problem Relation Age of Onset  . Depression Paternal Aunt   . Alcohol abuse Paternal Uncle   . Drug abuse Paternal Uncle   . Depression Cousin   . Depression Maternal Aunt     Social History:  Social History   Socioeconomic History  . Marital status: Married    Spouse name: Not on file  . Number of children: 0  . Years of education: 17  . Highest education level: Not on file  Occupational History  . Occupation: Retired  Scientific laboratory technician  . Financial resource strain: Not on file  . Food insecurity:    Worry: Not on file    Inability: Not on file  . Transportation needs:    Medical: Not on file    Non-medical: Not on file  Tobacco Use  . Smoking status: Former Research scientist (life sciences)  . Smokeless tobacco: Never Used  . Tobacco comment: Quit 23 years ago  Substance and Sexual Activity  . Alcohol use: No    Alcohol/week: 0.0 oz  . Drug use: No  . Sexual activity: Yes    Birth control/protection: Surgical  Lifestyle  . Physical activity:    Days per week: Not on file    Minutes per session: Not on file  . Stress: Not on file  Relationships  . Social connections:    Talks on phone: Not on file    Gets together: Not on file    Attends religious service: Not on file    Active member of club or organization: Not on file    Attends meetings of clubs or organizations: Not on file    Relationship status: Not on file  Other Topics Concern  . Not on file  Social History Narrative   POA-Dennis   Caffeine use: daily (1 soda per day, 1 coffee per day)   Left handed    Legally blind      Worked as LPN/RN for about 8 years    Allergies:  Allergies  Allergen Reactions  . Bacitracin Rash  . Neomycin Rash  . Sulfa Antibiotics Swelling and Rash  . Sulfamethoxazole  Other (See Comments) and Swelling  . Prozac [Fluoxetine Hcl]     Severe headache    Metabolic Disorder Labs: No results found for: HGBA1C, MPG No results found for: PROLACTIN No results found for: CHOL, TRIG, HDL, CHOLHDL, VLDL, LDLCALC No results found for: TSH  Therapeutic Level Labs: No results found for: LITHIUM No results found for: VALPROATE No components found for:  CBMZ  Current Medications: Current Outpatient Medications  Medication Sig Dispense  Refill  . ALPRAZolam (XANAX) 1 MG tablet Take 1 tablet (1 mg total) by mouth at bedtime as needed for anxiety. 30 tablet 3  . CARBATROL 200 MG 12 hr capsule Take 200 mg by mouth 2 (two) times daily.   3  . DEXILANT 30 MG capsule Take 1 capsule by mouth daily.  1  . diphenhydrAMINE (BENADRYL) 25 MG tablet Take 25 mg by mouth every 6 (six) hours as needed.    . hydrochlorothiazide (HYDRODIURIL) 25 MG tablet Take 25 mg by mouth daily.  0  . HYDROcodone-acetaminophen (NORCO/VICODIN) 5-325 MG tablet Take 1 tablet by mouth every 6 (six) hours as needed for moderate pain (back pain).    . hydroxychloroquine (PLAQUENIL) 200 MG tablet Take 1 tablet by mouth 2 (two) times daily.    Marland Kitchen MAXALT 10 MG tablet Take 10 mg by mouth as needed.   5  . metoprolol succinate (TOPROL-XL) 100 MG 24 hr tablet Take 100 mg by mouth daily.  1  . prednisoLONE acetate (PRED FORTE) 1 % ophthalmic suspension INSTILL ONE DROP IN [facility] EYE TWICE DAILY.    . ranitidine (ZANTAC) 150 MG tablet Take 300 mg by mouth at bedtime.    Marland Kitchen PARoxetine (PAXIL) 10 MG tablet Take 1 tablet (10 mg total) by mouth daily. 30 tablet 3   No current facility-administered medications for this visit.      Musculoskeletal: Strength & Muscle Tone: within normal limits Gait & Station: normal Patient leans: N/A  Psychiatric Specialty Exam: Review of Systems  Constitutional: Positive for malaise/fatigue.  Eyes:       Blindness  Musculoskeletal: Positive for joint pain.   Psychiatric/Behavioral: Positive for depression.  All other systems reviewed and are negative.   Blood pressure 117/78, pulse 60, height 5\' 6"  (1.676 m), weight 179 lb 3.2 oz (81.3 kg), SpO2 100 %.Body mass index is 28.92 kg/m.  General Appearance: Casual, Neat and Well Groomed  Eye Contact:  None patient is blind  Speech:  Clear and Coherent  Volume:  Normal  Mood:  Dysphoric  Affect:  Constricted  Thought Process:  Goal Directed  Orientation:  Full (Time, Place, and Person)  Thought Content: Rumination   Suicidal Thoughts:  No  Homicidal Thoughts:  No  Memory:  Immediate;   Good Recent;   Good Remote;   Good  Judgement:  Good  Insight:  Good  Psychomotor Activity:  Decreased  Concentration:  Concentration: Good and Attention Span: Good  Recall:  Good  Fund of Knowledge: Good  Language: Good  Akathisia:  No  Handed:  Right  AIMS (if indicated): not done  Assets:  Communication Skills Desire for Improvement Resilience Social Support Talents/Skills  ADL's:  Intact  Cognition: WNL  Sleep:  Fair   Screenings:   Assessment and Plan: She is a 72 year old female with a history of depression and anxiety.  For the last couple of years she is done well just with Xanax at bedtime.  However since starting Plaquenil she seems a bit more irritable and depressed.  She will start Paxil 10 mg at bedtime.  She will return to see me in 6 weeks.   [name], MD 05/06/2017, 1:50 PM

## 2017-05-14 DIAGNOSIS — F330 Major depressive disorder, recurrent, mild: Secondary | ICD-10-CM | POA: Diagnosis not present

## 2017-05-14 DIAGNOSIS — I10 Essential (primary) hypertension: Secondary | ICD-10-CM | POA: Diagnosis not present

## 2017-05-14 DIAGNOSIS — M545 Low back pain: Secondary | ICD-10-CM | POA: Diagnosis not present

## 2017-05-14 DIAGNOSIS — K210 Gastro-esophageal reflux disease with esophagitis: Secondary | ICD-10-CM | POA: Diagnosis not present

## 2017-05-25 DIAGNOSIS — J018 Other acute sinusitis: Secondary | ICD-10-CM | POA: Diagnosis not present

## 2017-06-22 ENCOUNTER — Encounter (HOSPITAL_COMMUNITY): Payer: Self-pay | Admitting: Psychiatry

## 2017-06-22 ENCOUNTER — Ambulatory Visit (INDEPENDENT_AMBULATORY_CARE_PROVIDER_SITE_OTHER): Payer: Medicare Other | Admitting: Psychiatry

## 2017-06-22 VITALS — BP 135/81 | HR 65 | Ht 66.0 in | Wt 182.0 lb

## 2017-06-22 DIAGNOSIS — F321 Major depressive disorder, single episode, moderate: Secondary | ICD-10-CM

## 2017-06-22 DIAGNOSIS — Z813 Family history of other psychoactive substance abuse and dependence: Secondary | ICD-10-CM

## 2017-06-22 DIAGNOSIS — Z87891 Personal history of nicotine dependence: Secondary | ICD-10-CM

## 2017-06-22 DIAGNOSIS — H543 Unqualified visual loss, both eyes: Secondary | ICD-10-CM | POA: Diagnosis not present

## 2017-06-22 DIAGNOSIS — Z818 Family history of other mental and behavioral disorders: Secondary | ICD-10-CM | POA: Diagnosis not present

## 2017-06-22 DIAGNOSIS — Z736 Limitation of activities due to disability: Secondary | ICD-10-CM | POA: Diagnosis not present

## 2017-06-22 DIAGNOSIS — M2550 Pain in unspecified joint: Secondary | ICD-10-CM | POA: Diagnosis not present

## 2017-06-22 DIAGNOSIS — Z811 Family history of alcohol abuse and dependence: Secondary | ICD-10-CM

## 2017-06-22 MED ORDER — ALPRAZOLAM 1 MG PO TABS: 1.0000 mg | ORAL_TABLET | Freq: Every evening | ORAL | 3 refills | Status: DC | PRN

## 2017-06-22 NOTE — Progress Notes (Signed)
[facility] MD/PA/NP OP Progress Note  06/22/2017 2:07 PM Erica Bennett  MRN:  [id number]  Chief Complaint:  Chief Complaint    Depression; Anxiety; Follow-up     HPI: this patient is a 72 year old married white female who lives with her husband in [place]. They've been married for 14years. She has no children. She is on disability for congenital blindness.  The patient was referred by Dr.Hosanji, her primary physician, for further assessment of depression anxiety.  The patient states that her mother contracted rubella when she was pregnant with her. She developed congenital blindness that worsened in her 57s with severe macular degeneration. By her 76s she was totally blind. 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  She has learned Braille and gets help through the [employer] the blind. She was very comfortable doing things for herself. Both her parents are deceased and she doesn't have any other family. 11 years ago she reconnected with a man that she grown up with all her life. He had a history of alcoholism but claimed he had been sober for 5 years. They got married initially it went okay but then he began drinking. He goes through alcoholic bouts that can last months to years and he usually drinks about a half gallon of vodka a day. He last went through treatment last June but around March of this year he started drinking again. She felt very taken advantage of that she can't see what he is doing but she has been finding the bottles in confronting him and he is been lying about it. This is made her very uncomfortable. He is her power of attorney and they share a bank account. They've been arguing a lot more and she is become increasingly anxious and somewhat depressed.  The patient did have some prior treatment in her 22s  when she went blind. She saw psychiatrist to help deal with the change and also took Paxil for a while. She's not had any treatment since. She's very active in Al-Anon and has several good friends from the program. She knows that she should detach from her alcoholic husband but it's difficult to do so because of her dependency on him.  The patient has always had difficulty sleeping because of the circadian problem with being blind. She has tried [place] and Ambien but they didn't help and the newer drugs for this are extremely expensive. She is on a low dose of Xanax which is helped a little bit but she only gets about 3-4 hours of sleep a night. She is tired through the day, her energy is low. She denies suicidal ideation. She enjoys doing things with friends She herself does not drink or use drugs and she's never had psychotic symptoms  The patient and her husband return after 6 weeks.  Last time the patient stated she got more depressed and we restarted Paxil.  Unfortunately she had to stop it after 2 weeks.  She said she had a terrible problem with gas.  Interestingly she did not have it in the past when she took it but she states in the past it was in a different form.  Right now however she denies being depressed.  Her mood is good she is sleeping well her energy is low but she thinks is from the autoimmune disorder  that she has.  She denies any suicidal thoughts.  She and her husband just went to the beach and she really enjoyed it.  Her husband has confirmed that she is very upbeat and not depressed Visit Diagnosis:    ICD-10-CM   1. 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 Blindness of both eyes   . Elevated cholesterol   . GERD (gastroesophageal reflux disease)   . Headache   . Hypertension   . Seizures ([place])   . Skin cancer      Past Surgical History:  Procedure Laterality Date  . ABDOMINAL HYSTERECTOMY    . Bone spur removed from shoulder    . CHOLECYSTECTOMY    . ENUCLEATION Bilateral   . TONSILLECTOMY      Family Psychiatric History: See below  Family History:  Family History  Problem Relation Age of Onset  . Depression Paternal Aunt   . Alcohol abuse Paternal Uncle   . Drug abuse Paternal Uncle   . Depression Cousin   . Depression Maternal Aunt     Social History:  Social History   Socioeconomic History  . Marital status: Married    Spouse name: Not on file  . Number of children: 0  . Years of education: 35  . Highest education level: Not on file  Occupational History  . Occupation: Retired  Scientific laboratory technician  . Financial resource strain: Not on file  . Food insecurity:    Worry: Not on file    Inability: Not on file  . Transportation needs:    Medical: Not on file    Non-medical: Not on file  Tobacco Use  . Smoking status: Former Research scientist (life sciences)  . Smokeless tobacco: Never Used  . Tobacco comment: Quit 23 years ago  Substance and Sexual Activity  . Alcohol use: No    Alcohol/week: 0.0 oz  . Drug use: No  . Sexual activity: Yes    Birth control/protection: Surgical  Lifestyle  . Physical activity:    Days per week: Not on file    Minutes per session: Not on file  . Stress: Not on file  Relationships  . Social connections:    Talks on phone: Not on file    Gets together: Not on file    Attends religious service: Not on file    Active member of club or organization: Not on file    Attends meetings of clubs or organizations: Not on file    Relationship status: Not on file  Other Topics Concern  . Not on file  Social History Narrative   POA-Dennis   Caffeine use: daily (1 soda per day, 1 coffee per day)   Left handed    Legally blind      Worked as LPN/RN for about 8 years    Allergies:  Allergies  Allergen Reactions  . Bacitracin Rash  . Neomycin Rash  . Sulfa  Antibiotics Swelling and Rash  . Sulfamethoxazole Other (See Comments) and Swelling  . Prozac [Fluoxetine Hcl]     Severe headache    Metabolic Disorder Labs: No results found for: HGBA1C, MPG No results found for: PROLACTIN No results found for: CHOL, TRIG, HDL, CHOLHDL, VLDL, LDLCALC No results found for: TSH  Therapeutic Level Labs: No results found for: LITHIUM No results found for: VALPROATE No components found for:  CBMZ  Current Medications: Current Outpatient Medications  Medication Sig Dispense Refill  . ALPRAZolam (XANAX) 1 MG tablet Take 1 tablet (1 mg total) by mouth at bedtime as needed for anxiety. 30 tablet 3  . CARBATROL 200 MG 12 hr capsule Take 200 mg by mouth 2 (two) times daily.   3  . DEXILANT 30 MG capsule Take 1 capsule by mouth daily.  1  . diphenhydrAMINE (BENADRYL) 25 MG tablet Take 25 mg by mouth every 6 (six) hours as needed.    . hydrochlorothiazide (HYDRODIURIL) 25 MG tablet Take 25 mg by mouth daily.  0  . HYDROcodone-acetaminophen (NORCO/VICODIN) 5-325 MG tablet Take 1 tablet by mouth every 6 (six) hours as needed for moderate pain (back pain).    . hydroxychloroquine (PLAQUENIL) 200 MG tablet Take 1 tablet by mouth 2 (two) times daily.    Marland Kitchen MAXALT 10 MG tablet Take 10 mg by mouth as needed.   5  . metoprolol succinate (TOPROL-XL) 100 MG 24 hr tablet Take 100 mg by mouth daily.  1  . prednisoLONE acetate (PRED FORTE) 1 % ophthalmic suspension INSTILL ONE DROP IN [facility] EYE TWICE DAILY.    . ranitidine (ZANTAC) 150 MG tablet Take 300 mg by mouth at bedtime.     No current facility-administered medications for this visit.      Musculoskeletal: Strength & Muscle Tone: within normal limits Gait & Station: normal Patient leans: N/A  Psychiatric Specialty Exam: Review of Systems  Constitutional: Positive for malaise/fatigue.  Musculoskeletal: Positive for joint pain.  All other systems reviewed and are negative.   Blood pressure 135/81, pulse 65,  height 5\' 6"  (1.676 m), weight 182 lb (82.6 kg), SpO2 97 %.Body mass index is 29.38 kg/m.  General Appearance: Casual, Neat and Well Groomed  Eye Contact:  None patient is blind  Speech:  Clear and Coherent  Volume:  Increased  Mood:  Euthymic  Affect:  Congruent  Thought Process:  Goal Directed  Orientation:  Full (Time, Place, and Person)  Thought Content: WDL   Suicidal Thoughts:  No  Homicidal Thoughts:  No  Memory:  Immediate;   Good Recent;   Good Remote;   Good  Judgement:  Good  Insight:  Good  Psychomotor Activity:  Decreased  Concentration:  Concentration: Good and Attention Span: Good  Recall:  Good  Fund of Knowledge: Good  Language: Good  Akathisia:  No  Handed:  Right  AIMS (if indicated): not done  Assets:  Communication Skills Desire for Improvement Resilience Social Support Talents/Skills  ADL's:  Intact  Cognition: WNL  Sleep:  Good   Screenings:   Assessment and Plan: Patient is a 72 year old female with a history of depression and anxiety as well as blindness.  Right now she states she is not suffering from any symptoms of depression.  She still uses the Xanax to help her sleep as needed so she will continue 1 mg daily.  If she feels like she is getting depressed again she will call me and we will consider another medication.  Otherwise she will return to see me in 4 months   [name], MD 06/22/2017, 2:07 PM

## 2017-07-02 DIAGNOSIS — K210 Gastro-esophageal reflux disease with esophagitis: Secondary | ICD-10-CM | POA: Diagnosis not present

## 2017-07-02 DIAGNOSIS — F320 Major depressive disorder, single episode, mild: Secondary | ICD-10-CM | POA: Diagnosis not present

## 2017-07-02 DIAGNOSIS — I10 Essential (primary) hypertension: Secondary | ICD-10-CM | POA: Diagnosis not present

## 2017-07-13 DIAGNOSIS — K210 Gastro-esophageal reflux disease with esophagitis: Secondary | ICD-10-CM | POA: Diagnosis not present

## 2017-07-13 DIAGNOSIS — J988 Other specified respiratory disorders: Secondary | ICD-10-CM | POA: Diagnosis not present

## 2017-07-13 DIAGNOSIS — I10 Essential (primary) hypertension: Secondary | ICD-10-CM | POA: Diagnosis not present

## 2017-07-15 DIAGNOSIS — C44311 Basal cell carcinoma of skin of nose: Secondary | ICD-10-CM | POA: Diagnosis not present

## 2017-07-15 DIAGNOSIS — L820 Inflamed seborrheic keratosis: Secondary | ICD-10-CM | POA: Diagnosis not present

## 2017-08-12 DIAGNOSIS — R768 Other specified abnormal immunological findings in serum: Secondary | ICD-10-CM | POA: Diagnosis not present

## 2017-08-12 DIAGNOSIS — E041 Nontoxic single thyroid nodule: Secondary | ICD-10-CM | POA: Diagnosis not present

## 2017-08-12 DIAGNOSIS — L509 Urticaria, unspecified: Secondary | ICD-10-CM | POA: Diagnosis not present

## 2017-08-12 DIAGNOSIS — E663 Overweight: Secondary | ICD-10-CM | POA: Diagnosis not present

## 2017-08-12 DIAGNOSIS — M2550 Pain in unspecified joint: Secondary | ICD-10-CM | POA: Diagnosis not present

## 2017-08-12 DIAGNOSIS — Z6829 Body mass index (BMI) 29.0-29.9, adult: Secondary | ICD-10-CM | POA: Diagnosis not present

## 2017-08-12 DIAGNOSIS — R21 Rash and other nonspecific skin eruption: Secondary | ICD-10-CM | POA: Diagnosis not present

## 2017-08-12 DIAGNOSIS — M359 Systemic involvement of connective tissue, unspecified: Secondary | ICD-10-CM | POA: Diagnosis not present

## 2017-08-12 DIAGNOSIS — R05 Cough: Secondary | ICD-10-CM | POA: Diagnosis not present

## 2017-08-12 DIAGNOSIS — R29818 Other symptoms and signs involving the nervous system: Secondary | ICD-10-CM | POA: Diagnosis not present

## 2017-08-19 DIAGNOSIS — Z08 Encounter for follow-up examination after completed treatment for malignant neoplasm: Secondary | ICD-10-CM | POA: Diagnosis not present

## 2017-08-19 DIAGNOSIS — Z85828 Personal history of other malignant neoplasm of skin: Secondary | ICD-10-CM | POA: Diagnosis not present

## 2017-08-19 DIAGNOSIS — L820 Inflamed seborrheic keratosis: Secondary | ICD-10-CM | POA: Diagnosis not present

## 2017-08-20 DIAGNOSIS — I10 Essential (primary) hypertension: Secondary | ICD-10-CM | POA: Diagnosis not present

## 2017-08-20 DIAGNOSIS — F3289 Other specified depressive episodes: Secondary | ICD-10-CM | POA: Diagnosis not present

## 2017-08-20 DIAGNOSIS — K210 Gastro-esophageal reflux disease with esophagitis: Secondary | ICD-10-CM | POA: Diagnosis not present

## 2017-08-26 ENCOUNTER — Ambulatory Visit (INDEPENDENT_AMBULATORY_CARE_PROVIDER_SITE_OTHER): Payer: Medicare Other | Admitting: Neurology

## 2017-08-26 ENCOUNTER — Encounter: Payer: Self-pay | Admitting: Neurology

## 2017-08-26 VITALS — BP 122/81 | HR 60 | Ht 66.0 in | Wt 179.5 lb

## 2017-08-26 DIAGNOSIS — R269 Unspecified abnormalities of gait and mobility: Secondary | ICD-10-CM | POA: Diagnosis not present

## 2017-08-26 DIAGNOSIS — G43009 Migraine without aura, not intractable, without status migrainosus: Secondary | ICD-10-CM

## 2017-08-26 NOTE — Progress Notes (Signed)
Reason for visit: Seizures, gait disturbance, headache  Erica Bennett is an 72 y.o. female  History of present illness:  Erica Bennett is a 72 year old left-handed white female with a history of seizure type events, well controlled on carbamazepine.  The use of metoprolol has significantly improved her migraine frequency, she does have weather induced headaches, they are less frequent and less severe than usual.  If she does wake up with a headache they may last 2 to 3 days.  The patient has had some gait instability issues, this has improved since last seen, MRI of the brain done previously was unremarkable.  Carbamazepine level was therapeutic.  The patient has not had any falls.  She will occasionally note some dizziness, pounding of the heart with standing up after being in a seated position while riding in a car.  This generally does not happen at home when she is sitting in a chair and then stands up.  The patient has not had any blacking out episodes.  She returns for an evaluation.  Past Medical History:  Diagnosis Date  . Blindness of both eyes   . Elevated cholesterol   . GERD (gastroesophageal reflux disease)   . Headache   . Hypertension   . Seizures ([place])   . Skin cancer     Past Surgical History:  Procedure Laterality Date  . ABDOMINAL HYSTERECTOMY    . Bone spur removed from shoulder    . CHOLECYSTECTOMY    . ENUCLEATION Bilateral   . TONSILLECTOMY      Family History  Problem Relation Age of Onset  . Depression Paternal Aunt   . Alcohol abuse Paternal Uncle   . Drug abuse Paternal Uncle   . Depression Cousin   . Depression Maternal Aunt     Social history:  reports that she has quit smoking. She has never used smokeless tobacco. She reports that she does not drink alcohol or use drugs.    Allergies  Allergen Reactions  . Bacitracin Rash  . Neomycin Rash  . Sulfa Antibiotics Swelling and Rash  . Sulfamethoxazole Other (See Comments) and Swelling  .  Prozac [Fluoxetine Hcl]     Severe headache    Medications:  Prior to Admission medications   Medication Sig Start Date End Date Taking? Authorizing Provider  ALPRAZolam Duanne Moron) 1 MG tablet Take 1 tablet (1 mg total) by mouth at bedtime as needed for anxiety. 06/22/17 06/22/18 Yes Cloria Spring, MD  CARBATROL 200 MG 12 hr capsule Take 200 mg by mouth 2 (two) times daily.  05/22/14  Yes [provider]  DEXILANT 30 MG capsule Take 1 capsule by mouth daily. 04/12/14  Yes [provider]  diphenhydrAMINE (BENADRYL) 25 MG tablet Take 25 mg by mouth every 6 (six) hours as needed.   Yes [provider]  hydrochlorothiazide (HYDRODIURIL) 25 MG tablet Take 25 mg by mouth daily. 10/22/14  Yes [provider]  HYDROcodone-acetaminophen (NORCO/VICODIN) 5-325 MG tablet Take 1 tablet by mouth every 6 (six) hours as needed for moderate pain (back pain).   Yes [provider]  hydroxychloroquine (PLAQUENIL) 200 MG tablet Take 1 tablet by mouth 2 (two) times daily.   Yes [provider]  MAXALT 10 MG tablet Take 10 mg by mouth as needed.  05/08/14  Yes [provider]  metoprolol succinate (TOPROL-XL) 100 MG 24 hr tablet Take 100 mg by mouth daily. 05/08/14  Yes [provider]  prednisoLONE acetate (PRED FORTE)  1 % ophthalmic suspension INSTILL ONE DROP IN [facility] EYE TWICE DAILY. 06/05/16  Yes [provider]  ranitidine (ZANTAC) 150 MG tablet Take 300 mg by mouth at bedtime.   Yes [provider]  sucralfate (CARAFATE) 1 g tablet Take 1 g by mouth 2 (two) times daily.   Yes [provider]    ROS:  Out of a complete 14 system review of symptoms, the patient complains only of the following symptoms, and all other reviewed systems are negative.  Decreased activity, chills, fatigue Ringing in ears, difficulty swallowing Loss of vision Cough Cold intolerance Abdominal pain, diarrhea, incontinence of the  bowels Insomnia, frequent waking, snoring Environmental allergies, food allergies Joint pain, back pain, aching muscles, walking difficulty, neck stiffness Swollen lymph nodes Memory loss, dizziness, headache, numbness, seizures, weakness, tremor  Blood pressure 122/81, pulse 60, height 5\' 6"  (1.676 m), weight 179 lb 8 oz (81.4 kg), SpO2 98 %.   Blood pressure, sitting, right arm is 126/84.  Blood pressure, right arm, standing is 482 systolic.  Physical Exam  General: The patient is alert and cooperative at the time of the examination.  Skin: No significant peripheral edema is noted.   Neurologic Exam  Mental status: The patient is alert and oriented x 3 at the time of the examination. The patient has apparent normal recent and remote memory, with an apparently normal attention span and concentration ability.   Cranial nerves: Facial symmetry is present. Speech is normal, no aphasia or dysarthria is noted.  The patient is blind.  Motor: The patient has good strength in all 4 extremities.  Sensory examination: Soft touch sensation is symmetric on the face, arms, and legs.  Coordination: The patient has good finger-nose-finger and heel-to-shin bilaterally.  Gait and station: The patient has a normal gait. Tandem gait is normal. Romberg is negative. No drift is seen.  Reflexes: Deep tendon reflexes are symmetric.   MRI brain 04/30/17:  IMPRESSION: No explanation for the history. Normal appearance of the cerebellum and brainstem.   Assessment/Plan:  1.  Gait disturbance, improved  2.  Migraine headache  3.  Seizures  The patient is doing well at this point.  Her ability to walk seems to be fairly normal currently.  The patient does report some intermittent dizziness with standing after riding in a car.  The patient reports some pounding of the heart and whooshing noise in the ears, these events likely represent a transient drop in blood pressure.  The patient is on  hydrochlorthiazide and metoprolol.  She will be followed over time, she does not appear to be orthostatic today.  She will follow-up in 6 months.  Jill Alexanders MD 08/26/2017 11:23 AM  [facility] [address] [place], [place] [address]  Phone [phone] Fax [phone]

## 2017-09-21 DIAGNOSIS — R300 Dysuria: Secondary | ICD-10-CM | POA: Diagnosis not present

## 2017-09-21 DIAGNOSIS — N949 Unspecified condition associated with female genital organs and menstrual cycle: Secondary | ICD-10-CM | POA: Diagnosis not present

## 2017-10-06 ENCOUNTER — Other Ambulatory Visit (INDEPENDENT_AMBULATORY_CARE_PROVIDER_SITE_OTHER): Payer: Self-pay | Admitting: Otolaryngology

## 2017-10-06 DIAGNOSIS — E041 Nontoxic single thyroid nodule: Secondary | ICD-10-CM

## 2017-10-07 ENCOUNTER — Ambulatory Visit (HOSPITAL_COMMUNITY)
Admission: RE | Admit: 2017-10-07 | Discharge: 2017-10-07 | Disposition: A | Discharge status: Home / Self Care | Payer: Medicare Other | Source: Ambulatory Visit | Attending: Otolaryngology | Admitting: Otolaryngology

## 2017-10-07 ENCOUNTER — Ambulatory Visit (INDEPENDENT_AMBULATORY_CARE_PROVIDER_SITE_OTHER): Payer: Medicare Other | Admitting: Otolaryngology

## 2017-10-07 DIAGNOSIS — R05 Cough: Secondary | ICD-10-CM | POA: Diagnosis not present

## 2017-10-07 DIAGNOSIS — E041 Nontoxic single thyroid nodule: Secondary | ICD-10-CM | POA: Diagnosis present

## 2017-10-07 DIAGNOSIS — E042 Nontoxic multinodular goiter: Secondary | ICD-10-CM | POA: Insufficient documentation

## 2017-10-13 DIAGNOSIS — I10 Essential (primary) hypertension: Secondary | ICD-10-CM | POA: Diagnosis not present

## 2017-10-13 DIAGNOSIS — J988 Other specified respiratory disorders: Secondary | ICD-10-CM | POA: Diagnosis not present

## 2017-10-13 DIAGNOSIS — Z0000 Encounter for general adult medical examination without abnormal findings: Secondary | ICD-10-CM | POA: Diagnosis not present

## 2017-10-13 DIAGNOSIS — Z1389 Encounter for screening for other disorder: Secondary | ICD-10-CM | POA: Diagnosis not present

## 2017-10-13 DIAGNOSIS — K210 Gastro-esophageal reflux disease with esophagitis: Secondary | ICD-10-CM | POA: Diagnosis not present

## 2017-10-26 ENCOUNTER — Encounter (HOSPITAL_COMMUNITY): Payer: Self-pay | Admitting: Psychiatry

## 2017-10-26 ENCOUNTER — Ambulatory Visit (INDEPENDENT_AMBULATORY_CARE_PROVIDER_SITE_OTHER): Payer: Medicare Other | Admitting: Psychiatry

## 2017-10-26 VITALS — BP 122/83 | HR 66 | Ht 66.0 in | Wt 182.0 lb

## 2017-10-26 DIAGNOSIS — F321 Major depressive disorder, single episode, moderate: Secondary | ICD-10-CM

## 2017-10-26 DIAGNOSIS — Z87891 Personal history of nicotine dependence: Secondary | ICD-10-CM | POA: Diagnosis not present

## 2017-10-26 LAB — THYROID PANEL WITH TSH
FREE THYROXINE INDEX: 1.9 (ref 1.4–3.8)
T3 Uptake: 32 % (ref 22–35)
T4, Total: 6 ug/dL (ref 5.1–11.9)
TSH: 1.56 mIU/L (ref 0.40–4.50)

## 2017-10-26 MED ORDER — ALPRAZOLAM 1 MG PO TABS: 1.0000 mg | ORAL_TABLET | Freq: Every evening | ORAL | 3 refills | Status: DC | PRN

## 2017-10-26 NOTE — Progress Notes (Signed)
[place] MD/PA/NP OP Progress Note  10/26/2017 1:36 PM Erica Bennett  MRN:  [id number]  Chief Complaint:  Chief Complaint    Anxiety; Follow-up     HPI:  this patient is a 72 year old married white female who lives with her husband in [place]. They've been married for 16years. She has no children. She is on disability for congenital blindness.  The patient was referred by Dr.Hosanji, her primary physician, for further assessment of depression anxiety.  The patient states that her mother contracted rubella when she was pregnant with her. She developed congenital blindness that worsened in her 4s with severe macular degeneration. By her 66s she was totally blind. 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  She has learned Braille and gets help through the [employer]. She was very comfortable doing things for herself. Both her parents are deceased and she doesn't have any other family. 11 years ago she reconnected with a man that she grown up with all her life. He had a history of alcoholism but claimed he had been sober for 5 years. They got married initially it went okay but then he began drinking. He goes through alcoholic bouts that can last months to years and he usually drinks about a half gallon of vodka a day. He last went through treatment last June but around March of this year he started drinking again. She felt very taken advantage of that she can't see what he is doing but she has been finding the bottles in confronting him and he is been lying about it. This is made her very uncomfortable. He is her power of attorney and they share a bank account. They've been arguing a lot more and she is become increasingly anxious and somewhat depressed.  The patient did have some prior treatment in her 14s when she  went blind. She saw psychiatrist to help deal with the change and also took Paxil for a while. She's not had any treatment since. She's very active in [employer]-An[employer] and has several good friends from the program. She knows that she should detach from her alcoholic husband but it's difficult to do so because of her dependency on him.  The patient has always had difficulty sleeping because of the circadian problem with being blind. She has tried [place] and Ambien but they didn't help and the newer drugs for this are extremely expensive. She is on a low dose of Xanax which is helped a little bit but she only gets about 3-4 hours of sleep a night. She is tired through the day, her energy is low. She denies suicidal ideation. She enjoys doing things with friends She herself does not drink or use drugs and she's never had psychotic symptoms  The patient returns for follow-up today.  She states that she has had a bit of a stressful.  Because her husband's son died a few weeks ago.  He was diagnosed with colon cancer in had a complication during an enema which perforated his colon and led to all sorts of medical issues.  This was very difficult for the entire family.  However she has regained her equilibrium and she is sleeping fairly well for her.  This usually means up to 5 hours of sleep with the Xanax.  She knows there are new drugs  for people with circadian difficulties from blindness but she states that she looked into it and her insurance will not cover it.  She is happy with the sleep she is getting right now and does not feel overly fatigued.  She denies being depressed.  She recently saw an ENT and was found that she has 2 thyroid nodules.  She states that her primary doctor did not check her thyroid panel and will go ahead and do this today.  She is going to be starting with Dr. Livia Snellen is a new physician at [facility]. Visit Diagnosis:    ICD-10-CM   1. Moderate single current episode of major  depressive disorder ([place]) F32.1 Thyroid Panel With TSH    Past Psychiatric History: Outpatient treatment for depression  Past Medical History:  Past Medical History:  Diagnosis Date  . Blindness of both eyes   . Elevated cholesterol   . GERD (gastroesophageal reflux disease)   . Headache   . Hypertension   . Seizures ([place])   . Skin cancer     Past Surgical History:  Procedure Laterality Date  . ABDOMINAL HYSTERECTOMY    . Bone spur removed from shoulder    . CHOLECYSTECTOMY    . ENUCLEATION Bilateral   . TONSILLECTOMY      Family Psychiatric History: See below  Family History:  Family History  Problem Relation Age of Onset  . Depression Paternal Aunt   . Alcohol abuse Paternal Uncle   . Drug abuse Paternal Uncle   . Depression Cousin   . Depression Maternal Aunt     Social History:  Social History   Socioeconomic History  . Marital status: Married    Spouse name: Not on file  . Number of children: 0  . Years of education: 66  . Highest education level: Not on file  Occupational History  . Occupation: Retired  Scientific laboratory technician  . Financial resource strain: Not on file  . Food insecurity:    Worry: Not on file    Inability: Not on file  . Transportation needs:    Medical: Not on file    Non-medical: Not on file  Tobacco Use  . Smoking status: Former Research scientist (life sciences)  . Smokeless tobacco: Never Used  . Tobacco comment: Quit 23 years ago  Substance and Sexual Activity  . Alcohol use: No    Alcohol/week: 0.0 standard drinks  . Drug use: No  . Sexual activity: Yes    Birth control/protection: Surgical  Lifestyle  . Physical activity:    Days per week: Not on file    Minutes per session: Not on file  . Stress: Not on file  Relationships  . Social connections:    Talks on phone: Not on file    Gets together: Not on file    Attends religious service: Not on file    Active member of club or organization: Not on file    Attends meetings of clubs or organizations:  Not on file    Relationship status: Not on file  Other Topics Concern  . Not on file  Social History Narrative   [employer]   Caffeine use: daily (1 soda per day, 1 coffee per day)   Left handed    Legally blind      Worked as LPN/RN for about 8 years    Allergies:  Allergies  Allergen Reactions  . Bacitracin Rash  . Neomycin Rash  . Sulfa Antibiotics Swelling and Rash  . Sulfamethoxazole  Other (See Comments) and Swelling  . Prozac [Fluoxetine Hcl]     Severe headache    Metabolic Disorder Labs: No results found for: HGBA1C, MPG No results found for: PROLACTIN No results found for: CHOL, TRIG, HDL, CHOLHDL, VLDL, LDLCALC No results found for: TSH  Therapeutic Level Labs: No results found for: LITHIUM No results found for: VALPROATE No components found for:  CBMZ  Current Medications: Current Outpatient Medications  Medication Sig Dispense Refill  . ALPRAZolam (XANAX) 1 MG tablet Take 1 tablet (1 mg total) by mouth at bedtime as needed for anxiety. 30 tablet 3  . CARBATROL 200 MG 12 hr capsule Take 200 mg by mouth 2 (two) times daily.   3  . DEXILANT 30 MG capsule Take 1 capsule by mouth daily.  1  . diphenhydrAMINE (BENADRYL) 25 MG tablet Take 25 mg by mouth every 6 (six) hours as needed.    . hydrochlorothiazide (HYDRODIURIL) 25 MG tablet Take 25 mg by mouth daily.  0  . HYDROcodone-acetaminophen (NORCO/VICODIN) 5-325 MG tablet Take 1 tablet by mouth every 6 (six) hours as needed for moderate pain (back pain).    . hydroxychloroquine (PLAQUENIL) 200 MG tablet Take 1 tablet by mouth 2 (two) times daily.    Marland Kitchen MAXALT 10 MG tablet Take 10 mg by mouth as needed.   5  . metoprolol succinate (TOPROL-XL) 100 MG 24 hr tablet Take 100 mg by mouth daily.  1  . prednisoLONE acetate (PRED FORTE) 1 % ophthalmic suspension INSTILL ONE DROP IN [facility] EYE TWICE DAILY.    . ranitidine (ZANTAC) 150 MG tablet Take 300 mg by mouth at bedtime.    . sucralfate (CARAFATE) 1 g tablet Take 1 g  by mouth 2 (two) times daily.     No current facility-administered medications for this visit.      Musculoskeletal: Strength & Muscle Tone: within normal limits Gait & Station: normal Patient leans: N/A  Psychiatric Specialty Exam: Review of Systems  Respiratory: Positive for cough.   All other systems reviewed and are negative.   Blood pressure 122/83, pulse 66, height 5\' 6"  (1.676 m), weight 182 lb (82.6 kg), SpO2 96 %.Body mass index is 29.38 kg/m.  General Appearance: Casual, Neat and Well Groomed  Eye Contact: None, patient is blind  Speech:  Clear and Coherent  Volume:  Normal  Mood:  Euthymic  Affect:  Congruent  Thought Process:  Goal Directed  Orientation:  Full (Time, Place, and Person)  Thought Content: WDL   Suicidal Thoughts:  No  Homicidal Thoughts:  No  Memory:  Immediate;   Good Recent;   Good Remote;   Good  Judgement:  Good  Insight:  Good  Psychomotor Activity:  Normal  Concentration:  Concentration: Good and Attention Span: Good  Recall:  Good  Fund of Knowledge: Good  Language: Good  Akathisia:  No  Handed:  Right  AIMS (if indicated): not done  Assets:  Communication Skills Desire for Improvement Resilience Social Support Talents/Skills  ADL's:  Intact  Cognition: WNL  Sleep:  Fair   Screenings:   Assessment and Plan: This patient is a 72 year old female with congenital blindness and a history of depression and anxiety.  She denies being depressed now.  She is feeling much better since her rheumatoid condition is being treated with Plaquenil.  The Xanax is helping her sleep.  However she has newly discovered thyroid nodules.  I will go ahead and order a thyroid panel with TSH so  that her primary doctor will have this available at her visit this month.  She will return to see me in 4 months   [name], MD 10/26/2017, 1:36 PM

## 2017-10-28 DIAGNOSIS — R300 Dysuria: Secondary | ICD-10-CM | POA: Diagnosis not present

## 2017-11-04 ENCOUNTER — Ambulatory Visit (INDEPENDENT_AMBULATORY_CARE_PROVIDER_SITE_OTHER): Payer: Medicare Other | Admitting: Otolaryngology

## 2017-11-04 DIAGNOSIS — K219 Gastro-esophageal reflux disease without esophagitis: Secondary | ICD-10-CM

## 2017-11-04 DIAGNOSIS — R05 Cough: Secondary | ICD-10-CM

## 2017-11-04 DIAGNOSIS — D440 Neoplasm of uncertain behavior of thyroid gland: Secondary | ICD-10-CM | POA: Diagnosis not present

## 2017-11-05 ENCOUNTER — Other Ambulatory Visit (INDEPENDENT_AMBULATORY_CARE_PROVIDER_SITE_OTHER): Payer: Self-pay | Admitting: Otolaryngology

## 2017-11-05 DIAGNOSIS — E041 Nontoxic single thyroid nodule: Secondary | ICD-10-CM

## 2017-11-16 ENCOUNTER — Encounter: Payer: Self-pay | Admitting: Family Medicine

## 2017-11-16 ENCOUNTER — Ambulatory Visit (INDEPENDENT_AMBULATORY_CARE_PROVIDER_SITE_OTHER): Payer: Medicare Other | Admitting: Family Medicine

## 2017-11-16 VITALS — BP 99/62 | HR 73 | Temp 97.3°F | Ht 66.0 in | Wt 184.0 lb

## 2017-11-16 DIAGNOSIS — F321 Major depressive disorder, single episode, moderate: Secondary | ICD-10-CM | POA: Diagnosis not present

## 2017-11-16 DIAGNOSIS — K580 Irritable bowel syndrome with diarrhea: Secondary | ICD-10-CM

## 2017-11-16 DIAGNOSIS — E782 Mixed hyperlipidemia: Secondary | ICD-10-CM

## 2017-11-16 DIAGNOSIS — Z136 Encounter for screening for cardiovascular disorders: Secondary | ICD-10-CM

## 2017-11-16 DIAGNOSIS — I951 Orthostatic hypotension: Secondary | ICD-10-CM

## 2017-11-16 DIAGNOSIS — F1120 Opioid dependence, uncomplicated: Secondary | ICD-10-CM

## 2017-11-16 DIAGNOSIS — K2100 Gastro-esophageal reflux disease with esophagitis, without bleeding: Secondary | ICD-10-CM

## 2017-11-16 DIAGNOSIS — E010 Iodine-deficiency related diffuse (endemic) goiter: Secondary | ICD-10-CM | POA: Diagnosis not present

## 2017-11-16 DIAGNOSIS — I714 Abdominal aortic aneurysm, without rupture, unspecified: Secondary | ICD-10-CM

## 2017-11-16 DIAGNOSIS — H543 Unqualified visual loss, both eyes: Secondary | ICD-10-CM

## 2017-11-16 DIAGNOSIS — G40409 Other generalized epilepsy and epileptic syndromes, not intractable, without status epilepticus: Secondary | ICD-10-CM

## 2017-11-16 DIAGNOSIS — M069 Rheumatoid arthritis, unspecified: Secondary | ICD-10-CM | POA: Diagnosis not present

## 2017-11-16 DIAGNOSIS — I10 Essential (primary) hypertension: Secondary | ICD-10-CM

## 2017-11-16 MED ORDER — GABAPENTIN 300 MG PO CAPS: 300.0000 mg | ORAL_CAPSULE | Freq: Every day | ORAL | 1 refills | Status: DC

## 2017-11-16 MED ORDER — HYDROXYCHLOROQUINE SULFATE 200 MG PO TABS: 200.0000 mg | ORAL_TABLET | Freq: Two times a day (BID) | ORAL | 1 refills | Status: AC

## 2017-11-16 MED ORDER — METOPROLOL SUCCINATE ER 100 MG PO TB24: 100.0000 mg | ORAL_TABLET | Freq: Every day | ORAL | 1 refills | Status: DC

## 2017-11-16 MED ORDER — HYDROCHLOROTHIAZIDE 25 MG PO TABS: 25.0000 mg | ORAL_TABLET | Freq: Every day | ORAL | 1 refills | Status: DC

## 2017-11-16 MED ORDER — CARBATROL 200 MG PO CP12: 200.0000 mg | ORAL_CAPSULE | Freq: Two times a day (BID) | ORAL | 1 refills | Status: DC

## 2017-11-16 MED ORDER — DEXILANT 30 MG PO CPDR: 1.0000 | DELAYED_RELEASE_CAPSULE | Freq: Every day | ORAL | 1 refills | Status: DC

## 2017-11-16 NOTE — Progress Notes (Signed)
Subjective:  Patient ID: SONIYA ASHRAF, female    DOB: 01-13-1946  Age: 72 y.o. MRN: [id number]  CC: New Patient (Initial Visit)   HPI ASHIMA SHRAKE presents for new patient evaluation. She is completely blind. Her mother contracted measles during pregnancy with the patient. She has bilateral eye prostheses. She suffers from a rheumatoid condition that is not rheumatoid arthritis, but another dx in the connective tissue disorders arena. Followed by rheumatology P.A. Marella Chimes.  She suffers from severe back and shoulder pain. Other joints involved as well.She takes multiple medications for pain. Those have been refilled by her former doctor whom she states "never lays a hand on her." These include opiates and a small dose of gabapentin.  She also has an abdominal aortic aneurysm. Her family has a history of rupturing similar aneurysms at 4.5 cm rather than at the usually predicted size. She would like to have an updated evaluation. Pt. Relates problems with irritable bowel with mixed constipation and diarrhea, but mostly diarrhea. Additionally she takes a daily PPI, dexilant 30 mg daily,  for GERD sx including substernal burning. She states control is not good and she supplements with Pepcid. Pt. Also reports that she has daily bouts of lightheadedness. These frequently occur with standing up. Blood pressure control has been variable. She takes multiple medications and wonders why this is needed.   Pt. Has had epilepsy from childhood. She is managed with carbatrol. No seizures recently.   Depression screen PHQ 2/9 11/16/2017  Decreased Interest 0  Down, Depressed, Hopeless 0  PHQ - 2 Score 0    History [place] has a past medical history of Blindness of both eyes, Elevated cholesterol, GERD (gastroesophageal reflux disease), Headache, Hypertension, Seizures ([place]), and Skin cancer.   She has a past surgical history that includes Enucleation (Bilateral); Abdominal hysterectomy; Tonsillectomy;  Cholecystectomy; and Bone spur removed from shoulder.   Her family history includes Alcohol abuse in her paternal uncle; Depression in her cousin, maternal aunt, and paternal aunt; Drug abuse in her paternal uncle.She reports that she has quit smoking. She has never used smokeless tobacco. She reports that she does not drink alcohol or use drugs.    ROS Review of Systems  Constitutional: Negative for appetite change, chills, diaphoresis, fatigue, fever and unexpected weight change.  HENT: Positive for trouble swallowing (dysphagia at esophageal level). Negative for congestion, ear pain, hearing loss, postnasal drip, rhinorrhea, sneezing and sore throat.   Eyes: Positive for discharge (uses Pred forte drops), itching and visual disturbance (complete blindness). Negative for pain.  Respiratory: Negative for cough, chest tightness and shortness of breath.   Cardiovascular: Negative for chest pain and palpitations.  Gastrointestinal: Negative for abdominal pain, constipation, diarrhea, nausea and vomiting.  Endocrine: Negative for cold intolerance, heat intolerance, polydipsia, polyphagia and polyuria.  Genitourinary: Negative for dysuria, frequency and menstrual problem.  Musculoskeletal: Positive for arthralgias, back pain, joint swelling and myalgias.  Skin: Negative for rash.  Allergic/Immunologic: Negative for environmental allergies.  Neurological: Positive for dizziness, seizures and light-headedness. Negative for syncope (but has had orthastatic changes with near-syncope), weakness, numbness and headaches.  Psychiatric/Behavioral: Positive for dysphoric mood. Negative for agitation.    Objective:  BP 99/62   Pulse 73   Temp (!) 97.3 F (36.3 C) (Oral)   Ht '5\' 6"'$  (1.676 m)   Wt 184 lb (83.5 kg)   BMI 29.70 kg/m   BP Readings from Last 3 Encounters:  11/17/17 138/67  11/16/17 99/62  08/26/17 122/81  Wt Readings from Last 3 Encounters:  11/16/17 184 lb (83.5 kg)  08/26/17  179 lb 8 oz (81.4 kg)  04/21/17 180 lb (81.6 kg)     Physical Exam  Constitutional: She is oriented to person, place, and time. She appears well-developed and well-nourished. No distress.  HENT:  Head: Normocephalic and atraumatic.  Right Ear: External ear normal.  Left Ear: External ear normal.  Nose: Nose normal.  Mouth/Throat: Oropharynx is clear and moist.  Neck: Normal range of motion. Neck supple. Thyromegaly (nodules present. Under care ofENT, Te[place]. Has FNA tomorrow) present.  Cardiovascular: Normal rate, regular rhythm and normal heart sounds.  No murmur heard. Pulmonary/Chest: Effort normal and breath sounds normal. No respiratory distress. She has no wheezes. She has no rales.  Abdominal: Soft. Bowel sounds are normal. She exhibits no distension. There is no tenderness.  Lymphadenopathy:    She has no cervical adenopathy.  Neurological: She is alert and oriented to person, place, and time. She has normal reflexes.  Skin: Skin is warm and dry.  Psychiatric: She has a normal mood and affect. Her behavior is normal. Judgment and thought content normal.      Assessment & Plan:   Tyrah was seen today for new patient (initial visit).  Diagnoses and all orders for this visit:  Current moderate episode of major depressive disorder without prior episode ([place]) -     CBC with Differential/Platelet -     CMP14+EGFR  Blindness of both eyes -     CBC with Differential/Platelet  Thyromegaly -     CBC with Differential/Platelet -     CMP14+EGFR -     Ambulatory referral to Pain Clinic  Rheumatoid arthritis involving multiple sites, unspecified rheumatoid factor presence (HCC) -     hydroxychloroquine (PLAQUENIL) 200 MG tablet; Take 1 tablet (200 mg total) by mouth 2 (two) times daily. -     gabapentin (NEURONTIN) 300 MG capsule; Take 1 capsule (300 mg total) by mouth at bedtime. -     CBC with Differential/Platelet -     ZJQ73+ALPF  Uncomplicated opioid dependence  ([place]) -     Ambulatory referral to Pain Clinic  Essential hypertension -     metoprolol succinate (TOPROL-XL) 100 MG 24 hr tablet; Take 1 tablet (100 mg total) by mouth daily. -     Discontinue: hydrochlorothiazide (HYDRODIURIL) 25 MG tablet; Take 1 tablet (25 mg total) by mouth daily. -     CBC with Differential/Platelet -     CMP14+EGFR -     Lipid panel  Gastroesophageal reflux disease with esophagitis -     Discontinue: DEXILANT 30 MG capsule; Take 1 capsule (30 mg total) by mouth daily. -     CBC with Differential/Platelet -     CMP14+EGFR -     Ambulatory referral to Gastroenterology  Irritable bowel syndrome with diarrhea -     CBC with Differential/Platelet -     CMP14+EGFR -     Ambulatory referral to Gastroenterology  Abdominal aortic aneurysm (AAA) without rupture ([place]) -     CBC with Differential/Platelet -     US ABDOMINAL AORTA SCREENING AAA; Future  Mixed hyperlipidemia -     CBC with Differential/Platelet -     Lipid panel  Screening for AAA (abdominal aortic aneurysm) -     US ABDOMINAL AORTA SCREENING AAA; Future  Other generalized epilepsy, not intractable, without status epilepticus ([place]) -     CARBATROL 200 MG 12 hr capsule; Take  1 capsule (200 mg total) by mouth 2 (two) times daily.  Orthostasis -     CBC with Differential/Platelet -     CMP14+EGFR  Other orders -     dexlansoprazole (DEXILANT) 60 MG capsule; Take 1 capsule (60 mg total) by mouth daily. On an empty stomach, then do not eat for 1 hour       I have discontinued [place] Slider. [place]'s DEXILANT, hydrochlorothiazide, ranitidine, sucralfate, hydrochlorothiazide, and DEXILANT. I have also changed her metoprolol succinate, hydroxychloroquine, gabapentin, and CARBATROL. Additionally, I am having her start on dexlansoprazole. Lastly, I am having her maintain her MAXALT, diphenhydrAMINE, HYDROcodone-acetaminophen, prednisoLONE acetate, ALPRAZolam, and ECHINACEA/GOLDENSEAL IMMUNE.  Allergies as  of 11/16/2017      Reactions   Bacitracin Rash   Neomycin Rash   Sulfa Antibiotics Swelling, Rash   Sulfamethoxazole Other (See Comments), Swelling   Prozac [fluoxetine Hcl]    Severe headache      Medication List        Accurate as of 11/16/17 11:59 PM. Always use your most recent med list.          ALPRAZolam 1 MG tablet Commonly known as:  XANAX Take 1 tablet (1 mg total) by mouth at bedtime as needed for anxiety.   CARBATROL 200 MG 12 hr capsule Generic drug:  carbamazepine Take 1 capsule (200 mg total) by mouth 2 (two) times daily.   dexlansoprazole 60 MG capsule Commonly known as:  DEXILANT Take 1 capsule (60 mg total) by mouth daily. On an empty stomach, then do not eat for 1 hour   diphenhydrAMINE 25 MG tablet Commonly known as:  BENADRYL Take 25 mg by mouth every 6 (six) hours as needed.   ECHINACEA/GOLDENSEAL IMMUNE Caps Take 1 capsule by mouth daily.   gabapentin 300 MG capsule Commonly known as:  NEURONTIN Take 1 capsule (300 mg total) by mouth at bedtime.   HYDROcodone-acetaminophen 5-325 MG tablet Commonly known as:  NORCO/VICODIN Take 1 tablet by mouth every 6 (six) hours as needed for moderate pain (back pain).   hydroxychloroquine 200 MG tablet Commonly known as:  PLAQUENIL Take 1 tablet (200 mg total) by mouth 2 (two) times daily.   MAXALT 10 MG tablet Generic drug:  rizatriptan Take 10 mg by mouth as needed.   metoprolol succinate 100 MG 24 hr tablet Commonly known as:  TOPROL-XL Take 1 tablet (100 mg total) by mouth daily.   prednisoLONE acetate 1 % ophthalmic suspension Commonly known as:  PRED FORTE INSTILL ONE DROP IN EACH EYE TWICE DAILY.      More than one hour ws spent with the patient. More than 1/2 was in consultation regarding plan of action for each of her conditions.  In addition to the above, pt. Will be seen by neurology, Dr. Jannifer Franklin for her seizures. She is an established pt. With him. She will not be prescribed opiates  in this office. Her HCTZ hhas been DCed in order to control orthostasis.  Follow-up: Return in about 3 months (around 02/16/2018).  Claretta Fraise, M.D.

## 2017-11-17 ENCOUNTER — Ambulatory Visit (HOSPITAL_COMMUNITY)
Admission: RE | Admit: 2017-11-17 | Discharge: 2017-11-17 | Disposition: A | Discharge status: Home / Self Care | Payer: Medicare Other | Source: Ambulatory Visit | Attending: Otolaryngology | Admitting: Otolaryngology

## 2017-11-17 ENCOUNTER — Encounter (HOSPITAL_COMMUNITY): Payer: Self-pay

## 2017-11-17 DIAGNOSIS — E049 Nontoxic goiter, unspecified: Secondary | ICD-10-CM | POA: Diagnosis not present

## 2017-11-17 DIAGNOSIS — E041 Nontoxic single thyroid nodule: Secondary | ICD-10-CM | POA: Diagnosis not present

## 2017-11-17 MED ORDER — LIDOCAINE HCL (PF) 2 % IJ SOLN
INTRAMUSCULAR | Status: AC
  Filled 2017-11-17: qty 10

## 2017-11-17 NOTE — Procedures (Signed)
PreOperative Dx: LEFT thyroid nodule Postoperative Dx: LEFT thyroid nodule Procedure:   US guided FNA of LEFt thyroid nodule Radiologist:  Thornton Papas Anesthesia:  3 ml of 2% lidocaine Specimen:  FNA x 5 (3 cytology, 2 Afirma) EBL:   < 1 ml Complications: None

## 2017-11-18 ENCOUNTER — Other Ambulatory Visit: Payer: Medicare Other

## 2017-11-18 DIAGNOSIS — R490 Dysphonia: Secondary | ICD-10-CM

## 2017-11-18 DIAGNOSIS — Z0000 Encounter for general adult medical examination without abnormal findings: Secondary | ICD-10-CM

## 2017-11-18 DIAGNOSIS — J385 Laryngeal spasm: Secondary | ICD-10-CM

## 2017-11-18 DIAGNOSIS — I10 Essential (primary) hypertension: Secondary | ICD-10-CM

## 2017-11-18 DIAGNOSIS — F321 Major depressive disorder, single episode, moderate: Secondary | ICD-10-CM

## 2017-11-18 LAB — CMP14+EGFR
ALBUMIN: 4.3 g/dL (ref 3.5–4.8)
ALT: 19 IU/L (ref 0–32)
AST: 17 IU/L (ref 0–40)
Albumin/Globulin Ratio: 2 (ref 1.2–2.2)
Alkaline Phosphatase: 96 IU/L (ref 39–117)
BILIRUBIN TOTAL: 0.2 mg/dL (ref 0.0–1.2)
BUN/Creatinine Ratio: 17 (ref 12–28)
BUN: 17 mg/dL (ref 8–27)
CALCIUM: 9.6 mg/dL (ref 8.7–10.3)
CHLORIDE: 103 mmol/L (ref 96–106)
CO2: 26 mmol/L (ref 20–29)
Creatinine, Ser: 1.01 mg/dL — ABNORMAL HIGH (ref 0.57–1.00)
GFR calc Af Amer: 65 mL/min/{1.73_m2} (ref >59)
GFR, EST NON AFRICAN AMERICAN: 57 mL/min/{1.73_m2} — AB (ref >59)
GLUCOSE: 89 mg/dL (ref 65–99)
Globulin, Total: 2.1 g/dL (ref 1.5–4.5)
POTASSIUM: 4.5 mmol/L (ref 3.5–5.2)
Sodium: 143 mmol/L (ref 134–144)
TOTAL PROTEIN: 6.4 g/dL (ref 6.0–8.5)

## 2017-11-18 LAB — CBC WITH DIFFERENTIAL/PLATELET
BASOS ABS: 0.1 10*3/uL (ref 0.0–0.2)
BASOS: 1 %
EOS (ABSOLUTE): 0.1 10*3/uL (ref 0.0–0.4)
Eos: 1 %
HEMOGLOBIN: 12.4 g/dL (ref 11.1–15.9)
Hematocrit: 38.3 % (ref 34.0–46.6)
IMMATURE GRANS (ABS): 0 10*3/uL (ref 0.0–0.1)
Immature Granulocytes: 0 %
LYMPHS: 27 %
Lymphocytes Absolute: 2 10*3/uL (ref 0.7–3.1)
MCH: 28.1 pg (ref 26.6–33.0)
MCHC: 32.4 g/dL (ref 31.5–35.7)
MCV: 87 fL (ref 79–97)
MONOCYTES: 8 %
Monocytes Absolute: 0.6 10*3/uL (ref 0.1–0.9)
NEUTROS ABS: 4.7 10*3/uL (ref 1.4–7.0)
Neutrophils: 63 %
Platelets: 280 10*3/uL (ref 150–450)
RBC: 4.42 x10E6/uL (ref 3.77–5.28)
RDW: 12.5 % (ref 12.3–15.4)
WBC: 7.4 10*3/uL (ref 3.4–10.8)

## 2017-11-18 LAB — LIPID PANEL
CHOL/HDL RATIO: 4 ratio (ref 0.0–4.4)
Cholesterol, Total: 203 mg/dL — ABNORMAL HIGH (ref 100–199)
HDL: 51 mg/dL (ref >39)
LDL Calculated: 107 mg/dL — ABNORMAL HIGH (ref 0–99)
TRIGLYCERIDES: 227 mg/dL — AB (ref 0–149)
VLDL Cholesterol Cal: 45 mg/dL — ABNORMAL HIGH (ref 5–40)

## 2017-11-22 ENCOUNTER — Encounter: Payer: Self-pay | Admitting: Family Medicine

## 2017-11-22 DIAGNOSIS — H543 Unqualified visual loss, both eyes: Secondary | ICD-10-CM | POA: Insufficient documentation

## 2017-11-22 MED ORDER — DEXLANSOPRAZOLE 60 MG PO CPDR: 60.0000 mg | DELAYED_RELEASE_CAPSULE | Freq: Every day | ORAL | 5 refills | Status: DC

## 2017-11-22 NOTE — Progress Notes (Signed)
Hello Niasia,  Your lab result is normal.Some minor variations that are not significant are commonly marked abnormal, but do not represent any medical problem for you.  Best regards, Sakoya Win, M.D.

## 2017-11-25 ENCOUNTER — Encounter: Payer: Self-pay | Admitting: Gastroenterology

## 2017-11-26 ENCOUNTER — Ambulatory Visit (HOSPITAL_COMMUNITY)
Admission: RE | Admit: 2017-11-26 | Discharge: 2017-11-26 | Disposition: A | Discharge status: Home / Self Care | Payer: Medicare Other | Source: Ambulatory Visit | Attending: Family Medicine | Admitting: Family Medicine

## 2017-11-26 DIAGNOSIS — Z136 Encounter for screening for cardiovascular disorders: Secondary | ICD-10-CM | POA: Diagnosis not present

## 2017-11-26 DIAGNOSIS — Z8489 Family history of other specified conditions: Secondary | ICD-10-CM | POA: Diagnosis not present

## 2017-11-26 DIAGNOSIS — I714 Abdominal aortic aneurysm, without rupture, unspecified: Secondary | ICD-10-CM

## 2018-01-20 ENCOUNTER — Other Ambulatory Visit (HOSPITAL_COMMUNITY): Payer: Self-pay | Admitting: Psychiatry

## 2018-02-03 ENCOUNTER — Encounter (INDEPENDENT_AMBULATORY_CARE_PROVIDER_SITE_OTHER): Payer: Self-pay | Admitting: Orthopaedic Surgery

## 2018-02-03 ENCOUNTER — Ambulatory Visit (INDEPENDENT_AMBULATORY_CARE_PROVIDER_SITE_OTHER): Payer: Self-pay

## 2018-02-03 ENCOUNTER — Ambulatory Visit (INDEPENDENT_AMBULATORY_CARE_PROVIDER_SITE_OTHER): Payer: Medicare Other | Admitting: Orthopaedic Surgery

## 2018-02-03 VITALS — BP 133/89 | HR 71 | Ht 66.0 in | Wt 177.0 lb

## 2018-02-03 DIAGNOSIS — M25561 Pain in right knee: Secondary | ICD-10-CM | POA: Diagnosis not present

## 2018-02-03 MED ORDER — METHYLPREDNISOLONE ACETATE 40 MG/ML IJ SUSP
40.0000 mg | INTRAMUSCULAR | Status: AC | PRN
  Administered 2018-02-03: 40 mg via INTRA_ARTICULAR

## 2018-02-03 MED ORDER — LIDOCAINE HCL 1 % IJ SOLN
0.5000 mL | INTRAMUSCULAR | Status: AC | PRN
  Administered 2018-02-03: .5 mL

## 2018-02-03 MED ORDER — BUPIVACAINE HCL 0.25 % IJ SOLN
4.0000 mL | INTRAMUSCULAR | Status: AC | PRN
  Administered 2018-02-03: 4 mL via INTRA_ARTICULAR

## 2018-02-03 NOTE — Progress Notes (Signed)
Office Visit Note   Patient: Erica Bennett           Date of Birth: 01/14/1946           MRN: [id number] Visit Date: 02/03/2018              Requested by: Claretta Fraise, MD [place], [place] [id number] PCP: Claretta Fraise, MD   Assessment & Plan: Visit Diagnoses:  1. Acute pain of right knee     Plan: Injection performed which she tolerated well.  Follow-up PRN.  Follow-Up Instructions: No follow-ups on file.   Orders:  Orders Placed This Encounter  Procedures  . XR Knee 1-2 Views Right   No orders of the defined types were placed in this encounter.     Procedures: Large Joint Inj: R knee on 02/03/2018 2:25 PM Indications: pain and joint swelling Details: 22 G 1.5 in needle, anterolateral approach  Arthrogram: No  Medications: 40 mg methylPREDNISolone acetate 40 MG/ML; 0.5 mL lidocaine 1 %; 4 mL bupivacaine 0.25 % Outcome: tolerated well, no immediate complications Procedure, treatment alternatives, risks and benefits explained, specific risks discussed. Consent was given by the patient. Immediately prior to procedure a time out was called to verify the correct patient, procedure, equipment, support staff and site/side marked as required. Patient was prepped and draped in the usual sterile fashion.       Clinical Data: No additional findings.   Subjective: Chief Complaint  Patient presents with  . Right Knee - Pain    HPI 73-year-old female returns she is had problems with her right knee with pain significant swelling for the last few weeks.  She states is down some today.  More pain lateral than medial.  She is had significant problems going up a ramp.  She avoids stairs.  She denies any true locking she is used ibuprofen in the past and states sometimes gives her headaches she is used some heat.  Past history of previous knee injection about 15 years ago.  No history of significant knee injury.  Negative for gout.  Review of Systems is for allergic  spasms, depression dysphonia blindness of both eyes past history of trochanteric bursitis otherwise 14 point review of systems negative.   Objective: Vital Signs: BP 133/89   Pulse 71   Ht 5\' 6"  (1.676 m)   Wt 177 lb (80.3 kg)   BMI 28.57 kg/m   Physical Exam Constitutional:      Appearance: She is well-developed.  HENT:     Head: Normocephalic.     Right Ear: External ear normal.     Left Ear: External ear normal.  Eyes:     Pupils: Pupils are equal, round, and reactive to light.  Neck:     Thyroid: No thyromegaly.     Trachea: No tracheal deviation.  Cardiovascular:     Rate and Rhythm: Normal rate.  Pulmonary:     Effort: Pulmonary effort is normal.  Abdominal:     Palpations: Abdomen is soft.  Skin:    General: Skin is warm and dry.  Neurological:     Mental Status: She is alert and oriented to person, place, and time.  Psychiatric:        Behavior: Behavior normal.     Ortho Exam negative straight leg raising normal hip range of motion.  She has tenderness more medially on the right knee than the left with crepitus with knee extension.  Cruciate ligament exam is normal.  Specialty Comments:  No specialty comments available.  Imaging: No results found.   PMFS History: Patient Active Problem List   Diagnosis Date Noted  . Blindness of both eyes 11/22/2017  . Dysphonia 06/18/2016  . Laryngospasms 06/18/2016  . Depression 05/30/2014   Past Medical History:  Diagnosis Date  . Blindness of both eyes   . Elevated cholesterol   . GERD (gastroesophageal reflux disease)   . Headache   . Hypertension   . Seizures ([place])   . Skin cancer     Family History  Problem Relation Age of Onset  . Depression Paternal Aunt   . Alcohol abuse Paternal Uncle   . Drug abuse Paternal Uncle   . Depression Cousin   . Depression Maternal Aunt     Past Surgical History:  Procedure Laterality Date  . ABDOMINAL HYSTERECTOMY    . Bone spur removed from shoulder    .  CHOLECYSTECTOMY    . ENUCLEATION Bilateral   . TONSILLECTOMY     Social History   Occupational History  . Occupation: Retired  Tobacco Use  . Smoking status: Former Research scientist (life sciences)  . Smokeless tobacco: Never Used  . Tobacco comment: Quit 23 years ago  Substance and Sexual Activity  . Alcohol use: No    Alcohol/week: 0.0 standard drinks  . Drug use: No  . Sexual activity: Yes    Birth control/protection: Surgical

## 2018-02-10 DIAGNOSIS — R768 Other specified abnormal immunological findings in serum: Secondary | ICD-10-CM | POA: Diagnosis not present

## 2018-02-10 DIAGNOSIS — M359 Systemic involvement of connective tissue, unspecified: Secondary | ICD-10-CM | POA: Diagnosis not present

## 2018-02-10 DIAGNOSIS — N3000 Acute cystitis without hematuria: Secondary | ICD-10-CM | POA: Diagnosis not present

## 2018-02-10 DIAGNOSIS — R05 Cough: Secondary | ICD-10-CM | POA: Diagnosis not present

## 2018-02-10 DIAGNOSIS — E663 Overweight: Secondary | ICD-10-CM | POA: Diagnosis not present

## 2018-02-10 DIAGNOSIS — R21 Rash and other nonspecific skin eruption: Secondary | ICD-10-CM | POA: Diagnosis not present

## 2018-02-10 DIAGNOSIS — Z6829 Body mass index (BMI) 29.0-29.9, adult: Secondary | ICD-10-CM | POA: Diagnosis not present

## 2018-02-10 DIAGNOSIS — R300 Dysuria: Secondary | ICD-10-CM | POA: Diagnosis not present

## 2018-02-10 DIAGNOSIS — R29818 Other symptoms and signs involving the nervous system: Secondary | ICD-10-CM | POA: Diagnosis not present

## 2018-02-10 DIAGNOSIS — M25561 Pain in right knee: Secondary | ICD-10-CM | POA: Diagnosis not present

## 2018-02-10 DIAGNOSIS — M2550 Pain in unspecified joint: Secondary | ICD-10-CM | POA: Diagnosis not present

## 2018-02-16 ENCOUNTER — Encounter: Payer: Self-pay | Admitting: Family Medicine

## 2018-02-16 ENCOUNTER — Other Ambulatory Visit (HOSPITAL_COMMUNITY): Payer: Self-pay | Admitting: Psychiatry

## 2018-02-16 ENCOUNTER — Other Ambulatory Visit: Payer: Self-pay | Admitting: Family Medicine

## 2018-02-16 ENCOUNTER — Ambulatory Visit (INDEPENDENT_AMBULATORY_CARE_PROVIDER_SITE_OTHER): Payer: Medicare Other | Admitting: Family Medicine

## 2018-02-16 VITALS — BP 119/66 | HR 65 | Temp 96.8°F | Ht 66.0 in | Wt 184.4 lb

## 2018-02-16 DIAGNOSIS — F1120 Opioid dependence, uncomplicated: Secondary | ICD-10-CM

## 2018-02-16 DIAGNOSIS — E782 Mixed hyperlipidemia: Secondary | ICD-10-CM

## 2018-02-16 DIAGNOSIS — I10 Essential (primary) hypertension: Secondary | ICD-10-CM

## 2018-02-16 DIAGNOSIS — G894 Chronic pain syndrome: Secondary | ICD-10-CM | POA: Diagnosis not present

## 2018-02-16 NOTE — Progress Notes (Signed)
Subjective:  Patient ID: Erica Bennett,  female    DOB: 19-Jun-1945  Age: 73 y.o.    CC: Medical Management of Chronic Issues (pt here today for routine follow up of her chronic medical conditions and also in quite a bit of pain)   HPI Erica Bennett presents for  follow-up of hypertension. Patient has no history of headache chest pain or shortness of breath or recent cough. Patient also denies symptoms of TIA such as numbness weakness lateralizing. Patient denies side effects from medication. States taking it regularly.  Patient also  in for follow-up of elevated cholesterol. Doing well without complaints on current medication. Denies side effects  including myalgia and arthralgia and nausea. Also in today for liver function testing. Currently no chest pain, shortness of breath or other cardiovascular related symptoms noted.  Pt. Reports multiple joint pains including lumbar back and hips. No relief with current dose of gabapentin. History Erica Bennett has a past medical history of Blindness of both eyes, Elevated cholesterol, GERD (gastroesophageal reflux disease), Headache, Hypertension, Seizures ([place]), and Skin cancer.   She has a past surgical history that includes Enucleation (Bilateral); Abdominal hysterectomy; Tonsillectomy; Cholecystectomy; and Bone spur removed from shoulder.   Her family history includes Alcohol abuse in her paternal uncle; Depression in her cousin, maternal aunt, and paternal aunt; Drug abuse in her paternal uncle.She reports that she has quit smoking. She has never used smokeless tobacco. She reports that she does not drink alcohol or use drugs.  Current Outpatient Medications on File Prior to Visit  Medication Sig Dispense Refill  . CARBATROL 200 MG 12 hr capsule Take 1 capsule (200 mg total) by mouth 2 (two) times daily. 180 capsule 1  . dexlansoprazole (DEXILANT) 60 MG capsule Take 1 capsule (60 mg total) by mouth daily. On an empty stomach, then do not eat  for 1 hour 30 capsule 5  . hydroxychloroquine (PLAQUENIL) 200 MG tablet Take 1 tablet (200 mg total) by mouth 2 (two) times daily. 180 tablet 1  . MAXALT 10 MG tablet Take 10 mg by mouth as needed.   5  . metoprolol succinate (TOPROL-XL) 100 MG 24 hr tablet Take 1 tablet (100 mg total) by mouth daily. 90 tablet 1  . Nutritional Supplements (ECHINACEA/GOLDENSEAL IMMUNE) CAPS Take 1 capsule by mouth daily.    . prednisoLONE acetate (PRED FORTE) 1 % ophthalmic suspension INSTILL ONE DROP IN [facility] EYE TWICE DAILY.     No current facility-administered medications on file prior to visit.     ROS Review of Systems  Constitutional: Negative.   HENT: Negative for congestion.   Eyes: Negative for visual disturbance.  Respiratory: Negative for shortness of breath.   Cardiovascular: Negative for chest pain.  Gastrointestinal: Negative for abdominal pain, constipation, diarrhea, nausea and vomiting.  Genitourinary: Negative for difficulty urinating.  Musculoskeletal: Positive for arthralgias, back pain and myalgias.  Neurological: Negative for headaches.  Psychiatric/Behavioral: Negative for sleep disturbance.    Objective:  BP 119/66   Pulse 65   Temp (!) 96.8 F (36 C) (Oral)   Ht 5\' 6"  (1.676 m)   Wt 184 lb 6 oz (83.6 kg)   BMI 29.76 kg/m   BP Readings from Last 3 Encounters:  02/16/18 119/66  02/03/18 133/89  11/17/17 138/67    Wt Readings from Last 3 Encounters:  02/16/18 184 lb 6 oz (83.6 kg)  02/03/18 177 lb (80.3 kg)  11/16/17 184 lb (83.5 kg)     Physical Exam  Constitutional:      General: She is not in acute distress.    Appearance: She is well-developed.  HENT:     Head: Normocephalic and atraumatic.     Right Ear: External ear normal.     Left Ear: External ear normal.     Nose: Nose normal.  Eyes:     Conjunctiva/sclera: Conjunctivae normal.     Pupils: Pupils are equal, round, and reactive to light.  Neck:     Musculoskeletal: Normal range of motion and  neck supple.     Thyroid: No thyromegaly.  Cardiovascular:     Rate and Rhythm: Normal rate and regular rhythm.     Heart sounds: Normal heart sounds. No murmur.  Pulmonary:     Effort: Pulmonary effort is normal. No respiratory distress.     Breath sounds: Normal breath sounds. No wheezing or rales.  Abdominal:     General: Bowel sounds are normal. There is no distension.     Palpations: Abdomen is soft.     Tenderness: There is no abdominal tenderness.  Musculoskeletal:        General: Tenderness (lower back and hipsRecent right knee replacement) present.  Lymphadenopathy:     Cervical: No cervical adenopathy.  Skin:    General: Skin is warm and dry.  Neurological:     Mental Status: She is alert and oriented to person, place, and time.     Deep Tendon Reflexes: Reflexes are normal and symmetric.  Psychiatric:        Behavior: Behavior normal.        Thought Content: Thought content normal.        Judgment: Judgment normal.         Assessment & Plan:   Erica Bennett was seen today for medical management of chronic issues.  Diagnoses and all orders for this visit:  Essential hypertension  Mixed hyperlipidemia  Uncomplicated opioid dependence (HCC)  Chronic pain syndrome  Other orders -     ALPRAZolam (XANAX) 1 MG tablet; TAKE ONE TABLET BY MOUTH AT BEDTIME AS NEEDED FOR ANXIETY -     gabapentin (NEURONTIN) 300 MG capsule; 1 at bedtime for1 week then 2  The next  week then 3 the next week then 4 daily.   I have discontinued Sheffield Slider. Zanetti's diphenhydrAMINE, HYDROcodone-acetaminophen, and gabapentin. I am also having her start on gabapentin. Additionally, I am having her maintain her MAXALT, prednisoLONE acetate, ECHINACEA/GOLDENSEAL IMMUNE, metoprolol succinate, hydroxychloroquine, CARBATROL, dexlansoprazole, and ALPRAZolam.  Meds ordered this encounter  Medications  . ALPRAZolam (XANAX) 1 MG tablet    Sig: TAKE ONE TABLET BY MOUTH AT BEDTIME AS NEEDED FOR ANXIETY     Dispense:  30 tablet    Refill:  0  . gabapentin (NEURONTIN) 300 MG capsule    Sig: 1 at bedtime for1 week then 2  The next  week then 3 the next week then 4 daily.    Dispense:  120 capsule    Refill:  0     Follow-up: Return in about 3 months (around 05/18/2018).  Claretta Fraise, M.D.

## 2018-02-20 ENCOUNTER — Encounter: Payer: Self-pay | Admitting: Family Medicine

## 2018-02-20 MED ORDER — GABAPENTIN 300 MG PO CAPS: ORAL_CAPSULE | ORAL | 0 refills | Status: DC

## 2018-02-20 MED ORDER — ALPRAZOLAM 1 MG PO TABS: ORAL_TABLET | ORAL | 0 refills | Status: DC

## 2018-02-23 ENCOUNTER — Telehealth: Payer: Self-pay

## 2018-02-23 ENCOUNTER — Encounter: Payer: Self-pay | Admitting: Gastroenterology

## 2018-02-23 ENCOUNTER — Encounter: Payer: Self-pay | Admitting: Nurse Practitioner

## 2018-02-23 ENCOUNTER — Ambulatory Visit (INDEPENDENT_AMBULATORY_CARE_PROVIDER_SITE_OTHER): Payer: Medicare Other | Admitting: Nurse Practitioner

## 2018-02-23 ENCOUNTER — Encounter: Payer: Self-pay | Admitting: *Deleted

## 2018-02-23 ENCOUNTER — Other Ambulatory Visit: Payer: Self-pay | Admitting: Family Medicine

## 2018-02-23 ENCOUNTER — Other Ambulatory Visit: Payer: Self-pay | Admitting: *Deleted

## 2018-02-23 DIAGNOSIS — K219 Gastro-esophageal reflux disease without esophagitis: Secondary | ICD-10-CM

## 2018-02-23 DIAGNOSIS — G40409 Other generalized epilepsy and epileptic syndromes, not intractable, without status epilepticus: Secondary | ICD-10-CM

## 2018-02-23 DIAGNOSIS — Z800 Family history of malignant neoplasm of digestive organs: Secondary | ICD-10-CM | POA: Insufficient documentation

## 2018-02-23 DIAGNOSIS — R197 Diarrhea, unspecified: Secondary | ICD-10-CM | POA: Insufficient documentation

## 2018-02-23 MED ORDER — NA SULFATE-K SULFATE-MG SULF 17.5-3.13-1.6 GM/177ML PO SOLN: 1.0000 | ORAL | 0 refills | Status: DC

## 2018-02-23 NOTE — Patient Instructions (Addendum)
Your health issues we discussed today were:   Heartburn (GERD): 1. Continue taking Dexilant 60 mg daily 2. I will ask our nurse if there are any prescription saving cards that may help, although I cannot guarantee it  Diarrhea: 1. Continue taking your probiotic 2. I have sent in Bentyl 10 mg to your pharmacy.  You can take this up to 3 times a day, as needed for diarrhea symptoms 3. Call us if you have any worsening or severe symptoms  Family history of colon cancer: 1. We will schedule your colonoscopy for you. 2. Further recommendations will be made after your colonoscopy  Overall I recommend:  1. Follow-up in 4 months 2. Call us if you have any questions or concerns  At [facility] we value your feedback. You may receive a survey about your visit today. Please share your experience as we strive to create trusting relationships with our patients to provide genuine, compassionate, quality care.  We appreciate your understanding and patience as we review any laboratory studies, imaging, and other diagnostic tests that are ordered as we care for you. Our office policy is 5 business days for review of these results, and any emergent or urgent results are addressed in a timely manner for your best interest. If you do not hear from our office in 1 week, please contact us.   We also encourage the use of MyChart, which contains your medical information for your review as well. If you are not enrolled in this feature, an access code is on this after visit summary for your convenience. Thank you for allowing [place] to be involved in your care.  It was great to see you today!  I hope you have a great day!!

## 2018-02-23 NOTE — Progress Notes (Signed)
Primary Care Physician:  Claretta Fraise, MD Primary Gastroenterologist:  Dr. Oneida Alar  Chief Complaint  Patient presents with  . Gastroesophageal Reflux  . Diarrhea    HPI:   Erica Bennett is a 73 y.o. female who presents on referral from primary care for reflux and IBS-D.  Reviewed information provided with the referral including office visit dated 11/16/2017 for multiple issues.  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  Has bilateral eye prosthesis.  At that time the patient noted problems with irritable bowel with mixed constipation and diarrhea, but mostly diarrhea.  Has chronic GERD including substernal burning for which she takes Dexilant 30 mg daily, although she notes control is not very good and supplements with Pepcid.  It appears her Dexilant was changed to 60 mg daily and labs were checked.  She was referred to GI.  Previous colonoscopy completed 01/03/2018 at [facility] and report is available and scanned into our system.  EGD was completed due to "heartburn".  Findings include normal esophagus status post multiple biopsies, linear abnormal mucosa in the gastric antrum which was erythematous.  Duodenum normal.  Surgical pathology report was available.  Esophageal biopsy found benign-appearing squamous epithelial tissue consistent with esophageal origin.  Recommended continue PPI.  Labs were drawn on 11/18/2017 which showed normal CBC, essentially normal CMP other than mild bump in creatinine to 1.01.  Colonoscopy up-to-date 2014 which found a single sessile polyp found to be benign colonic mucosa on pathology.  Today she states she's doing well overall. Since starting Dexilant 60 mg her GERD has resolved. It is quite expensive for her (around $200 copay). Has tried multiple other options with not as good effect. She is having intermittent diarrhea which has been chronic for her for over 15 years. Frequency  depends on what she eats; as often as multiple times a day for a couple days (eggs, fiber, gravy, etc). If she avoids those she will have soft stools but no diarrhea; but almost ever has a formed stool. Has started probiotics and she thinks this may be trying to help. Has tried taking Imodium, which is not very effective. Denies hematochezia, unintentional weight loss.  She notes the colonoscopy prior to her most recent one she had several polyps. Colon CA runs heavily in her mom's side of the family.  Past Medical History:  Diagnosis Date  . Blindness of both eyes   . Elevated cholesterol   . GERD (gastroesophageal reflux disease)   . Headache   . Hypertension   . Seizures ([place])   . Skin cancer     Past Surgical History:  Procedure Laterality Date  . ABDOMINAL HYSTERECTOMY    . Bone spur removed from shoulder    . CHOLECYSTECTOMY    . ENUCLEATION Bilateral   . TONSILLECTOMY      Current Outpatient Medications  Medication Sig Dispense Refill  . ALPRAZolam (XANAX) 1 MG tablet TAKE ONE TABLET BY MOUTH AT BEDTIME AS NEEDED FOR ANXIETY 30 tablet 0  . CARBATROL 200 MG 12 hr capsule Take 1 capsule (200 mg total) by mouth 2 (two) times daily. 180 capsule 1  . dexlansoprazole (DEXILANT) 60 MG capsule Take 1 capsule (60 mg total) by mouth daily. On an empty stomach, then do not eat for 1 hour 30 capsule 5  . gabapentin (NEURONTIN) 300 MG capsule 1 at bedtime for1 week then 2  The next  week then 3 the next  week then 4 daily. 120 capsule 0  . hydroxychloroquine (PLAQUENIL) 200 MG tablet Take 1 tablet (200 mg total) by mouth 2 (two) times daily. 180 tablet 1  . MAXALT 10 MG tablet Take 10 mg by mouth as needed.   5  . metoprolol succinate (TOPROL-XL) 100 MG 24 hr tablet Take 1 tablet (100 mg total) by mouth daily. 90 tablet 1  . Nutritional Supplements (ECHINACEA/GOLDENSEAL IMMUNE) CAPS Take 1 capsule by mouth daily.    . prednisoLONE acetate (PRED FORTE) 1 % ophthalmic suspension INSTILL ONE  DROP IN [facility] EYE TWICE DAILY.    . Probiotic Product (PROBIOTIC DAILY PO) Take by mouth. Renew Life Ultimate Flora Probiotic 1 tablet (150 billion) live cultures     No current facility-administered medications for this visit.     Allergies as of 02/23/2018 - Review Complete 02/23/2018  Allergen Reaction Noted  . Bacitracin Rash   . Neomycin Rash   . Sulfa antibiotics Swelling and Rash 03/09/2011  . Sulfamethoxazole Other (See Comments) and Swelling 06/18/2016  . Prozac [fluoxetine hcl]  10/24/2015    Family History  Problem Relation Age of Onset  . Depression Paternal Aunt   . Alcohol abuse Paternal Uncle   . Drug abuse Paternal Uncle   . Depression Cousin   . Depression Maternal Aunt   . Colon cancer Mother   . Colon cancer Maternal Aunt   . Colon cancer Other     Social History   Socioeconomic History  . Marital status: Married    Spouse name: Not on file  . Number of children: 0  . Years of education: 42  . Highest education level: Not on file  Occupational History  . Occupation: Retired  Scientific laboratory technician  . Financial resource strain: Not on file  . Food insecurity:    Worry: Not on file    Inability: Not on file  . Transportation needs:    Medical: Not on file    Non-medical: Not on file  Tobacco Use  . Smoking status: Former Research scientist (life sciences)  . Smokeless tobacco: Never Used  . Tobacco comment: Quit 23 years ago  Substance and Sexual Activity  . Alcohol use: No    Alcohol/week: 0.0 standard drinks  . Drug use: No  . Sexual activity: Yes    Birth control/protection: Surgical  Lifestyle  . Physical activity:    Days per week: Not on file    Minutes per session: Not on file  . Stress: Not on file  Relationships  . Social connections:    Talks on phone: Not on file    Gets together: Not on file    Attends religious service: Not on file    Active member of club or organization: Not on file    Attends meetings of clubs or organizations: Not on file    Relationship  status: Not on file  . Intimate partner violence:    Fear of current or ex partner: Not on file    Emotionally abused: Not on file    Physically abused: Not on file    Forced sexual activity: Not on file  Other Topics Concern  . Not on file  Social History Narrative   POA-Dennis   Caffeine use: daily (1 soda per day, 1 coffee per day)   Left handed    Legally blind      Worked as LPN/RN for about 8 years    Review of Systems: Complete ROS negative except as per HPI.  Physical Exam: BP (!) 116/58   Pulse 61   Temp (!) 96.5 F (35.8 C) (Oral)   Ht 5\' 6"  (1.676 m)   Wt 186 lb 12.8 oz (84.7 kg)   BMI 30.15 kg/m  General:   Alert and oriented. Pleasant and cooperative. Well-nourished and well-developed.  Head:  Normocephalic and atraumatic. Eyes:  Blind.  Ears:  Normal auditory acuity. Cardiovascular:  S1, S2 present without murmurs appreciated. Extremities without clubbing or edema. Respiratory:  Clear to auscultation bilaterally. No wheezes, rales, or rhonchi. No distress.  Gastrointestinal:  +BS, soft, non-tender and non-distended. No HSM noted. No guarding or rebound. No masses appreciated.  Rectal:  Deferred  Musculoskalatal:  Symmetrical without gross deformities. Neurologic:  Alert and oriented x4;  grossly normal neurologically. Psych:  Alert and cooperative. Normal mood and affect. Heme/Lymph/Immune: No excessive bruising noted.    02/23/2018 3:23 PM   Disclaimer: This note was dictated with voice recognition software. Similar sounding words can inadvertently be transcribed and may not be corrected upon review.

## 2018-02-23 NOTE — Assessment & Plan Note (Signed)
The patient has a history of chronic GERD.  She is tried multiple PPIs.  The only thing that has been effective thus far is Dexilant 60 mg daily.  This is a bit expensive as a cost about $200 for her.  Recommend she continue taking Dexilant.  We can investigate if prescription savings card may be of benefit to her.  Follow-up in 4 months.

## 2018-02-23 NOTE — Telephone Encounter (Signed)
Verbal RX for Bentyl 10 mg #30 with 1 rf called into [facility]. Pt was already at the pharmacy waiting on her RX, ok per EG.

## 2018-02-23 NOTE — Progress Notes (Signed)
cc'ed to pcp °

## 2018-02-23 NOTE — Assessment & Plan Note (Signed)
The patient has intermittent diarrhea which she states is been ongoing for more than 15 years.  Some mild abdominal discomfort intermittently.  States she was diagnosed with IBS diarrhea type at some point.  Worse with certain foods, as per HPI.  She is tried Imodium which is not really helped much.  At this point she is on probiotics and this seems to be possibly helping.  Recommend she continue probiotics, I will send in Bentyl 10 mg to her pharmacy that she can use as needed.  Follow-up in 4 months.  Call with any worsening symptoms.

## 2018-02-23 NOTE — Assessment & Plan Note (Signed)
High risk for colon cancer due to family history and her primary relative (her mother).  Also, multiple members in her mom side of the family have also had colon cancer.  Her last colonoscopy was just over 5 years ago.  There was no recommended interval but given her high risk status she is due for 5-year repeat colonoscopy.  She has had multiple polyps in the past, her last colonoscopy in 2014 had a single sessile polyp.  At this point we will proceed with colonoscopy.  Further recommendations to follow.

## 2018-02-24 ENCOUNTER — Telehealth: Payer: Self-pay | Admitting: *Deleted

## 2018-02-24 NOTE — Telephone Encounter (Signed)
Pre-op scheduled for 05/18/2018 at 11:00am. Letter mailed. LMOVM.

## 2018-02-28 ENCOUNTER — Telehealth: Payer: Self-pay | Admitting: Family Medicine

## 2018-02-28 NOTE — Telephone Encounter (Signed)
Will work on this on Tuesday  NOthing from pharmacy

## 2018-03-01 ENCOUNTER — Encounter (HOSPITAL_COMMUNITY): Payer: Self-pay | Admitting: Psychiatry

## 2018-03-01 ENCOUNTER — Ambulatory Visit (INDEPENDENT_AMBULATORY_CARE_PROVIDER_SITE_OTHER): Payer: Medicare Other | Admitting: Psychiatry

## 2018-03-01 VITALS — BP 128/82 | HR 66 | Ht 66.0 in | Wt 184.0 lb

## 2018-03-01 DIAGNOSIS — F321 Major depressive disorder, single episode, moderate: Secondary | ICD-10-CM | POA: Diagnosis not present

## 2018-03-01 MED ORDER — AMITRIPTYLINE HCL 10 MG PO TABS: ORAL_TABLET | ORAL | 2 refills | Status: DC

## 2018-03-01 MED ORDER — ALPRAZOLAM 1 MG PO TABS: ORAL_TABLET | ORAL | 2 refills | Status: DC

## 2018-03-01 NOTE — Progress Notes (Signed)
[facility] MD/PA/NP OP Progress Note  03/01/2018 1:40 PM Erica Bennett  MRN:  [id number]  Chief Complaint:  Chief Complaint    Anxiety; Depression; Follow-up     HPI: this patient is a 73 year old married white female who lives with her husband in [place]. They've been married for 14years. She has no children. She is on disability for congenital blindness.  The patient was referred by Dr.Hosanji, her primary physician, for further assessment of depression anxiety.  The patient states that her mother contracted rubella when she was pregnant with her. 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 By her 11s she was totally blind. 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  She has learned Braille and gets help through the [employer]. She was very comfortable doing things for herself. Both her parents are deceased and she doesn't have any other family. 11 years ago she reconnected with a man that she grown up with all her life. He had a history of alcoholism but claimed he had been sober for 5 years. They got married initially it went okay but then he began drinking. He goes through alcoholic bouts that can last months to years and he usually drinks about a half gallon of vodka a day. He last went through treatment last June but around March of this year he started drinking again. She felt very taken advantage of that she can't see what he is doing but she has been finding the bottles in confronting him and he is been lying about it. This is made her very uncomfortable. He is her power of attorney and they share a bank account. They've been arguing a lot more and she is become increasingly anxious and somewhat depressed.  The patient did have some prior treatment in her 12s  when she went blind. She saw psychiatrist to help deal with the change and also took Paxil for a while. She's not had any treatment since. She's very active in Al-Anon and has several good friends from the program. She knows that she should detach from her alcoholic husband but it's difficult to do so because of her dependency on him.  The patient has always had difficulty sleeping because of the circadian problem with being blind. She has tried [place] and Ambien but they didn't help and the newer drugs for this are extremely expensive. She is on a low dose of Xanax which is helped a little bit but she only gets about 3-4 hours of sleep a night. She is tired through the day, her energy is low. She denies suicidal ideation. She enjoys doing things with friends but will often walk him around because of her husbands behavior. She herself does not drink or use drugs and she's never had psychotic symptoms  The patient and her husband return after 4 months.  She has not been doing well lately.  She has had a flareup of her autoimmune disorder.  She goes to rheumatologist and they have defined it as a lupus-like disorder.  She is on Plaquenil.  Her primary doctor, Dr. Livia Snellen, recently started her on gabapentin.  However she feels much more unsteady on it it is causing nausea and is worsening her sleep.  I suggested she taper off of it.  She is only  getting 2 to 3 hours of sleep.  We discussed the possibility of a circadian disorder given her blindness and I will look into getting a medication for this but we will need to get it approved.  She does appear to be more depressed, feels like a burden because she is so tired all the time and her joints ache and her husband is doing all the housework.  She denies suicidal ideation but just seems low.  I suggested we try amitriptyline at bedtime since all of the SSRIs have caused her to be nauseous.  Amitriptyline may be better for her sleep and body aches but we will start  at a low dosage. Visit Diagnosis:    ICD-10-CM   1. 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 Blindness of both eyes   . Elevated cholesterol   . GERD (gastroesophageal reflux disease)   . Headache   . Hypertension   . Seizures ([place])   . Skin cancer     Past Surgical History:  Procedure Laterality Date  . ABDOMINAL HYSTERECTOMY    . Bone spur removed from shoulder    . CHOLECYSTECTOMY    . ENUCLEATION Bilateral   . TONSILLECTOMY      Family Psychiatric History: See below  Family History:  Family History  Problem Relation Age of Onset  . Depression Paternal Aunt   . Alcohol abuse Paternal Uncle   . Drug abuse Paternal Uncle   . Depression Cousin   . Depression Maternal Aunt   . Colon cancer Mother   . Colon cancer Maternal Aunt   . Colon cancer Other     Social History:  Social History   Socioeconomic History  . Marital status: Married    Spouse name: Not on file  . Number of children: 0  . Years of education: 51  . Highest education level: Not on file  Occupational History  . Occupation: Retired  Scientific laboratory technician  . Financial resource strain: Not on file  . Food insecurity:    Worry: Not on file    Inability: Not on file  . Transportation needs:    Medical: Not on file    Non-medical: Not on file  Tobacco Use  . Smoking status: Former Research scientist (life sciences)  . Smokeless tobacco: Never Used  . Tobacco comment: Quit 23 years ago  Substance and Sexual Activity  . Alcohol use: No    Alcohol/week: 0.0 standard drinks  . Drug use: No  . Sexual activity: Yes    Birth control/protection: Surgical  Lifestyle  . Physical activity:    Days per week: Not on file    Minutes per session: Not on file  . Stress: Not on file  Relationships  . Social connections:    Talks on phone: Not on file    Gets together: Not  on file    Attends religious service: Not on file    Active member of club or organization: Not on file    Attends meetings of clubs or organizations: Not on file    Relationship status: Not on file  Other Topics Concern  . Not on file  Social History Narrative   POA-Dennis   Caffeine use: daily (1 soda per day, 1 coffee per day)   Left handed    Legally blind      Worked as  LPN/RN for about 8 years    Allergies:  Allergies  Allergen Reactions  . Bacitracin Rash  . Neomycin Rash  . Sulfa Antibiotics Swelling and Rash  . Sulfamethoxazole Other (See Comments) and Swelling  . Prozac [Fluoxetine Hcl]     Severe headache    Metabolic Disorder Labs: No results found for: HGBA1C, MPG No results found for: PROLACTIN Lab Results  Component Value Date   CHOL 203 (H) 11/18/2017   TRIG 227 (H) 11/18/2017   HDL 51 11/18/2017   CHOLHDL 4.0 11/18/2017   LDLCALC 107 (H) 11/18/2017   Lab Results  Component Value Date   TSH 1.56 10/26/2017    Therapeutic Level Labs: No results found for: LITHIUM No results found for: VALPROATE No components found for:  CBMZ  Current Medications: Current Outpatient Medications  Medication Sig Dispense Refill  . ALPRAZolam (XANAX) 1 MG tablet TAKE ONE TABLET BY MOUTH AT BEDTIME AS NEEDED FOR ANXIETY 30 tablet 2  . CARBATROL 200 MG 12 hr capsule TAKE ONE CAPSULE BY MOUTH TWICE DAILY. 180 capsule 1  . dexlansoprazole (DEXILANT) 60 MG capsule Take 1 capsule (60 mg total) by mouth daily. On an empty stomach, then do not eat for 1 hour 30 capsule 5  . hydroxychloroquine (PLAQUENIL) 200 MG tablet Take 1 tablet (200 mg total) by mouth 2 (two) times daily. 180 tablet 1  . MAXALT 10 MG tablet Take 10 mg by mouth as needed.   5  . metoprolol succinate (TOPROL-XL) 100 MG 24 hr tablet Take 1 tablet (100 mg total) by mouth daily. 90 tablet 1  . Na Sulfate-K Sulfate-Mg Sulf 17.5-3.13-1.6 GM/177ML SOLN Take 1 kit by mouth as directed. 1 Bottle 0  . Nutritional  Supplements (ECHINACEA/GOLDENSEAL IMMUNE) CAPS Take 1 capsule by mouth daily.    . prednisoLONE acetate (PRED FORTE) 1 % ophthalmic suspension INSTILL ONE DROP IN [facility] EYE TWICE DAILY.    . Probiotic Product (PROBIOTIC DAILY PO) Take by mouth. Renew Life Ultimate Flora Probiotic 1 tablet (150 billion) live cultures    . amitriptyline (ELAVIL) 10 MG tablet Take one at bedtime for one week, then increase to 2 at bedtime 30 tablet 2   No current facility-administered medications for this visit.      Musculoskeletal: Strength & Muscle Tone: decreased Gait & Station: unsteady Patient leans: N/A  Psychiatric Specialty Exam: Review of Systems  Constitutional: Positive for malaise/fatigue.  Musculoskeletal: Positive for joint pain and myalgias.  Psychiatric/Behavioral: Positive for depression. The patient has insomnia.   All other systems reviewed and are negative.   Blood pressure 128/82, pulse 66, height _0  (1.676 m), weight 184 lb (83.5 kg), SpO2 98 %.Body mass index is 29.7 kg/m.  General Appearance: Casual, Neat and Well Groomed  Eye Contact:  None  Speech:  Clear and Coherent  Volume:  Decreased  Mood:  Depressed  Affect:  Constricted and Flat  Thought Process:  Goal Directed  Orientation:  Full (Time, Place, and Person)  Thought Content: Rumination   Suicidal Thoughts:  No  Homicidal Thoughts:  No  Memory:  Immediate;   Good Recent;   Good Remote;   Good  Judgement:  Good  Insight:  Good  Psychomotor Activity:  Decreased  Concentration:  Concentration: Good and Attention Span: Good  Recall:  Good  Fund of Knowledge: Good  Language: Good  Akathisia:  No  Handed:  Right  AIMS (if indicated): not done  Assets:  Communication Skills Desire for Improvement Resilience Social  Support Talents/Skills  ADL's:  Intact  Cognition: WNL  Sleep:  Poor   Screenings: PHQ2-9     Office Visit from 02/16/2018 in [facility] Visit from 11/16/2017 in  [facility]  PHQ-2 Total Score  3  0  PHQ-9 Total Score  12  -       Assessment and Plan: This patient is a 73 year old female with a history of blindness, seizure disorder, autoimmune disorder and chronic fatigue who is now more depressed due to a worsening of her autoimmune disorder.  The gabapentin seems to be making things worse so I have suggested that she taper this off.  We will start amitriptyline 10 mg at bedtime for 1 week and then advance to 20 mg.  She will also use Xanax 1 mg daily as needed for anxiety.  I will look into other medication for the non-circadian sleep disorder.  She will return to see me in 6 weeks   [name], MD 03/01/2018, 1:40 PM

## 2018-03-02 ENCOUNTER — Ambulatory Visit (INDEPENDENT_AMBULATORY_CARE_PROVIDER_SITE_OTHER): Payer: Medicare Other | Admitting: Neurology

## 2018-03-02 ENCOUNTER — Encounter: Payer: Self-pay | Admitting: Neurology

## 2018-03-02 VITALS — BP 122/86 | HR 70 | Wt 184.0 lb

## 2018-03-02 DIAGNOSIS — R5382 Chronic fatigue, unspecified: Secondary | ICD-10-CM

## 2018-03-02 DIAGNOSIS — G40409 Other generalized epilepsy and epileptic syndromes, not intractable, without status epilepticus: Secondary | ICD-10-CM | POA: Diagnosis not present

## 2018-03-02 DIAGNOSIS — Z5181 Encounter for therapeutic drug level monitoring: Secondary | ICD-10-CM

## 2018-03-02 DIAGNOSIS — E538 Deficiency of other specified B group vitamins: Secondary | ICD-10-CM

## 2018-03-02 MED ORDER — CARBATROL 200 MG PO CP12: 200.0000 mg | ORAL_CAPSULE | Freq: Every day | ORAL | 1 refills | Status: DC

## 2018-03-02 NOTE — Progress Notes (Signed)
Reason for visit: Seizure-like events  Erica Bennett is an 73 y.o. female  History of present illness:  Erica Bennett is a 73 year old white female with a history of seizure-like events, she has mixed connective tissue disease, she is on Plaquenil for this.  Over the last 3 weeks she has felt poorly, she has had increased fatigue and malaise.  She has had difficulty with balance that has been persistent, MRI of the brain was done after her last revisit, this was unremarkable.  The patient was placed on gabapentin for some of her joint discomfort, this resulted in increased gait instability, she fortunately has not had any falls.  She has significant degenerative arthritis affecting the right knee and may require surgery in the future.  The patient has been referred to a pain center, [facility].  She has chronic low back pain.  She returns for evaluation.  The patient has not had any seizure type events in many years.  Past Medical History:  Diagnosis Date  . Blindness of both eyes   . Elevated cholesterol   . GERD (gastroesophageal reflux disease)   . Headache   . Hypertension   . Seizures ([place])   . Skin cancer     Past Surgical History:  Procedure Laterality Date  . ABDOMINAL HYSTERECTOMY    . Bone spur removed from shoulder    . CHOLECYSTECTOMY    . ENUCLEATION Bilateral   . TONSILLECTOMY      Family History  Problem Relation Age of Onset  . Depression Paternal Aunt   . Alcohol abuse Paternal Uncle   . Drug abuse Paternal Uncle   . Depression Cousin   . Depression Maternal Aunt   . Colon cancer Mother   . Colon cancer Maternal Aunt   . Colon cancer Other     Social history:  reports that she has quit smoking. She has never used smokeless tobacco. She reports that she does not drink alcohol or use drugs.    Allergies  Allergen Reactions  . Bacitracin Rash  . Neomycin Rash  . Sulfa Antibiotics Swelling and Rash  . Sulfamethoxazole Other (See Comments) and  Swelling  . Prozac [Fluoxetine Hcl]     Severe headache    Medications:  Prior to Admission medications   Medication Sig Start Date End Date Taking? Authorizing Provider  ALPRAZolam Duanne Moron) 1 MG tablet TAKE ONE TABLET BY MOUTH AT BEDTIME AS NEEDED FOR ANXIETY 03/01/18  Yes Cloria Spring, MD  amitriptyline (ELAVIL) 10 MG tablet Take one at bedtime for one week, then increase to 2 at bedtime 03/01/18  Yes Cloria Spring, MD  CARBATROL 200 MG 12 hr capsule TAKE ONE CAPSULE BY MOUTH TWICE DAILY. 02/24/18  Yes Claretta Fraise, MD  dexlansoprazole (DEXILANT) 60 MG capsule Take 1 capsule (60 mg total) by mouth daily. On an empty stomach, then do not eat for 1 hour 11/22/17  Yes Stacks, Cletus Gash, MD  hydroxychloroquine (PLAQUENIL) 200 MG tablet Take 1 tablet (200 mg total) by mouth 2 (two) times daily. 11/16/17  Yes Stacks, Cletus Gash, MD  MAXALT 10 MG tablet Take 10 mg by mouth as needed.  05/08/14  Yes [provider]  metoprolol succinate (TOPROL-XL) 100 MG 24 hr tablet Take 1 tablet (100 mg total) by mouth daily. 11/16/17  Yes Stacks, Cletus Gash, MD  Na Sulfate-K Sulfate-Mg Sulf 17.5-3.13-1.6 GM/177ML SOLN Take 1 kit by mouth as directed. 02/23/18  Yes Fields, Marga Melnick, MD  Nutritional Supplements (ECHINACEA/GOLDENSEAL IMMUNE)  CAPS Take 1 capsule by mouth daily.   Yes [provider]  prednisoLONE acetate (PRED FORTE) 1 % ophthalmic suspension INSTILL ONE DROP IN [facility] EYE TWICE DAILY. 06/05/16  Yes [provider]  Probiotic Product (PROBIOTIC DAILY PO) Take by mouth. Renew Life Ultimate Flora Probiotic 1 tablet (150 billion) live cultures   Yes [provider]    ROS:  Out of a complete 14 system review of symptoms, the patient complains only of the following symptoms, and all other reviewed systems are negative.  Decreased activity, decreased appetite, chills, fatigue Ringing in the ears Cough Cold intolerance, flushing Swollen abdomen, abdominal pain, diarrhea,  nausea Insomnia, snoring  Blood pressure 122/86, pulse 70, weight 184 lb (83.5 kg), SpO2 94 %.  Physical Exam  General: The patient is alert and cooperative at the time of the examination.  Skin: No significant peripheral edema is noted.   Neurologic Exam  Mental status: The patient is alert and oriented x 3 at the time of the examination. The patient has apparent normal recent and remote memory, with an apparently normal attention span and concentration ability.   Cranial nerves: Facial symmetry is present. Speech is normal, no aphasia or dysarthria is noted.  The patient is blind.  Motor: The patient has good strength in all 4 extremities.  Sensory examination: Soft touch sensation is symmetric on the face, arms, and legs.  Coordination: The patient has good finger-nose-finger and heel-to-shin bilaterally.  Gait and station: The patient has a normal gait. Romberg is negative. No drift is seen.  Reflexes: Deep tendon reflexes are symmetric.   MRI brain 04/30/17:  IMPRESSION: No explanation for the history. Normal appearance of the cerebellum and brainstem.  * MRI scan images were reviewed online. I agree with the written report.    Assessment/Plan:  1.  Seizure-like events  2.  Reports of gait instability  3.  Malaise, fatigue  The patient will have blood work done today.  The carbamazepine dose will be reduced to 200 mg daily.  The patient will follow-up in 6 months.  Jill Alexanders MD 03/02/2018 3:30 PM  [facility] [address] [place], [place] [address]  Phone [phone] Fax [phone]

## 2018-03-03 ENCOUNTER — Telehealth: Payer: Self-pay | Admitting: *Deleted

## 2018-03-03 LAB — COMPREHENSIVE METABOLIC PANEL
ALT: 16 IU/L (ref 0–32)
AST: 12 IU/L (ref 0–40)
Albumin/Globulin Ratio: 2 (ref 1.2–2.2)
Albumin: 4.7 g/dL (ref 3.8–4.8)
Alkaline Phosphatase: 115 IU/L (ref 39–117)
BUN/Creatinine Ratio: 14 (ref 12–28)
BUN: 10 mg/dL (ref 8–27)
Bilirubin Total: 0.2 mg/dL (ref 0.0–1.2)
CO2: 21 mmol/L (ref 20–29)
Calcium: 9.7 mg/dL (ref 8.7–10.3)
Chloride: 95 mmol/L — ABNORMAL LOW (ref 96–106)
Creatinine, Ser: 0.7 mg/dL (ref 0.57–1.00)
GFR calc Af Amer: 102 mL/min/{1.73_m2} (ref >59)
GFR, EST NON AFRICAN AMERICAN: 88 mL/min/{1.73_m2} (ref >59)
Globulin, Total: 2.4 g/dL (ref 1.5–4.5)
Glucose: 88 mg/dL (ref 65–99)
Potassium: 4.1 mmol/L (ref 3.5–5.2)
Sodium: 133 mmol/L — ABNORMAL LOW (ref 134–144)
Total Protein: 7.1 g/dL (ref 6.0–8.5)

## 2018-03-03 LAB — CBC WITH DIFFERENTIAL/PLATELET
BASOS: 1 %
Basophils Absolute: 0.1 10*3/uL (ref 0.0–0.2)
EOS (ABSOLUTE): 0.2 10*3/uL (ref 0.0–0.4)
Eos: 2 %
Hematocrit: 41 % (ref 34.0–46.6)
Hemoglobin: 14.2 g/dL (ref 11.1–15.9)
IMMATURE GRANS (ABS): 0 10*3/uL (ref 0.0–0.1)
Immature Granulocytes: 0 %
LYMPHS: 21 %
Lymphocytes Absolute: 1.9 10*3/uL (ref 0.7–3.1)
MCH: 29 pg (ref 26.6–33.0)
MCHC: 34.6 g/dL (ref 31.5–35.7)
MCV: 84 fL (ref 79–97)
Monocytes Absolute: 1 10*3/uL — ABNORMAL HIGH (ref 0.1–0.9)
Monocytes: 10 %
NEUTROS PCT: 66 %
Neutrophils Absolute: 6.3 10*3/uL (ref 1.4–7.0)
Platelets: 312 10*3/uL (ref 150–450)
RBC: 4.9 x10E6/uL (ref 3.77–5.28)
RDW: 12.6 % (ref 11.7–15.4)
WBC: 9.5 10*3/uL (ref 3.4–10.8)

## 2018-03-03 LAB — VITAMIN B12: Vitamin B-12: 542 pg/mL (ref 232–1245)

## 2018-03-03 LAB — CARBAMAZEPINE LEVEL, TOTAL: Carbamazepine (Tegretol), S: 6.8 ug/mL (ref 4.0–12.0)

## 2018-03-03 LAB — TSH: TSH: 2.94 u[IU]/mL (ref 0.450–4.500)

## 2018-03-03 NOTE — Telephone Encounter (Signed)
-----   Message from Kathrynn Ducking, MD sent at 03/03/2018  7:57 AM EST ----- Blood work is unremarkable exception of a slightly low sodium level and chloride level, likely related to carbamazepine.  Reducing the dose may improve this issue.  All of the blood work was unremarkable.  Please call the patient. ----- Message ----- From: Lavone Neri Lab Results In Sent: 03/03/2018   7:38 AM EST To: Kathrynn Ducking, MD

## 2018-03-03 NOTE — Telephone Encounter (Signed)
Spoke with pt. and reviewed below lab work. Reinforced decrease in Carbamazepine to 200mg  daily. She verbalized understanding of same/fim

## 2018-03-08 ENCOUNTER — Telehealth: Payer: Self-pay | Admitting: Family Medicine

## 2018-03-08 NOTE — Telephone Encounter (Signed)
PT spouse has called wanting to let Dr Livia Snellen know anonymously that the pt Needs something for depression, states that she is really down on dumps about her medical issues. Spouse wanted dr Livia Snellen to know before wifes apt next week.    Spouse said no need to call back him or his wife. Doesn't want his wife to know.

## 2018-03-08 NOTE — Telephone Encounter (Signed)
FYI

## 2018-03-18 ENCOUNTER — Encounter: Payer: Self-pay | Admitting: Family Medicine

## 2018-03-18 ENCOUNTER — Ambulatory Visit (INDEPENDENT_AMBULATORY_CARE_PROVIDER_SITE_OTHER): Payer: Medicare Other | Admitting: Family Medicine

## 2018-03-18 VITALS — BP 134/67 | HR 71 | Temp 97.1°F | Ht 66.0 in | Wt 184.2 lb

## 2018-03-18 DIAGNOSIS — I10 Essential (primary) hypertension: Secondary | ICD-10-CM | POA: Diagnosis not present

## 2018-03-18 DIAGNOSIS — F1120 Opioid dependence, uncomplicated: Secondary | ICD-10-CM | POA: Diagnosis not present

## 2018-03-18 DIAGNOSIS — G894 Chronic pain syndrome: Secondary | ICD-10-CM | POA: Diagnosis not present

## 2018-03-18 MED ORDER — MAXALT 10 MG PO TABS: 10.0000 mg | ORAL_TABLET | ORAL | 5 refills | Status: AC | PRN

## 2018-03-18 NOTE — Progress Notes (Signed)
Subjective:  Patient ID: Erica Bennett, female    DOB: [birth year]  Age: 73 y.o. MRN: [id number]  CC: Back Pain (pt here today for 1 month follow up and gabapentin caused balance issues so she had to stop taking it all together)   HPI TONJA JEZEWSKI presents for recheck of chronic pain. No relief from gabapentin. Took it once and felt so dizzy she DCed the medication. She also had similar symptoms in the past with trial of Lyrica. Currently pain is 5/10 on a good day. Goes to 8/10 on bad days. Most days are bad. Pain is in her metatarsals and metacarpals. Also in her lower back, hips, knees and ankles.   Depression screen [facility] 2/9 03/18/2018 02/16/2018 11/16/2017  Decreased Interest 1 1 0  Down, Depressed, Hopeless 2 2 0  PHQ - 2 Score 3 3 0  Altered sleeping 3 3 -  Tired, decreased energy 3 3 -  Change in appetite 0 0 -  Feeling bad or failure about yourself  2 3 -  Trouble concentrating 0 0 -  Moving slowly or fidgety/restless 0 0 -  Suicidal thoughts 0 0 -  PHQ-9 Score 11 12 -    History Channa has a past medical history of Blindness of both eyes, Elevated cholesterol, GERD (gastroesophageal reflux disease), Headache, Hypertension, Seizures ([place]), and Skin cancer.   She has a past surgical history that includes Enucleation (Bilateral); Abdominal hysterectomy; Tonsillectomy; Cholecystectomy; and Bone spur removed from shoulder.   Her family history includes Alcohol abuse in her paternal uncle; Colon cancer in her maternal aunt, mother, and another family member; Depression in her cousin, maternal aunt, and paternal aunt; Drug abuse in her paternal uncle.She reports that she has quit smoking. She has never used smokeless tobacco. She reports that she does not drink alcohol or use drugs.    ROS Review of Systems  Constitutional: Negative.   HENT: Negative for congestion.   Eyes: Negative for visual disturbance.  Respiratory: Negative for shortness of breath.     Cardiovascular: Negative for chest pain.  Gastrointestinal: Negative for abdominal pain, constipation, diarrhea, nausea and vomiting.  Genitourinary: Negative for difficulty urinating.  Musculoskeletal: Positive for arthralgias, back pain, joint swelling and myalgias.  Neurological: Negative for headaches.  Psychiatric/Behavioral: Negative for sleep disturbance.    Objective:  BP 134/67   Pulse 71   Temp (!) 97.1 F (36.2 C) (Oral)   Ht _0  (1.676 m)   Wt 184 lb 4 oz (83.6 kg)   BMI 29.74 kg/m   BP Readings from Last 3 Encounters:  03/18/18 134/67  03/02/18 122/86  02/23/18 (!) 116/58    Wt Readings from Last 3 Encounters:  03/18/18 184 lb 4 oz (83.6 kg)  03/02/18 184 lb (83.5 kg)  02/23/18 186 lb 12.8 oz (84.7 kg)     Physical Exam Constitutional:      General: She is not in acute distress.    Appearance: She is well-developed.  HENT:     Head: Normocephalic and atraumatic.     Right Ear: External ear normal.     Left Ear: External ear normal.     Nose: Nose normal.  Eyes:     Conjunctiva/sclera: Conjunctivae normal.     Pupils: Pupils are equal, round, and reactive to light.  Neck:     Musculoskeletal: Normal range of motion and neck supple.     Thyroid: No thyromegaly.  Cardiovascular:     Rate and Rhythm: Normal rate  and regular rhythm.     Heart sounds: Normal heart sounds. No murmur.  Pulmonary:     Effort: Pulmonary effort is normal. No respiratory distress.     Breath sounds: Normal breath sounds. No wheezing or rales.  Abdominal:     General: Bowel sounds are normal. There is no distension.     Palpations: Abdomen is soft.     Tenderness: There is no abdominal tenderness.  Lymphadenopathy:     Cervical: No cervical adenopathy.  Skin:    General: Skin is warm and dry.  Neurological:     Mental Status: She is alert and oriented to person, place, and time.     Deep Tendon Reflexes: Reflexes are normal and symmetric.  Psychiatric:         Behavior: Behavior normal.        Thought Content: Thought content normal.        Judgment: Judgment normal.       Assessment & Plan:   Leafy was seen today for back pain.  Diagnoses and all orders for this visit:  Chronic pain syndrome  Uncomplicated opioid dependence ([place])  Essential hypertension  Other orders -     MAXALT 10 MG tablet; Take 1 tablet (10 mg total) by mouth as needed.       I have discontinued [place] Slider. Lofstrom's amitriptyline. I have also changed her MAXALT. Additionally, I am having her maintain her prednisoLONE acetate, ECHINACEA/GOLDENSEAL IMMUNE, metoprolol succinate, hydroxychloroquine, dexlansoprazole, Probiotic Product (PROBIOTIC DAILY PO), Na Sulfate-K Sulfate-Mg Sulf, ALPRAZolam, and CARBATROL.  Allergies as of 03/18/2018      Reactions   Bacitracin Rash   Neomycin Rash   Sulfa Antibiotics Swelling, Rash   Sulfamethoxazole Other (See Comments), Swelling   Elavil [amitriptyline Hcl] Other (See Comments)   Really bad tremors   Gabapentin Other (See Comments)   Balance issues   Prozac [fluoxetine Hcl]    Severe headache      Medication List       Accurate as of March 18, 2018 11:59 PM. Always use your most recent med list.        ALPRAZolam 1 MG tablet Commonly known as:  XANAX TAKE ONE TABLET BY MOUTH AT BEDTIME AS NEEDED FOR ANXIETY   CARBATROL 200 MG 12 hr capsule Generic drug:  carbamazepine Take 1 capsule (200 mg total) by mouth daily.   dexlansoprazole 60 MG capsule Commonly known as:  DEXILANT Take 1 capsule (60 mg total) by mouth daily. On an empty stomach, then do not eat for 1 hour   ECHINACEA/GOLDENSEAL IMMUNE Caps Take 1 capsule by mouth daily.   hydroxychloroquine 200 MG tablet Commonly known as:  PLAQUENIL Take 1 tablet (200 mg total) by mouth 2 (two) times daily.   MAXALT 10 MG tablet Generic drug:  rizatriptan Take 1 tablet (10 mg total) by mouth as needed.   metoprolol succinate 100 MG 24 hr  tablet Commonly known as:  TOPROL-XL Take 1 tablet (100 mg total) by mouth daily.   Na Sulfate-K Sulfate-Mg Sulf 17.5-3.13-1.6 GM/177ML Soln Take 1 kit by mouth as directed.   prednisoLONE acetate 1 % ophthalmic suspension Commonly known as:  PRED FORTE INSTILL ONE DROP IN EACH EYE TWICE DAILY.   PROBIOTIC DAILY PO Take by mouth. Renew Life Ultimate Flora Probiotic 1 tablet (150 billion) live cultures        Follow-up: Return in about 3 months (around 06/16/2018).  Claretta Fraise, M.D.

## 2018-03-22 ENCOUNTER — Encounter: Payer: Self-pay | Admitting: Family Medicine

## 2018-03-22 DIAGNOSIS — F1120 Opioid dependence, uncomplicated: Secondary | ICD-10-CM | POA: Insufficient documentation

## 2018-03-22 DIAGNOSIS — G894 Chronic pain syndrome: Secondary | ICD-10-CM | POA: Insufficient documentation

## 2018-03-22 DIAGNOSIS — I10 Essential (primary) hypertension: Secondary | ICD-10-CM | POA: Insufficient documentation

## 2018-04-05 DIAGNOSIS — M129 Arthropathy, unspecified: Secondary | ICD-10-CM | POA: Diagnosis not present

## 2018-04-05 DIAGNOSIS — G894 Chronic pain syndrome: Secondary | ICD-10-CM | POA: Diagnosis not present

## 2018-04-05 DIAGNOSIS — M359 Systemic involvement of connective tissue, unspecified: Secondary | ICD-10-CM | POA: Diagnosis not present

## 2018-04-05 DIAGNOSIS — E559 Vitamin D deficiency, unspecified: Secondary | ICD-10-CM | POA: Diagnosis not present

## 2018-04-05 DIAGNOSIS — Z79899 Other long term (current) drug therapy: Secondary | ICD-10-CM | POA: Diagnosis not present

## 2018-04-12 ENCOUNTER — Ambulatory Visit (INDEPENDENT_AMBULATORY_CARE_PROVIDER_SITE_OTHER): Payer: Medicare Other | Admitting: Psychiatry

## 2018-04-12 ENCOUNTER — Encounter (HOSPITAL_COMMUNITY): Payer: Self-pay | Admitting: Psychiatry

## 2018-04-12 ENCOUNTER — Other Ambulatory Visit: Payer: Self-pay

## 2018-04-12 VITALS — BP 115/73 | HR 68 | Ht 66.0 in | Wt 184.0 lb

## 2018-04-12 DIAGNOSIS — Z79899 Other long term (current) drug therapy: Secondary | ICD-10-CM

## 2018-04-12 DIAGNOSIS — F321 Major depressive disorder, single episode, moderate: Secondary | ICD-10-CM | POA: Diagnosis not present

## 2018-04-12 MED ORDER — ALPRAZOLAM 1 MG PO TABS: ORAL_TABLET | ORAL | 2 refills | Status: DC

## 2018-04-12 NOTE — Progress Notes (Signed)
[facility] MD/PA/NP OP Progress Note  04/12/2018 3:24 PM Erica Bennett  MRN:  [id number]  Chief Complaint:  Chief Complaint    Anxiety; Follow-up     EHU:DJSH patient is a 73 year old married white female who lives with her husband in [place]. They've been married for 14years. She has no children. She is on disability for congenital blindness.  The patient was referred by Dr.Hosanji, her primary physician, for further assessment of depression anxiety.  The patient states that her mother contracted rubella when she was pregnant with her. She developed congenital blindness that worsened in her 53s with severe macular degeneration. By her 107s she was totally blind. 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  She has learned Braille and gets help through the [employer]. She was very comfortable doing things for herself. Both her parents are deceased and she doesn't have any other family. 11 years ago she reconnected with a man that she grown up with all her life. He had a history of alcoholism but claimed he had been sober for 5 years. They got married initially it went okay but then he began drinking. He goes through alcoholic bouts that can last months to years and he usually drinks about a half gallon of vodka a day. He last went through treatment last June but around March of this year he started drinking again. She felt very taken advantage of that she can't see what he is doing but she has been finding the bottles in confronting him and he is been lying about it. This is made her very uncomfortable. He is her power of attorney and they share a bank account. They've been arguing a lot more and she is become increasingly anxious and somewhat depressed.  The patient did have some prior treatment in her 92s when she went  blind. She saw psychiatrist to help deal with the change and also took Paxil for a while. She's not had any treatment since. She's very active in Al-Anon and has several good friends from the program. She knows that she should detach from her alcoholic husband but it's difficult to do so because of her dependency on him.  The patient has always had difficulty sleeping because of the circadian problem with being blind. She has tried [place] and Ambien but they didn't help and the newer drugs for this are extremely expensive. She is on a low dose of Xanax which is helped a little bit but she only gets about 3-4 hours of sleep a night. She is tired through the day, her energy is low. She denies suicidal ideation. She enjoys doing things with friends but will often walk him around because of her husbands behavior. She herself does not drink or use drugs and she's never had psychotic symptoms  The patient returns after 4 weeks.  Last time she was having a flareup of her autoimmune disease and her joints were hurting and she was having a lot of trouble sleeping.  We tried amitriptyline but she states it made her feel even worse.  She has stopped it.  What finally helped is getting back on her Percocet through pain management.  Now her pain has subsided and she is feeling better and more able to sleep.  She also has some CBD rub on ointment that  has helped as well.  She is back to sleeping about 5 to 6 hours a night.  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  We will follow-up on this. Visit Diagnosis:    ICD-10-CM   1. 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 Blindness of both eyes   . Elevated cholesterol   . GERD  (gastroesophageal reflux disease)   . Headache   . Hypertension   . Seizures ([place])   . Skin cancer     Past Surgical History:  Procedure Laterality Date  . ABDOMINAL HYSTERECTOMY    . Bone spur removed from shoulder    . CHOLECYSTECTOMY    . ENUCLEATION Bilateral   . TONSILLECTOMY      Family Psychiatric History: See below  Family History:  Family History  Problem Relation Age of Onset  . Depression Paternal Aunt   . Alcohol abuse Paternal Uncle   . Drug abuse Paternal Uncle   . Depression Cousin   . Depression Maternal Aunt   . Colon cancer Mother   . Colon cancer Maternal Aunt   . Colon cancer Other     Social History:  Social History   Socioeconomic History  . Marital status: Married    Spouse name: Not on file  . Number of children: 0  . Years of education: 73  . Highest education level: Not on file  Occupational History  . Occupation: Retired  Scientific laboratory technician  . Financial resource strain: Not on file  . Food insecurity:    Worry: Not on file    Inability: Not on file  . Transportation needs:    Medical: Not on file    Non-medical: Not on file  Tobacco Use  . Smoking status: Former Research scientist (life sciences)  . Smokeless tobacco: Never Used  . Tobacco comment: Quit 23 years ago  Substance and Sexual Activity  . Alcohol use: No    Alcohol/week: 0.0 standard drinks  . Drug use: No  . Sexual activity: Yes    Birth control/protection: Surgical  Lifestyle  . Physical activity:    Days per week: Not on file    Minutes per session: Not on file  . Stress: Not on file  Relationships  . Social connections:    Talks on phone: Not on file    Gets together: Not on file    Attends religious service: Not on file    Active member of club or organization: Not on file    Attends meetings of clubs or organizations: Not on file    Relationship status: Not on file  Other Topics Concern  . Not on file  Social History Narrative   PO[employer]   Caffeine use: daily (1 soda per day, 1  coffee per day)   Left handed    Legally blind      Worked as LPN/RN for about 8 years    Allergies:  Allergies  Allergen Reactions  . Bacitracin Rash  . Neomycin Rash  . Sulfa Antibiotics Swelling and Rash  . Sulfamethoxazole Other (See Comments) and Swelling  . Elavil [Amitriptyline Hcl] Other (See Comments)    Really bad tremors  . Gabapentin Other (See Comments)    Balance issues  .  Prozac [Fluoxetine Hcl]     Severe headache    Metabolic Disorder Labs: No results found for: HGBA1C, MPG No results found for: PROLACTIN Lab Results  Component Value Date   CHOL 203 (H) 11/18/2017   TRIG 227 (H) 11/18/2017   HDL 51 11/18/2017   CHOLHDL 4.0 11/18/2017   LDLCALC 107 (H) 11/18/2017   Lab Results  Component Value Date   TSH 2.940 03/02/2018   TSH 1.56 10/26/2017    Therapeutic Level Labs: No results found for: LITHIUM No results found for: VALPROATE No components found for:  CBMZ  Current Medications: Current Outpatient Medications  Medication Sig Dispense Refill  . ALPRAZolam (XANAX) 1 MG tablet TAKE ONE TABLET BY MOUTH AT BEDTIME AS NEEDED FOR ANXIETY 30 tablet 2  . CARBATROL 200 MG 12 hr capsule Take 1 capsule (200 mg total) by mouth daily. 180 capsule 1  . dexlansoprazole (DEXILANT) 60 MG capsule Take 1 capsule (60 mg total) by mouth daily. On an empty stomach, then do not eat for 1 hour 30 capsule 5  . hydroxychloroquine (PLAQUENIL) 200 MG tablet Take 1 tablet (200 mg total) by mouth 2 (two) times daily. 180 tablet 1  . MAXALT 10 MG tablet Take 1 tablet (10 mg total) by mouth as needed. 10 tablet 5  . metoprolol succinate (TOPROL-XL) 100 MG 24 hr tablet Take 1 tablet (100 mg total) by mouth daily. 90 tablet 1  . Na Sulfate-K Sulfate-Mg Sulf 17.5-3.13-1.6 GM/177ML SOLN Take 1 kit by mouth as directed. 1 Bottle 0  . Nutritional Supplements (ECHINACEA/GOLDENSEAL IMMUNE) CAPS Take 1 capsule by mouth daily.    . prednisoLONE acetate (PRED FORTE) 1 % ophthalmic  suspension INSTILL ONE DROP IN [facility] EYE TWICE DAILY.    . Probiotic Product (PROBIOTIC DAILY PO) Take by mouth. Renew Life Ultimate Flora Probiotic 1 tablet (150 billion) live cultures    . HYDROcodone-acetaminophen (NORCO/VICODIN) 5-325 MG tablet Take 1 tablet by mouth 2 (two) times daily as needed.    Marland Kitchen NARCAN 4 MG/0.1ML LIQD nasal spray kit USE 1 SPRAY IN ONE NOSTRIL. REPEAT AFTER 3 MINUTES IF NO OR MINIMAL RESPONSE. CALL 911 AFTER DOSE IS GIVEN.     No current facility-administered medications for this visit.      Musculoskeletal: Strength & Muscle Tone: decreased Gait & Station: normal Patient leans: N/A  Psychiatric Specialty Exam: Review of Systems  Musculoskeletal: Positive for back pain, joint pain and myalgias.  All other systems reviewed and are negative.   Blood pressure 115/73, pulse 68, height _0  (1.676 m), weight 184 lb (83.5 kg), SpO2 97 %.Body mass index is 29.7 kg/m.  General Appearance: Casual, Neat and Well Groomed  Eye Contact:  Absent Pt is blind  Speech:  Clear and Coherent  Volume:  Normal  Mood:  Euthymic  Affect:  Appropriate and Congruent  Thought Process:  Goal Directed  Orientation:  Full (Time, Place, and Person)  Thought Content: WDL   Suicidal Thoughts:  No  Homicidal Thoughts:  No  Memory:  Immediate;   Good Recent;   Good Remote;   Good  Judgement:  Good  Insight:  Good  Psychomotor Activity:  Decreased  Concentration:  Concentration: Fair and Attention Span: Fair  Recall:  Good  Fund of Knowledge: Good  Language: Good  Akathisia:  No  Handed:  Right  AIMS (if indicated): not done  Assets:  Communication Skills Desire for Improvement Resilience Social Support Talents/Skills  ADL's:  Intact  Cognition: WNL  Sleep:  Fair   Screenings: PHQ2-9     Office Visit from 03/18/2018 in [place] Visit from 02/16/2018 in [place] Visit from 11/16/2017 in [place]  PHQ-2 Total Score  3  3  0  PHQ-9 Total Score  11  12  -       Assessment and Plan: This patient is a 89-year-old female with a history of congenital blindness, autoimmune disorder with fibromyalgia and anxiety.  She is sleeping well with just the Xanax 1 mg at bedtime.  I still want to try to get her the [place] because it should improve her sleep overall.  We will work on this but she will otherwise return to see me in 3 months   [name], MD 04/12/2018, 3:24 PM

## 2018-04-18 DIAGNOSIS — E559 Vitamin D deficiency, unspecified: Secondary | ICD-10-CM | POA: Diagnosis not present

## 2018-04-18 DIAGNOSIS — Z79899 Other long term (current) drug therapy: Secondary | ICD-10-CM | POA: Diagnosis not present

## 2018-04-18 DIAGNOSIS — M359 Systemic involvement of connective tissue, unspecified: Secondary | ICD-10-CM | POA: Diagnosis not present

## 2018-04-18 DIAGNOSIS — G894 Chronic pain syndrome: Secondary | ICD-10-CM | POA: Diagnosis not present

## 2018-04-21 ENCOUNTER — Telehealth (HOSPITAL_COMMUNITY): Payer: Self-pay | Admitting: *Deleted

## 2018-04-21 NOTE — Telephone Encounter (Signed)
[facility] [employer] [facility] has APPROVED HETLIOZ CAPSULE  AUTHORIZED COVERAGE 01/26/2018 ----  10/16/2018

## 2018-04-25 ENCOUNTER — Other Ambulatory Visit: Payer: Self-pay | Admitting: Family Medicine

## 2018-05-10 ENCOUNTER — Telehealth: Payer: Self-pay

## 2018-05-10 NOTE — Telephone Encounter (Signed)
Called pt, TCS w/Propofol w/SLF that was for 05/24/18 rescheduled to 08/23/18 at 2:00pm d/t COVID-19 restrictions. Endo scheduler informed. Pre-op appt rescheduled to 08/18/18 at 11:00am. Appt letter mailed with new procedure instructions.

## 2018-05-13 DIAGNOSIS — M2550 Pain in unspecified joint: Secondary | ICD-10-CM | POA: Diagnosis not present

## 2018-05-13 DIAGNOSIS — Z79899 Other long term (current) drug therapy: Secondary | ICD-10-CM | POA: Diagnosis not present

## 2018-05-13 DIAGNOSIS — G894 Chronic pain syndrome: Secondary | ICD-10-CM | POA: Diagnosis not present

## 2018-05-13 DIAGNOSIS — M359 Systemic involvement of connective tissue, unspecified: Secondary | ICD-10-CM | POA: Diagnosis not present

## 2018-05-18 ENCOUNTER — Inpatient Hospital Stay (HOSPITAL_COMMUNITY): Admission: RE | Admit: 2018-05-18 | Payer: Self-pay | Source: Ambulatory Visit

## 2018-05-31 ENCOUNTER — Other Ambulatory Visit (HOSPITAL_COMMUNITY): Payer: Self-pay | Admitting: Psychiatry

## 2018-05-31 ENCOUNTER — Telehealth (HOSPITAL_COMMUNITY): Payer: Self-pay | Admitting: *Deleted

## 2018-05-31 MED ORDER — ALPRAZOLAM 1 MG PO TABS: 1.0000 mg | ORAL_TABLET | Freq: Every day | ORAL | 2 refills | Status: DC

## 2018-05-31 NOTE — Telephone Encounter (Signed)
sent 

## 2018-05-31 NOTE — Telephone Encounter (Signed)
She can try stopping the helioz and go back on the xanax at night

## 2018-05-31 NOTE — Telephone Encounter (Signed)
Dr Harrington Challenger Patient  Michela Pitcher she bo back on the Xanax & she picked up last Rx on 05/28/2018

## 2018-05-31 NOTE — Telephone Encounter (Signed)
Dr Harrington Challenger Patient called & said that the [facility] has "fizzled out" for instance she read 34 chapters in a book last night.  And for the  Last 4 nights she hasn't been sleeping. Asked what does she need to do ? # [id number]. Next refill shipment due on 06-07-2018

## 2018-06-10 DIAGNOSIS — Z79899 Other long term (current) drug therapy: Secondary | ICD-10-CM | POA: Diagnosis not present

## 2018-06-10 DIAGNOSIS — M359 Systemic involvement of connective tissue, unspecified: Secondary | ICD-10-CM | POA: Diagnosis not present

## 2018-06-10 DIAGNOSIS — G894 Chronic pain syndrome: Secondary | ICD-10-CM | POA: Diagnosis not present

## 2018-06-14 ENCOUNTER — Ambulatory Visit: Payer: Medicare Other | Admitting: Gastroenterology

## 2018-07-11 DIAGNOSIS — M359 Systemic involvement of connective tissue, unspecified: Secondary | ICD-10-CM | POA: Diagnosis not present

## 2018-07-11 DIAGNOSIS — G894 Chronic pain syndrome: Secondary | ICD-10-CM | POA: Diagnosis not present

## 2018-07-11 DIAGNOSIS — Z1159 Encounter for screening for other viral diseases: Secondary | ICD-10-CM | POA: Diagnosis not present

## 2018-07-11 DIAGNOSIS — Z79899 Other long term (current) drug therapy: Secondary | ICD-10-CM | POA: Diagnosis not present

## 2018-07-13 ENCOUNTER — Encounter (HOSPITAL_COMMUNITY): Payer: Self-pay | Admitting: Psychiatry

## 2018-07-13 ENCOUNTER — Other Ambulatory Visit: Payer: Self-pay

## 2018-07-13 ENCOUNTER — Ambulatory Visit (INDEPENDENT_AMBULATORY_CARE_PROVIDER_SITE_OTHER): Payer: Medicare Other | Admitting: Psychiatry

## 2018-07-13 DIAGNOSIS — F321 Major depressive disorder, single episode, moderate: Secondary | ICD-10-CM

## 2018-07-13 MED ORDER — ALPRAZOLAM 1 MG PO TABS: 1.0000 mg | ORAL_TABLET | Freq: Every day | ORAL | 2 refills | Status: DC

## 2018-07-13 NOTE — Progress Notes (Signed)
[facility] MD/PA/NP OP Progress Note  07/13/2018 2:11 PM Erica Bennett  MRN:  [id number]  Chief Complaint:  Chief Complaint    Anxiety; Follow-up     Virtual Visit via Telephone Note  I connected with Erica Bennett on 07/13/18 at  2:00 PM EDT by telephone and verified that I am speaking with the correct person using two identifiers.   I discussed the limitations, risks, security and privacy concerns of performing an evaluation and management service by telephone and the availability of in person appointments. I also discussed with the patient that there may be a patient responsible charge related to this service. The patient expressed understanding and agreed to proceed.     I discussed the assessment and treatment plan with the patient. The patient was provided an opportunity to ask questions and all were answered. The patient agreed with the plan and demonstrated an understanding of the instructions.   The patient was advised to call back or seek an in-person evaluation if the symptoms worsen or if the condition fails to improve as anticipated.  I provided 15 minutes of non-face-to-face time during this encounter.   [name], MD this patient is a73 year old married white female who lives with her husband in [place]. They've been married for 14years. She has no children. She is on disability for congenital blindness.  The patient was referred by Dr.Hosanji, her primary physician, for further assessment of depression anxiety.  The patient states that her mother contracted rubella when she was pregnant with her. She developed congenital blindness that worsened in her 51s with severe macular degeneration. By her 11s she was totally blind. 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  She has learned Braille and  gets help through the [employer]. She was very comfortable doing things for herself. Both her parents are deceased and she doesn't have any other family. 11 years ago she reconnected with a man that she grown up with all her life. He had a history of alcoholism but claimed he had been sober for 5 years. They got married initially it went okay but then he began drinking. He goes through alcoholic bouts that can last months to years and he usually drinks about a half gallon of vodka a day. He last went through treatment last June but around March of this year he started drinking again. She felt very taken advantage of that she can't see what he is doing but she has been finding the bottles in confronting him and he is been lying about it. This is made her very uncomfortable. He is her power of attorney and they share a bank account. They've been arguing a lot more and she is become increasingly anxious and somewhat depressed.  The patient did have some prior treatment in her 70s when she went blind. She saw psychiatrist to help deal with the change and also took Paxil for a while. She's not had any treatment since. She's very active in Al-Anon and has several good friends from the program. She knows that she should detach from her alcoholic husband but it's difficult to do so because of her dependency on him.  The patient has always had difficulty sleeping because of the circadian problem with being blind. She has tried [place] and Ambien but they didn't help  and the newer drugs for this are extremely expensive. She is on a low dose of Xanax which is helped a little bit but she only gets about 3-4 hours of sleep a night. She is tired through the day, her energy is low. She denies suicidal ideation. She enjoys doing things with friends but will often walk him around because of her husbands behavior. She herself does not drink or use drugs and she's never had psychotic symptoms  The patient returns  for follow-up after 3 months.  She is assessed via telephone due to the coronavirus pandemic.  We have tried to use Helioz for her non-circadian sleep problem.  It worked okay for the first 3 weeks and then she went for nights without sleeping.  She had called [place] back on Xanax at bedtime it seems to be working fairly well.  She also states that the Helioz caused severe nightmares and headaches for the first few days of use.  She denies being depressed.  She and her husband are listening to audiobooks and have gone back to church.  She states that her mood is good and her energy level is fairly good when she is able to take her pain medication.  Visit Diagnosis:    ICD-10-CM   1. Moderate single current episode of major depressive disorder (HCC)  F32.1     Past Psychiatric History: Outpatient treatment for depression  Past Medical History:  Past Medical History:  Diagnosis Date  . Blindness of both eyes   . Elevated cholesterol   . GERD (gastroesophageal reflux disease)   . Headache   . Hypertension   . Seizures ([place])   . Skin cancer     Past Surgical History:  Procedure Laterality Date  . ABDOMINAL HYSTERECTOMY    . Bone spur removed from shoulder    . CHOLECYSTECTOMY    . ENUCLEATION Bilateral   . TONSILLECTOMY      Family Psychiatric History: see below  Family History:  Family History  Problem Relation Age of Onset  . Depression Paternal Aunt   . Alcohol abuse Paternal Uncle   . Drug abuse Paternal Uncle   . Depression Cousin   . Depression Maternal Aunt   . Colon cancer Mother   . Colon cancer Maternal Aunt   . Colon cancer Other     Social History:  Social History   Socioeconomic History  . Marital status: Married    Spouse name: Not on file  . Number of children: 0  . Years of education: 18  . Highest education level: Not on file  Occupational History  . Occupation: Retired  Scientific laboratory technician  . Financial resource strain: Not on file  . Food insecurity     Worry: Not on file    Inability: Not on file  . Transportation needs    Medical: Not on file    Non-medical: Not on file  Tobacco Use  . Smoking status: Former Research scientist (life sciences)  . Smokeless tobacco: Never Used  . Tobacco comment: Quit 23 years ago  Substance and Sexual Activity  . Alcohol use: No    Alcohol/week: 0.0 standard drinks  . Drug use: No  . Sexual activity: Yes    Birth control/protection: Surgical  Lifestyle  . Physical activity    Days per week: Not on file    Minutes per session: Not on file  . Stress: Not on file  Relationships  . Social connections    Talks on phone: Not on file  Gets together: Not on file    Attends religious service: Not on file    Active member of club or organization: Not on file    Attends meetings of clubs or organizations: Not on file    Relationship status: Not on file  Other Topics Concern  . Not on file  Social History Narrative   POA-Dennis   Caffeine use: daily (1 soda per day, 1 coffee per day)   Left handed    Legally blind      Worked as LPN/RN for about 8 years    Allergies:  Allergies  Allergen Reactions  . Bacitracin Rash  . Neomycin Rash  . Sulfa Antibiotics Swelling and Rash  . Sulfamethoxazole Other (See Comments) and Swelling  . Elavil [Amitriptyline Hcl] Other (See Comments)    Really bad tremors  . Gabapentin Other (See Comments)    Balance issues  . Prozac [Fluoxetine Hcl]     Severe headache    Metabolic Disorder Labs: No results found for: HGBA1C, MPG No results found for: PROLACTIN Lab Results  Component Value Date   CHOL 203 (H) 11/18/2017   TRIG 227 (H) 11/18/2017   HDL 51 11/18/2017   CHOLHDL 4.0 11/18/2017   LDLCALC 107 (H) 11/18/2017   Lab Results  Component Value Date   TSH 2.940 03/02/2018   TSH 1.56 10/26/2017    Therapeutic Level Labs: No results found for: LITHIUM No results found for: VALPROATE No components found for:  CBMZ  Current Medications: Current Outpatient Medications   Medication Sig Dispense Refill  . ALPRAZolam (XANAX) 1 MG tablet Take 1 tablet (1 mg total) by mouth at bedtime. 30 tablet 2  . CARBATROL 200 MG 12 hr capsule Take 1 capsule (200 mg total) by mouth daily. (Patient taking differently: Take 200 mg by mouth at bedtime. ) 180 capsule 1  . DEXILANT 60 MG capsule TAKE ONE CAPSULE BY MOUTH DAILY; ON AN EMPTY STOMACH, THEN DO NOT EAT FOR ONE HOUR. (Patient taking differently: Take 60 mg by mouth daily before breakfast. ) 30 capsule 5  . ECHINACEA PO Take 1,520 mg by mouth daily. 760 mg per capsule    . HYDROcodone-acetaminophen (NORCO/VICODIN) 5-325 MG tablet Take 1 tablet by mouth 2 (two) times daily as needed (pain.).     Marland Kitchen hydroxychloroquine (PLAQUENIL) 200 MG tablet Take 1 tablet (200 mg total) by mouth 2 (two) times daily. 180 tablet 1  . MAXALT 10 MG tablet Take 1 tablet (10 mg total) by mouth as needed. (Patient taking differently: Take 10 mg by mouth 3 (three) times daily as needed for migraine. ) 10 tablet 5  . metoprolol succinate (TOPROL-XL) 100 MG 24 hr tablet Take 1 tablet (100 mg total) by mouth daily. (Patient taking differently: Take 100 mg by mouth every evening. ) 90 tablet 1  . Na Sulfate-K Sulfate-Mg Sulf 17.5-3.13-1.6 GM/177ML SOLN Take 1 kit by mouth as directed. 1 Bottle 0  . NARCAN 4 MG/0.1ML LIQD nasal spray kit Place 1 spray into the nose as needed (accidental overdose.).     Marland Kitchen prednisoLONE acetate (PRED FORTE) 1 % ophthalmic suspension Place 1 drop into both eyes 2 (two) times daily.     . Probiotic Product (PROBIOTIC DAILY PO) Take 1 capsule by mouth daily. Renew Life Ultimate Flora Probiotic 1 tablet (150 billion) live cultures      No current facility-administered medications for this visit.      Musculoskeletal: Strength & Muscle Tone: within normal limits Gait &  Station: normal Patient leans: N/A  Psychiatric Specialty Exam: Review of Systems  Eyes:       Patient is blind  Musculoskeletal: Positive for joint pain  and myalgias.  All other systems reviewed and are negative.   There were no vitals taken for this visit.There is no height or weight on file to calculate BMI.  General Appearance: NA  Eye Contact:  NA  Speech: Clear and coherent  Volume:  Normal  Mood:  Euthymic  Affect:  NA  Thought Process:  Goal Directed  Orientation:  Full (Time, Place, and Person)  Thought Content: WDL   Suicidal Thoughts:  No  Homicidal Thoughts:  No  Memory:  Immediate;   Good Recent;   Good Remote;   Good  Judgement:  Good  Insight:  Good  Psychomotor Activity:  Decreased  Concentration:  Concentration: Good and Attention Span: Good  Recall:  Good  Fund of Knowledge: Good  Language: Good  Akathisia:  No  Handed:  Right  AIMS (if indicated): not done  Assets:  Communication Skills Desire for Improvement Resilience Social Support Talents/Skills  ADL's:  Intact  Cognition: WNL  Sleep:  Fair   Screenings: PHQ2-9     Office Visit from 03/18/2018 in [facility] Visit from 02/16/2018 in [facility] Visit from 11/16/2017 in [facility]  PHQ-2 Total Score  3  3  0  PHQ-9 Total Score  11  12  -       Assessment and Plan:  This patient is a 73 year old female who is blind and has some difficulties with circadian cycle and sleep as well as anxiety.  We have tried various medications but she does best with the Xanax 1 mg at bedtime for anxiety and sleep.  She will continue this and return to see me in 4 months  [name], MD 07/13/2018, 2:11 PM

## 2018-07-23 IMAGING — MR MR HEAD W/O CM
6 of 10 series · 30 of 48 positions shown · non-contrast
Comparison: 01/07/2010.

CLINICAL DATA: Cerebellar ataxia.  Gait abnormality.

EXAM:
MRI HEAD WITHOUT CONTRAST
TECHNIQUE: Multiplanar, multiecho pulse sequences of the brain and surrounding
structures were obtained without intravenous contrast.

[Series 3: DWI · axial · 3.0mm · 0.75mm/px · z∈[-86,+61]mm · 5 of 50 slices shown (1 of 4)]
[im 1/50]
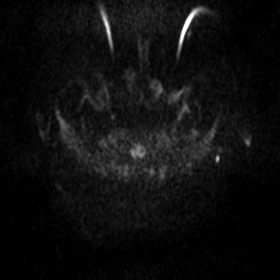
[im 13/50]
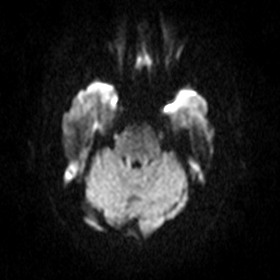
[im 25/50]
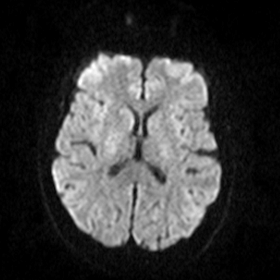
[im 37/50]
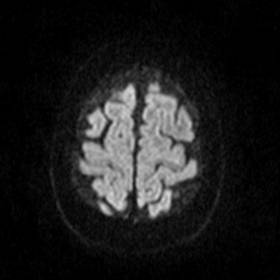
[im 50/50]
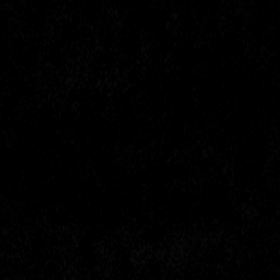

[Series 4: DWI · axial · 3.0mm · 0.75mm/px · z∈[-101,+58]mm · 7 of 54 slices shown (2 of 4)]
[im 1/54]
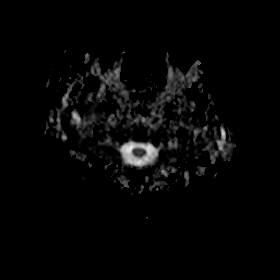
[im 9/54]
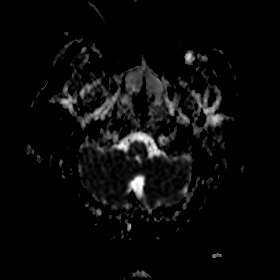
[im 18/54]
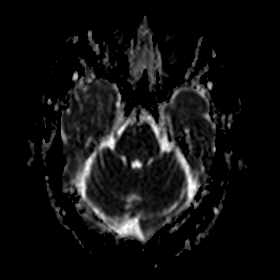
[im 27/54]
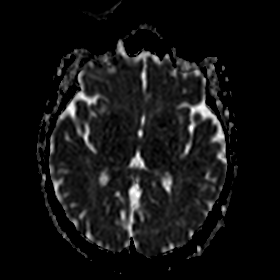
[im 36/54]
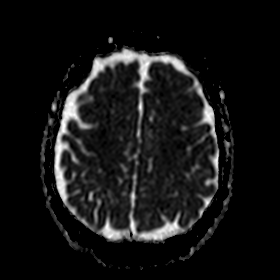
[im 45/54]
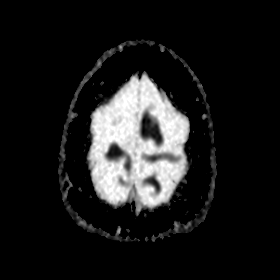
[im 54/54]
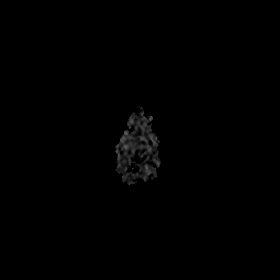

[Series 5: DWI · coronal · 5.0mm · 0.46mm/px · 4 of 36 slices shown (3 of 4)]
[im 1/36]
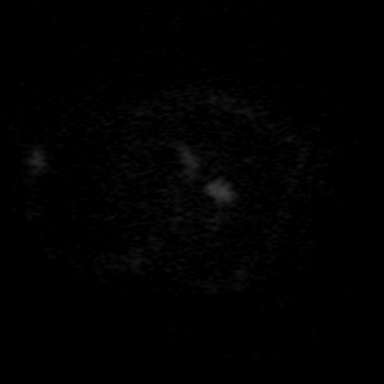
[im 12/36]
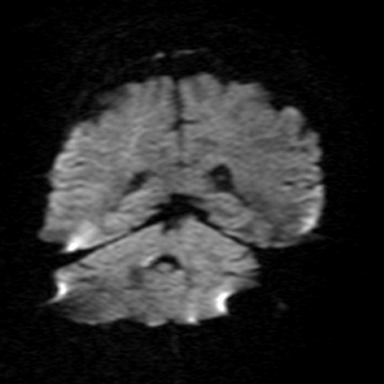
[im 24/36]
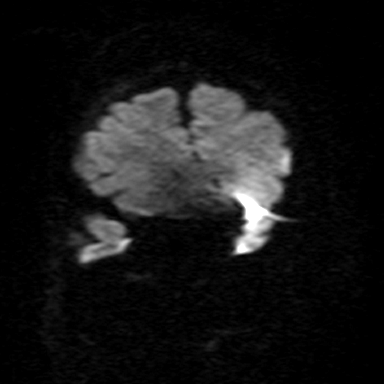
[im 36/36]
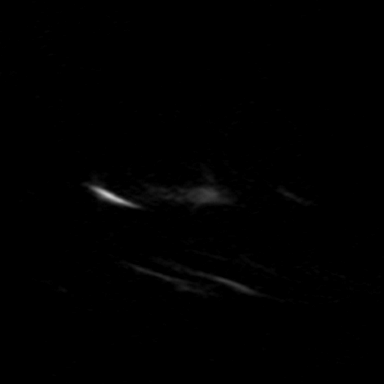

[Series 6: DWI · coronal · 5.0mm · 0.51mm/px · 5 of 38 slices shown (4 of 4)]
[im 1/38]
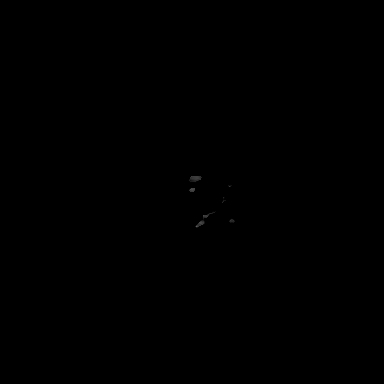
[im 10/38]
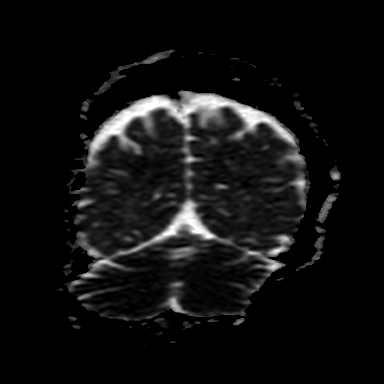
[im 19/38]
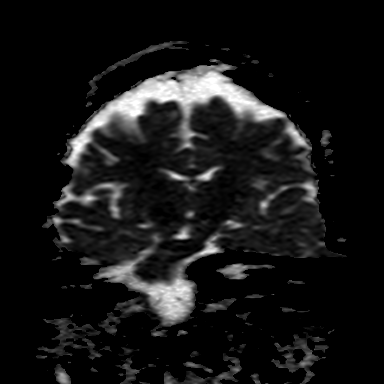
[im 28/38]
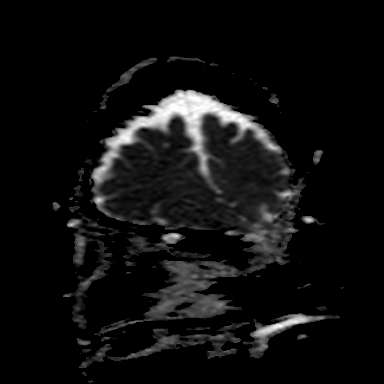
[im 38/38]
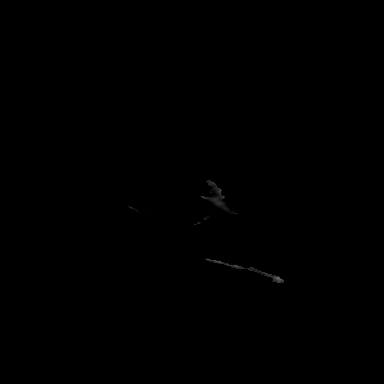

[Series 8: T2 · axial · 5.0mm · 0.46mm/px · z∈[-72,+64]mm · 3 of 22 slices shown]
[im 1/22]
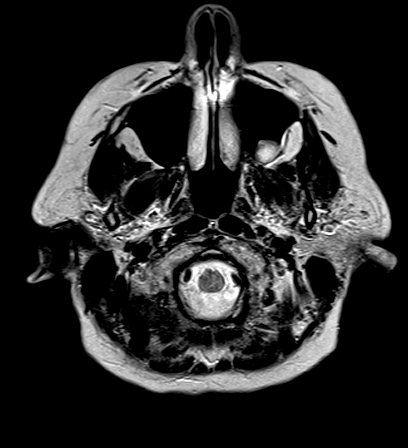
[im 11/22]
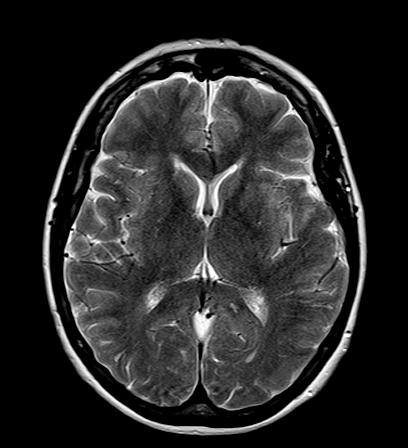
[im 22/22]
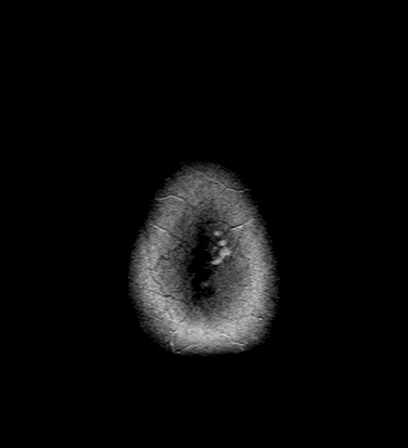

[Series 9: FLAIR · axial · 3.0mm · 0.32mm/px · z∈[-118,+34]mm · 6 of 52 slices shown]
[im 1/52]
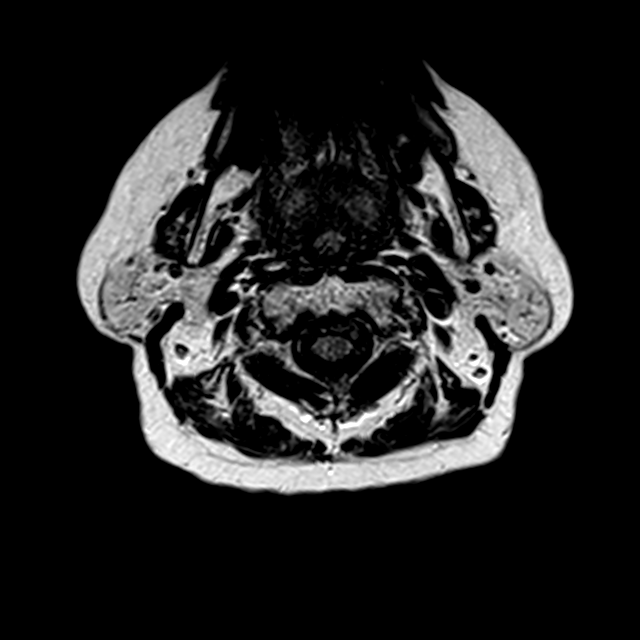
[im 11/52]
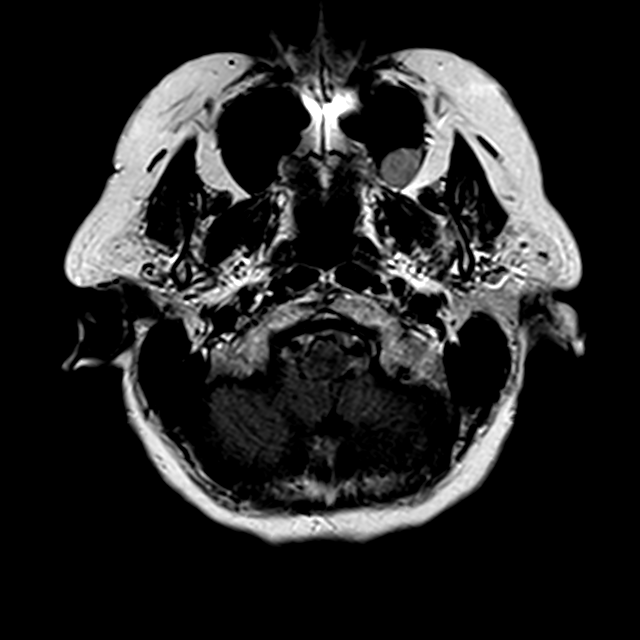
[im 21/52]
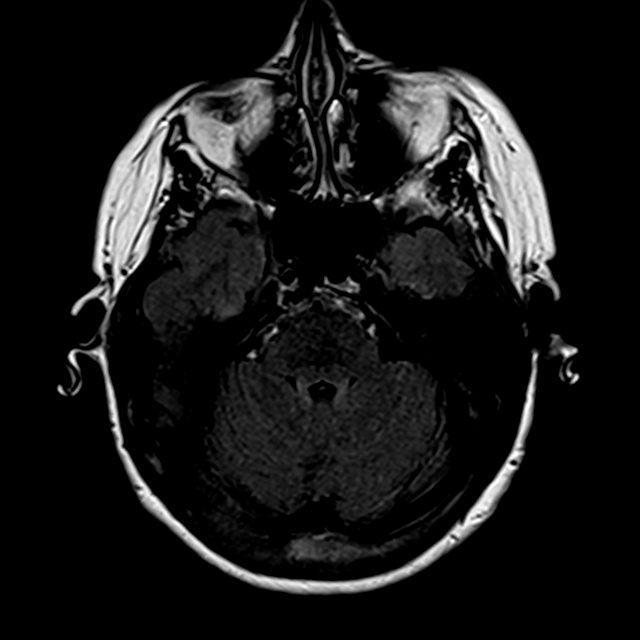
[im 31/52]
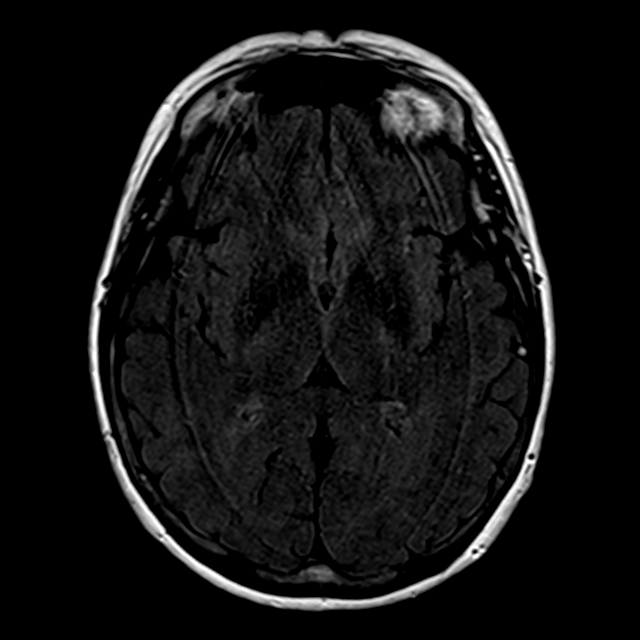
[im 41/52]
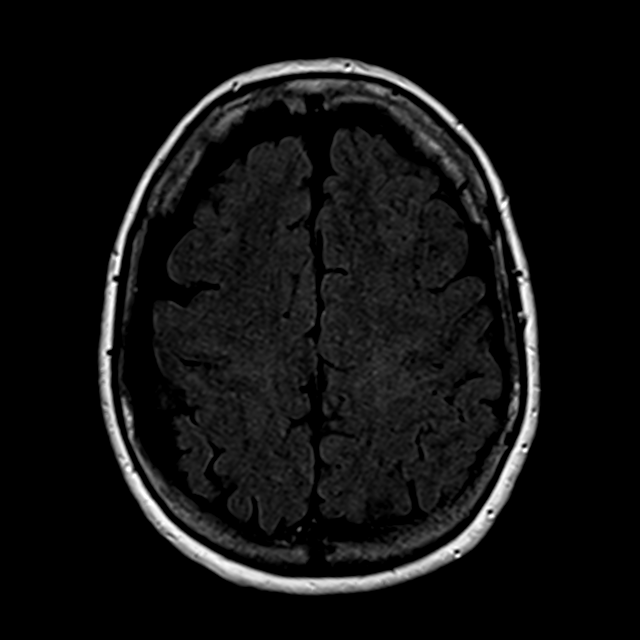
[im 52/52]
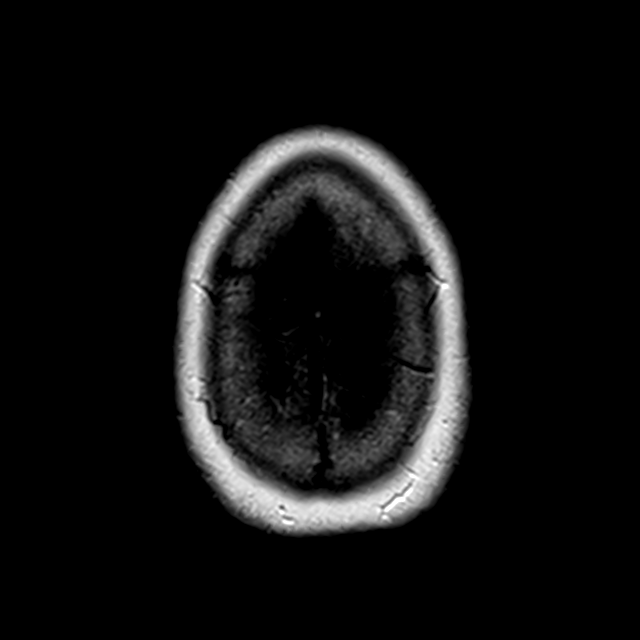

[30 of 48 positions shown; findings below may reference images not displayed]

FINDINGS: Brain: No explanation for symptoms. Normal volume and signal of the
cerebellum and brainstem. No infarction, hemorrhage, hydrocephalus,
extra-axial collection or mass lesion. Minimal FLAIR hyperintensity
around the frontal horns and at the left cingulate gyrus.

Vascular: Major flow voids are preserved.

Skull and upper cervical spine: No evidence of marrow lesion.

Sinuses/Orbits: Bilateral enucleation. I did not find indication for
enucleation in the EMR. Small outpouching of the right sphenoid
sinus into the right clivus, partially opacified and stable from
September 2016 maxillofacial CT.
IMPRESSION: No explanation for the history. Normal appearance of the cerebellum
and brainstem.

## 2018-08-01 ENCOUNTER — Other Ambulatory Visit: Payer: Self-pay | Admitting: Family Medicine

## 2018-08-01 DIAGNOSIS — I10 Essential (primary) hypertension: Secondary | ICD-10-CM

## 2018-08-09 DIAGNOSIS — Z79899 Other long term (current) drug therapy: Secondary | ICD-10-CM | POA: Diagnosis not present

## 2018-08-11 DIAGNOSIS — M2550 Pain in unspecified joint: Secondary | ICD-10-CM | POA: Diagnosis not present

## 2018-08-11 DIAGNOSIS — R29818 Other symptoms and signs involving the nervous system: Secondary | ICD-10-CM | POA: Diagnosis not present

## 2018-08-11 DIAGNOSIS — E669 Obesity, unspecified: Secondary | ICD-10-CM | POA: Diagnosis not present

## 2018-08-11 DIAGNOSIS — M25561 Pain in right knee: Secondary | ICD-10-CM | POA: Diagnosis not present

## 2018-08-11 DIAGNOSIS — M359 Systemic involvement of connective tissue, unspecified: Secondary | ICD-10-CM | POA: Diagnosis not present

## 2018-08-11 DIAGNOSIS — R21 Rash and other nonspecific skin eruption: Secondary | ICD-10-CM | POA: Diagnosis not present

## 2018-08-11 DIAGNOSIS — Z6830 Body mass index (BMI) 30.0-30.9, adult: Secondary | ICD-10-CM | POA: Diagnosis not present

## 2018-08-11 DIAGNOSIS — R05 Cough: Secondary | ICD-10-CM | POA: Diagnosis not present

## 2018-08-11 DIAGNOSIS — R768 Other specified abnormal immunological findings in serum: Secondary | ICD-10-CM | POA: Diagnosis not present

## 2018-08-12 ENCOUNTER — Telehealth: Payer: Self-pay | Admitting: Gastroenterology

## 2018-08-12 NOTE — Telephone Encounter (Signed)
Tried to call pt to confirm cancelling procedure. Spoke to pt's husband. He confirmed pt wants to cancel. She started steroids yesterday and doesn't feel safe right now d/t COVID cases rising. She will call office when she would like to proceed. Endo scheduler informed.  FYI to EG.

## 2018-08-12 NOTE — Telephone Encounter (Signed)
Pt wants to cancel her procedure with [facility] on 7/28 due to "the rates of covid going up" and her autoimmune system and being on steroids. She said when she feels comfortable with rescheduling she would call us back.

## 2018-08-12 NOTE — Telephone Encounter (Signed)
Noted  

## 2018-08-18 ENCOUNTER — Other Ambulatory Visit (HOSPITAL_COMMUNITY): Payer: Self-pay

## 2018-08-22 ENCOUNTER — Other Ambulatory Visit: Payer: Self-pay

## 2018-08-22 ENCOUNTER — Ambulatory Visit: Payer: Medicare Other | Admitting: Physician Assistant

## 2018-08-22 DIAGNOSIS — Z6830 Body mass index (BMI) 30.0-30.9, adult: Secondary | ICD-10-CM | POA: Diagnosis not present

## 2018-08-22 DIAGNOSIS — L739 Follicular disorder, unspecified: Secondary | ICD-10-CM | POA: Diagnosis not present

## 2018-08-23 ENCOUNTER — Ambulatory Visit (HOSPITAL_COMMUNITY): Admission: RE | Admit: 2018-08-23 | Payer: Medicare Other | Source: Home / Self Care | Admitting: Gastroenterology

## 2018-08-23 ENCOUNTER — Encounter (HOSPITAL_COMMUNITY): Admission: RE | Payer: Self-pay | Source: Home / Self Care

## 2018-08-23 SURGERY — COLONOSCOPY WITH PROPOFOL
Anesthesia: Monitor Anesthesia Care

## 2018-08-30 NOTE — Progress Notes (Signed)
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  She had recent MRI of the brain that was unremarkable.  After her last visit her carbamazepine dose was reduced to 200 mg daily.  Laboratory evaluation revealed a low sodium level of 133. She has been referred to a pain center for chronic low back pain. She has not had any seizure type events for 16 years. Her seizure events in the past were described as staring off events, she would drop things. Her walking is stable. She needs a knee replacement. She denies any falls. She has artifical eyes. She had lab work 04/05/2018 her sodium 140.  She presents today for follow-up accompanied by her husband.  HISTORY 03/02/2018 Dr. Jannifer Franklin: Erica Bennett is a 73 year old white female with a history of seizure-like events, she has mixed connective tissue disease, she is on Plaquenil for this.  Over the last 3 weeks she has felt poorly, she has had increased fatigue and malaise.  She has had difficulty with balance that has been persistent, MRI of the brain was done after her last revisit, this was unremarkable.  The patient was placed on gabapentin for some of her joint discomfort, this resulted in increased gait instability, she fortunately has not had any falls.  She has significant degenerative arthritis affecting the right knee and may require surgery in the future.  The patient has been referred to a pain center, [facility].  She has chronic low back pain.  She returns for evaluation.  The patient has not had any seizure type events in many years.  REVIEW OF SYSTEMS: Out of a complete 14 system review of symptoms, the patient complains only of the following symptoms, and all other reviewed systems are  negative.  Seizure  ALLERGIES: Allergies  Allergen Reactions   Bacitracin Rash   Neomycin Rash   Sulfa Antibiotics Swelling and Rash   Sulfamethoxazole Other (See Comments) and Swelling   Elavil [Amitriptyline Hcl] Other (See Comments)    Really bad tremors   Gabapentin Other (See Comments)    Balance issues   Prozac [Fluoxetine Hcl]     Severe headache    HOME MEDICATIONS: Outpatient Medications Prior to Visit  Medication Sig Dispense Refill   ALPRAZolam (XANAX) 1 MG tablet Take 1 tablet (1 mg total) by mouth at bedtime. 30 tablet 2   CARBATROL 200 MG 12 hr capsule Take 1 capsule (200 mg total) by mouth daily. (Patient taking differently: Take 200 mg by mouth at bedtime. ) 180 capsule 1   DEXILANT 60 MG capsule TAKE ONE CAPSULE BY MOUTH DAILY; ON AN EMPTY STOMACH, THEN DO NOT EAT FOR ONE HOUR. (Patient taking differently: Take 60 mg by mouth daily before breakfast. ) 30 capsule 5   ECHINACEA PO Take 1,520 mg by mouth daily. 760 mg per capsule     HYDROcodone-acetaminophen (NORCO/VICODIN) 5-325 MG tablet Take 1 tablet by mouth 2 (two) times daily as needed (pain.).      hydroxychloroquine (PLAQUENIL) 200 MG tablet Take 1 tablet (200 mg total) by mouth 2 (two) times daily. 180 tablet 1   MAXALT 10 MG tablet Take 1 tablet (10 mg total) by mouth as needed. (Patient taking differently: Take 10 mg by mouth 3 (three) times daily as needed for migraine. ) 10  tablet 5   metoprolol succinate (TOPROL-XL) 100 MG 24 hr tablet Take 1 tablet (100 mg total) by mouth every evening. 90 tablet 0   Na Sulfate-K Sulfate-Mg Sulf 17.5-3.13-1.6 GM/177ML SOLN Take 1 kit by mouth as directed. 1 Bottle 0   NARCAN 4 MG/0.1ML LIQD nasal spray kit Place 1 spray into the nose as needed (accidental overdose.).      prednisoLONE acetate (PRED FORTE) 1 % ophthalmic suspension Place 1 drop into both eyes 2 (two) times daily.      Probiotic Product (PROBIOTIC DAILY PO) Take 1 capsule by mouth daily.  Renew Life Ultimate Flora Probiotic 1 tablet (150 billion) live cultures      No facility-administered medications prior to visit.     PAST MEDICAL HISTORY: Past Medical History:  Diagnosis Date   Blindness of both eyes    Elevated cholesterol    GERD (gastroesophageal reflux disease)    Headache    Hypertension    Seizures (HCC)    Skin cancer     PAST SURGICAL HISTORY: Past Surgical History:  Procedure Laterality Date   ABDOMINAL HYSTERECTOMY     Bone spur removed from shoulder     CHOLECYSTECTOMY     ENUCLEATION Bilateral    TONSILLECTOMY      FAMILY HISTORY: Family History  Problem Relation Age of Onset   Depression Paternal Aunt    Alcohol abuse Paternal Uncle    Drug abuse Paternal Uncle    Depression Cousin    Depression Maternal Aunt    Colon cancer Mother    Colon cancer Maternal Aunt    Colon cancer Other     SOCIAL HISTORY: Social History   Socioeconomic History   Marital status: Married    Spouse name: Not on file   Number of children: 0   Years of education: 18   Highest education level: Not on file  Occupational History   Occupation: Retired  Scientist, product/process development strain: Not on file   Food insecurity    Worry: Not on file    Inability: Not on Lexicographer needs    Medical: Not on file    Non-medical: Not on file  Tobacco Use   Smoking status: Former Smoker   Smokeless tobacco: Never Used   Tobacco comment: Quit 23 years ago  Substance and Sexual Activity   Alcohol use: No    Alcohol/week: 0.0 standard drinks   Drug use: No   Sexual activity: Yes    Birth control/protection: Surgical  Lifestyle   Physical activity    Days per week: Not on file    Minutes per session: Not on file   Stress: Not on file  Relationships   Social connections    Talks on phone: Not on file    Gets together: Not on file    Attends religious service: Not on file    Active member of club or  organization: Not on file    Attends meetings of clubs or organizations: Not on file    Relationship status: Not on file   Intimate partner violence    Fear of current or ex partner: Not on file    Emotionally abused: Not on file    Physically abused: Not on file    Forced sexual activity: Not on file  Other Topics Concern   Not on file  Social History Narrative   POA-Dennis   Caffeine use: daily (1 soda per day, 1 coffee per  day)   Left handed    Legally blind      Worked as LPN/RN for about 8 years    PHYSICAL EXAM  There were no vitals filed for this visit. There is no height or weight on file to calculate BMI.  Generalized: Well developed, in no acute distress   Neurological examination  Mentation: Alert oriented to time, place, history taking. Follows all commands speech and language fluent Cranial nerve II-XII: She has artificial eyes.  Facial sensation and strength were normal.  Head turning and shoulder shrug  were normal and symmetric. Motor: The motor testing reveals 5 over 5 strength of all 4 extremities. Good symmetric motor tone is noted throughout.  Sensory: Sensory testing is intact to soft touch on all 4 extremities. No evidence of extinction is noted.  Coordination: Cerebellar testing reveals good heel-to-shin bilaterally.  Gait and station: Gait is unsteady, uses a guide stick, she is blind Reflexes: Deep tendon reflexes are symmetric and normal bilaterally.   DIAGNOSTIC DATA (LABS, IMAGING, TESTING) - I reviewed patient records, labs, notes, testing and imaging myself where available.  Lab Results  Component Value Date   WBC 9.5 03/02/2018   HGB 14.2 03/02/2018   HCT 41.0 03/02/2018   MCV 84 03/02/2018   PLT 312 03/02/2018      Component Value Date/Time   NA 133 (L) 03/02/2018 1555   K 4.1 03/02/2018 1555   CL 95 (L) 03/02/2018 1555   CO2 21 03/02/2018 1555   GLUCOSE 88 03/02/2018 1555   BUN 10 03/02/2018 1555   CREATININE 0.70 03/02/2018  1555   CALCIUM 9.7 03/02/2018 1555   PROT 7.1 03/02/2018 1555   ALBUMIN 4.7 03/02/2018 1555   AST 12 03/02/2018 1555   ALT 16 03/02/2018 1555   ALKPHOS 115 03/02/2018 1555   BILITOT 0.2 03/02/2018 1555   GFRNONAA 88 03/02/2018 1555   GFRAA 102 03/02/2018 1555   Lab Results  Component Value Date   CHOL 203 (H) 11/18/2017   HDL 51 11/18/2017   LDLCALC 107 (H) 11/18/2017   TRIG 227 (H) 11/18/2017   CHOLHDL 4.0 11/18/2017   No results found for: HGBA1C Lab Results  Component Value Date   VITAMINB12 542 03/02/2018   Lab Results  Component Value Date   TSH 2.940 03/02/2018    ASSESSMENT AND PLAN 73 y.o. year old female  has a past medical history of Blindness of both eyes, Elevated cholesterol, GERD (gastroesophageal reflux disease), Headache, Hypertension, Seizures ([place]), and Skin cancer. here with:  1.  Seizure like events  She has not had a seizure event in many years.  After her last visit in February 2020 her dose of carbamazepine was decreased to 200 mg daily.  She was feeling quite poorly at that time with fatigue, malaise, her sodium level was found to be low at 133.  The carbamazepine level was within normal limits at 6.8 while taking 200 mg twice daily on 03/02/2018.   She has done quite well with the dose adjustment.  She has not had recurrent seizure event.  She did have her sodium level rechecked 04/05/2018 at her pain management doctor, the level was 140.  She will continue taking carbamazepine 200 mg daily.  We will recheck blood work at her next visit.  She remains on Plaquenil for connective tissue disease. She will follow-up in 6 months or sooner if needed.  I advised that if her symptoms worsen or she develops any new symptoms she should let  us know.   I spent 15 minutes with the patient. 50% of this time was spent discussing her plan of care.   Butler Denmark, AGNP-C, DNP 08/31/2018, 2:14 PM [facility] [address], [place], [place]  [address] [phone]

## 2018-08-31 ENCOUNTER — Other Ambulatory Visit: Payer: Self-pay

## 2018-08-31 ENCOUNTER — Encounter: Payer: Self-pay | Admitting: Neurology

## 2018-08-31 ENCOUNTER — Ambulatory Visit (INDEPENDENT_AMBULATORY_CARE_PROVIDER_SITE_OTHER): Payer: Medicare Other | Admitting: Neurology

## 2018-08-31 DIAGNOSIS — R569 Unspecified convulsions: Secondary | ICD-10-CM | POA: Diagnosis not present

## 2018-08-31 DIAGNOSIS — G40409 Other generalized epilepsy and epileptic syndromes, not intractable, without status epilepticus: Secondary | ICD-10-CM

## 2018-08-31 MED ORDER — CARBATROL 200 MG PO CP12: 200.0000 mg | ORAL_CAPSULE | Freq: Every day | ORAL | 1 refills | Status: DC

## 2018-08-31 NOTE — Progress Notes (Signed)
I have read the note, and I agree with the clinical assessment and plan.  Chaddrick Brue K Marchello Rothgeb   

## 2018-08-31 NOTE — Patient Instructions (Signed)
Please continue taking Carbamazepine at current dose once daily. We will see you in 6 months. At that time we will check lab work.

## 2018-09-01 DIAGNOSIS — Z79899 Other long term (current) drug therapy: Secondary | ICD-10-CM | POA: Diagnosis not present

## 2018-09-01 DIAGNOSIS — G894 Chronic pain syndrome: Secondary | ICD-10-CM | POA: Diagnosis not present

## 2018-09-01 DIAGNOSIS — I10 Essential (primary) hypertension: Secondary | ICD-10-CM | POA: Diagnosis not present

## 2018-09-01 DIAGNOSIS — M359 Systemic involvement of connective tissue, unspecified: Secondary | ICD-10-CM | POA: Diagnosis not present

## 2018-09-01 DIAGNOSIS — Z760 Encounter for issue of repeat prescription: Secondary | ICD-10-CM | POA: Diagnosis not present

## 2018-09-08 DIAGNOSIS — Z131 Encounter for screening for diabetes mellitus: Secondary | ICD-10-CM | POA: Diagnosis not present

## 2018-09-08 DIAGNOSIS — E559 Vitamin D deficiency, unspecified: Secondary | ICD-10-CM | POA: Diagnosis not present

## 2018-09-08 DIAGNOSIS — R1084 Generalized abdominal pain: Secondary | ICD-10-CM | POA: Diagnosis not present

## 2018-09-08 DIAGNOSIS — E010 Iodine-deficiency related diffuse (endemic) goiter: Secondary | ICD-10-CM | POA: Diagnosis not present

## 2018-09-08 DIAGNOSIS — I10 Essential (primary) hypertension: Secondary | ICD-10-CM | POA: Diagnosis not present

## 2018-09-08 DIAGNOSIS — E7800 Pure hypercholesterolemia, unspecified: Secondary | ICD-10-CM | POA: Diagnosis not present

## 2018-09-08 DIAGNOSIS — Z1159 Encounter for screening for other viral diseases: Secondary | ICD-10-CM | POA: Diagnosis not present

## 2018-09-08 DIAGNOSIS — Z1389 Encounter for screening for other disorder: Secondary | ICD-10-CM | POA: Diagnosis not present

## 2018-09-12 ENCOUNTER — Ambulatory Visit (INDEPENDENT_AMBULATORY_CARE_PROVIDER_SITE_OTHER): Payer: Medicare Other | Admitting: Otolaryngology

## 2018-09-12 DIAGNOSIS — D440 Neoplasm of uncertain behavior of thyroid gland: Secondary | ICD-10-CM

## 2018-09-12 DIAGNOSIS — R1312 Dysphagia, oropharyngeal phase: Secondary | ICD-10-CM | POA: Diagnosis not present

## 2018-09-12 DIAGNOSIS — R490 Dysphonia: Secondary | ICD-10-CM

## 2018-09-16 ENCOUNTER — Ambulatory Visit: Payer: Medicare Other | Admitting: Family Medicine

## 2018-09-21 ENCOUNTER — Ambulatory Visit: Payer: Medicare Other

## 2018-09-23 ENCOUNTER — Telehealth (HOSPITAL_COMMUNITY): Payer: Self-pay | Admitting: *Deleted

## 2018-09-23 NOTE — Telephone Encounter (Signed)
PATIENT CALLED WANTING TO BE SURE TO SHARE WITH DR ROSS THAT THE : hydroxychloroquine (PLAQUENIL) 200 MG tablet. HAS MADE A WORLD OF DIFFERENCE & SHE'S FEELING BETTER THE INFLAMMATION & PAIN HAS SUBSIDED

## 2018-09-23 NOTE — Telephone Encounter (Signed)
That's good to know, but i'm not the MD prescribing this

## 2018-09-26 ENCOUNTER — Other Ambulatory Visit: Payer: Self-pay | Admitting: Otolaryngology

## 2018-09-30 DIAGNOSIS — R1084 Generalized abdominal pain: Secondary | ICD-10-CM | POA: Diagnosis not present

## 2018-10-05 DIAGNOSIS — Z79899 Other long term (current) drug therapy: Secondary | ICD-10-CM | POA: Diagnosis not present

## 2018-10-05 DIAGNOSIS — G894 Chronic pain syndrome: Secondary | ICD-10-CM | POA: Diagnosis not present

## 2018-10-05 DIAGNOSIS — M359 Systemic involvement of connective tissue, unspecified: Secondary | ICD-10-CM | POA: Diagnosis not present

## 2018-10-05 DIAGNOSIS — Z760 Encounter for issue of repeat prescription: Secondary | ICD-10-CM | POA: Diagnosis not present

## 2018-10-06 DIAGNOSIS — R0781 Pleurodynia: Secondary | ICD-10-CM | POA: Diagnosis not present

## 2018-10-06 DIAGNOSIS — L918 Other hypertrophic disorders of the skin: Secondary | ICD-10-CM | POA: Diagnosis not present

## 2018-10-06 DIAGNOSIS — H5789 Other specified disorders of eye and adnexa: Secondary | ICD-10-CM | POA: Diagnosis not present

## 2018-10-06 DIAGNOSIS — H543 Unqualified visual loss, both eyes: Secondary | ICD-10-CM | POA: Diagnosis not present

## 2018-10-06 DIAGNOSIS — E559 Vitamin D deficiency, unspecified: Secondary | ICD-10-CM | POA: Diagnosis not present

## 2018-10-11 DIAGNOSIS — L821 Other seborrheic keratosis: Secondary | ICD-10-CM | POA: Diagnosis not present

## 2018-10-25 ENCOUNTER — Other Ambulatory Visit (HOSPITAL_COMMUNITY): Payer: Self-pay | Admitting: Psychiatry

## 2018-10-25 ENCOUNTER — Telehealth (HOSPITAL_COMMUNITY): Payer: Self-pay | Admitting: *Deleted

## 2018-10-25 NOTE — Telephone Encounter (Signed)
PATIENT CALLED TO SAY THAT SHE HAS 3 XANAX LEFT. AND HER APPT IS 09/05/2018 & SHE WILL BE OUT DUE TO HAVING MAKE A SCHEDULE CHANGE DUE TO PROVIDER SCHEDULE. PATIENT ALSO MENTIONED THAT  09/07/2018 SHE WILL HAVING THYROID SURGERY @ [facility].

## 2018-10-25 NOTE — Telephone Encounter (Signed)
I sent it in today , pharmacy requested

## 2018-10-31 ENCOUNTER — Encounter (HOSPITAL_BASED_OUTPATIENT_CLINIC_OR_DEPARTMENT_OTHER): Payer: Self-pay | Admitting: *Deleted

## 2018-10-31 ENCOUNTER — Other Ambulatory Visit: Payer: Self-pay

## 2018-10-31 DIAGNOSIS — M359 Systemic involvement of connective tissue, unspecified: Secondary | ICD-10-CM | POA: Diagnosis not present

## 2018-10-31 DIAGNOSIS — G894 Chronic pain syndrome: Secondary | ICD-10-CM | POA: Diagnosis not present

## 2018-10-31 DIAGNOSIS — Z79899 Other long term (current) drug therapy: Secondary | ICD-10-CM | POA: Diagnosis not present

## 2018-11-02 DIAGNOSIS — H60332 Swimmer's ear, left ear: Secondary | ICD-10-CM | POA: Diagnosis not present

## 2018-11-03 ENCOUNTER — Other Ambulatory Visit (HOSPITAL_COMMUNITY)
Admission: RE | Admit: 2018-11-03 | Discharge: 2018-11-03 | Disposition: A | Discharge status: Home / Self Care | Payer: Medicare Other | Source: Ambulatory Visit | Attending: Otolaryngology | Admitting: Otolaryngology

## 2018-11-03 ENCOUNTER — Other Ambulatory Visit: Payer: Self-pay | Admitting: Family Medicine

## 2018-11-03 ENCOUNTER — Ambulatory Visit (INDEPENDENT_AMBULATORY_CARE_PROVIDER_SITE_OTHER): Payer: Medicare Other | Admitting: Psychiatry

## 2018-11-03 ENCOUNTER — Encounter (HOSPITAL_COMMUNITY): Payer: Self-pay | Admitting: Psychiatry

## 2018-11-03 ENCOUNTER — Other Ambulatory Visit: Payer: Self-pay

## 2018-11-03 ENCOUNTER — Other Ambulatory Visit (HOSPITAL_COMMUNITY): Payer: Medicare Other

## 2018-11-03 DIAGNOSIS — Z01812 Encounter for preprocedural laboratory examination: Secondary | ICD-10-CM | POA: Diagnosis not present

## 2018-11-03 DIAGNOSIS — Z20828 Contact with and (suspected) exposure to other viral communicable diseases: Secondary | ICD-10-CM | POA: Insufficient documentation

## 2018-11-03 DIAGNOSIS — F321 Major depressive disorder, single episode, moderate: Secondary | ICD-10-CM

## 2018-11-03 LAB — SARS CORONAVIRUS 2 (TAT 6-24 HRS): SARS Coronavirus 2: NEGATIVE

## 2018-11-03 MED ORDER — ALPRAZOLAM 1 MG PO TABS: 1.0000 mg | ORAL_TABLET | Freq: Every day | ORAL | 3 refills | Status: DC

## 2018-11-03 NOTE — Progress Notes (Signed)
Virtual Visit via Telephone Note  I connected with Erica Bennett on 11/03/18 at  2:20 PM EDT by telephone and verified that I am speaking with the correct person using two identifiers.   I discussed the limitations, risks, security and privacy concerns of performing an evaluation and management service by telephone and the availability of in person appointments. I also discussed with the patient that there may be a patient responsible charge related to this service. The patient expressed understanding and agreed to proceed.     I discussed the assessment and treatment plan with the patient. The patient was provided an opportunity to ask questions and all were answered. The patient agreed with the plan and demonstrated an understanding of the instructions.   The patient was advised to call back or seek an in-person evaluation if the symptoms worsen or if the condition fails to improve as anticipated.  I provided 15 minutes of non-face-to-face time during this encounter.   [name], MD  [facility] MD/PA/NP OP Progress Note  11/03/2018 2:35 PM Erica Bennett  MRN:  [id number]  Chief Complaint:  Chief Complaint    Anxiety; Follow-up     HPI: this patient is a73 year old married white female who lives with her husband in [place]. They've been married for 14years. She has no children. She is on disability for congenital blindness.  The patient was referred by Dr.Hosanji, her primary physician, for further assessment of depression anxiety.  The patient states that her mother contracted rubella when she was pregnant with her. She developed congenital blindness that worsened in her 73s with severe macular degeneration. By her 73s she was totally blind. 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  She has learned Braille  and gets help through the [employer]. She was very comfortable doing things for herself. Both her parents are deceased and she doesn't have any other family. 11 years ago she reconnected with a man that she grown up with all her life. He had a history of alcoholism but claimed he had been sober for 5 years. They got married initially it went okay but then he began drinking. He goes through alcoholic bouts that can last months to years and he usually drinks about a half gallon of vodka a day. He last went through treatment last June but around March of this year he started drinking again. She felt very taken advantage of that she can't see what he is doing but she has been finding the bottles in confronting him and he is been lying about it. This is made her very uncomfortable. He is her power of attorney and they share a bank account. They've been arguing a lot more and she is become increasingly anxious and somewhat depressed.  The patient did have some prior treatment in her 73s when she went blind. She saw psychiatrist to help deal with the change and also took Paxil for a while. She's not had any treatment since. She's very active in Al-Anon and has several good friends from the program. She knows that she should detach from her alcoholic husband but it's difficult to do so because of her dependency on him.  The patient has always had difficulty sleeping because of the circadian problem with being blind. She has tried [place] and Ambien but they  didn't help and the newer drugs for this are extremely expensive. She is on a low dose of Xanax which is helped a little bit but she only gets about 3-4 hours of sleep a night. She is tired through the day, her energy is low. She denies suicidal ideation. She enjoys doing things with friends but will often walk him around because of her husbands behavior. She herself does not drink or use drugs and she's never had psychotic symptoms  The patient  returns for follow-up after 4 months.  For the most part she has been doing well.  She does have a lot of pain from her connective tissue disorder but brand-name Plaquenil has been helping.  She been spending most of her time reading [place] listening to TV with her husband.  She states that they are not bored and getting along very well.  We had switched to Xanax at bedtime to help with sleep and it is going well for her.  She does not feel depressed or have any thoughts of suicide.  Visit Diagnosis:    ICD-10-CM   1. Moderate single current episode of major depressive disorder (HCC)  F32.1     Past Psychiatric History: Outpatient treatment for depression  Past Medical History:  Past Medical History:  Diagnosis Date  . Anxiety   . Blindness of both eyes   . Complication of anesthesia   . Depression   . Elevated cholesterol   . GERD (gastroesophageal reflux disease)   . Headache   . Hypertension   . PONV (postoperative nausea and vomiting)   . Seizures ([place])    no seizures in over 10 yrs  . Skin cancer   . Sleep apnea    does not use CPAP  . Thyroid mass    left    Past Surgical History:  Procedure Laterality Date  . ABDOMINAL HYSTERECTOMY    . Bone spur removed from shoulder    . CHOLECYSTECTOMY    . ENUCLEATION Bilateral   . TONSILLECTOMY      Family Psychiatric History: see below  Family History:  Family History  Problem Relation Age of Onset  . Depression Paternal Aunt   . Alcohol abuse Paternal Uncle   . Drug abuse Paternal Uncle   . Depression Cousin   . Depression Maternal Aunt   . Colon cancer Mother   . Colon cancer Maternal Aunt   . Colon cancer Other     Social History:  Social History   Socioeconomic History  . Marital status: Married    Spouse name: Not on file  . Number of children: 0  . Years of education: 53  . Highest education level: Not on file  Occupational History  . Occupation: Retired  Scientific laboratory technician  . Financial resource strain:  Not on file  . Food insecurity    Worry: Not on file    Inability: Not on file  . Transportation needs    Medical: Not on file    Non-medical: Not on file  Tobacco Use  . Smoking status: Former Research scientist (life sciences)  . Smokeless tobacco: Never Used  . Tobacco comment: Quit 23 years ago  Substance and Sexual Activity  . Alcohol use: No    Alcohol/week: 0.0 standard drinks  . Drug use: No  . Sexual activity: Yes    Birth control/protection: Surgical  Lifestyle  . Physical activity    Days per week: Not on file    Minutes per session: Not on file  .  Stress: Not on file  Relationships  . Social Herbalist on phone: Not on file    Gets together: Not on file    Attends religious service: Not on file    Active member of club or organization: Not on file    Attends meetings of clubs or organizations: Not on file    Relationship status: Not on file  Other Topics Concern  . Not on file  Social History Narrative   [employer]   Caffeine use: daily (1 soda per day, 1 coffee per day)   Left handed    Legally blind      Worked as LPN/RN for about 8 years    Allergies:  Allergies  Allergen Reactions  . Bacitracin Rash  . Neomycin Rash  . Sulfa Antibiotics Swelling and Rash  . Sulfamethoxazole Other (See Comments) and Swelling  . Eggs Or Egg-Derived Products Diarrhea    Stomach cramps  . Elavil [Amitriptyline Hcl] Other (See Comments)    Really bad tremors  . Gabapentin Other (See Comments)    Balance issues  . Prozac [Fluoxetine Hcl]     Severe headache    Metabolic Disorder Labs: No results found for: HGBA1C, MPG No results found for: PROLACTIN Lab Results  Component Value Date   CHOL 203 (H) 11/18/2017   TRIG 227 (H) 11/18/2017   HDL 51 11/18/2017   CHOLHDL 4.0 11/18/2017   LDLCALC 107 (H) 11/18/2017   Lab Results  Component Value Date   TSH 2.940 03/02/2018   TSH 1.56 10/26/2017    Therapeutic Level Labs: No results found for: LITHIUM No results found for:  VALPROATE No components found for:  CBMZ  Current Medications: Current Outpatient Medications  Medication Sig Dispense Refill  . ALPRAZolam (XANAX) 1 MG tablet Take 1 tablet (1 mg total) by mouth at bedtime. 30 tablet 3  . CARBATROL 200 MG 12 hr capsule Take 1 capsule (200 mg total) by mouth daily. 90 capsule 1  . DEXILANT 60 MG capsule TAKE ONE CAPSULE BY MOUTH DAILY; ON AN EMPTY STOMACH, THEN DO NOT EAT FOR ONE HOUR. (Patient taking differently: Take 60 mg by mouth daily before breakfast. ) 30 capsule 5  . ECHINACEA PO Take 1,520 mg by mouth daily. 760 mg per capsule    . HYDROcodone-acetaminophen (NORCO/VICODIN) 5-325 MG tablet Take 1 tablet by mouth 2 (two) times daily as needed (pain.).     Marland Kitchen hydroxychloroquine (PLAQUENIL) 200 MG tablet Take 1 tablet (200 mg total) by mouth 2 (two) times daily. 180 tablet 1  . MAXALT 10 MG tablet Take 1 tablet (10 mg total) by mouth as needed. (Patient taking differently: Take 10 mg by mouth 3 (three) times daily as needed for migraine. ) 10 tablet 5  . metoprolol succinate (TOPROL-XL) 100 MG 24 hr tablet Take 1 tablet (100 mg total) by mouth every evening. 90 tablet 0  . NARCAN 4 MG/0.1ML LIQD nasal spray kit Place 1 spray into the nose as needed (accidental overdose.).     Marland Kitchen prednisoLONE acetate (PRED FORTE) 1 % ophthalmic suspension Place 1 drop into both eyes 2 (two) times daily.      No current facility-administered medications for this visit.      Musculoskeletal: Strength & Muscle Tone: decreased Gait & Station: normal Patient leans: N/A  Psychiatric Specialty Exam: Review of Systems  Musculoskeletal: Positive for back pain, joint pain and myalgias.  All other systems reviewed and are negative.   There were no  vitals taken for this visit.There is no height or weight on file to calculate BMI.  General Appearance: NA  Eye Contact:  Absent  Speech:  Clear and Coherent  Volume:  Normal  Mood:  Euthymic  Affect:  NA  Thought Process:   Goal Directed  Orientation:  Full (Time, Place, and Person)  Thought Content: WDL   Suicidal Thoughts:  No  Homicidal Thoughts:  No  Memory:  Immediate;   Good Recent;   Good Remote;   Good  Judgement:  Good  Insight:  Good  Psychomotor Activity:  Decreased  Concentration:  Concentration: Good and Attention Span: Good  Recall:  Good  Fund of Knowledge: Good  Language: Good  Akathisia:  No  Handed:  Right  AIMS (if indicated): not done  Assets:  Communication Skills Desire for Improvement Resilience Social Support Talents/Skills  ADL's:  Intact  Cognition: WNL  Sleep:  Good   Screenings: PHQ2-9     Office Visit from 03/18/2018 in [facility] Visit from 02/16/2018 in [place] Visit from 11/16/2017 in [place]  PHQ-2 Total Score  3  3  0  PHQ-9 Total Score  11  12  -       Assessment and Plan: This patient is a 73 year old female with a history of congenital blindness, connective tissue disorder depression and anxiety.  She denies any depression currently.  She was having difficulty sleeping and Xanax 1 mg at bedtime seems to work for her.  We will continue this dosage and she will return to see me in 4 months   [name], MD 11/03/2018, 2:35 PM

## 2018-11-03 NOTE — Telephone Encounter (Signed)
Pharmacy filled 11/03/18. Refilled would be put on file. NTBS Stacks

## 2018-11-07 ENCOUNTER — Encounter (HOSPITAL_BASED_OUTPATIENT_CLINIC_OR_DEPARTMENT_OTHER): Payer: Self-pay

## 2018-11-07 ENCOUNTER — Ambulatory Visit (HOSPITAL_BASED_OUTPATIENT_CLINIC_OR_DEPARTMENT_OTHER)
Admission: RE | Admit: 2018-11-07 | Discharge: 2018-11-08 | Disposition: A | Discharge status: Home / Self Care | Payer: Medicare Other | Attending: Otolaryngology | Admitting: Otolaryngology

## 2018-11-07 ENCOUNTER — Ambulatory Visit (HOSPITAL_BASED_OUTPATIENT_CLINIC_OR_DEPARTMENT_OTHER): Payer: Medicare Other | Admitting: Anesthesiology

## 2018-11-07 ENCOUNTER — Other Ambulatory Visit: Payer: Self-pay

## 2018-11-07 ENCOUNTER — Encounter (HOSPITAL_BASED_OUTPATIENT_CLINIC_OR_DEPARTMENT_OTHER)
Admission: RE | Disposition: A | Discharge status: Home / Self Care | Payer: Self-pay | Source: Home / Self Care | Attending: Otolaryngology

## 2018-11-07 DIAGNOSIS — R1310 Dysphagia, unspecified: Secondary | ICD-10-CM | POA: Insufficient documentation

## 2018-11-07 DIAGNOSIS — E0789 Other specified disorders of thyroid: Secondary | ICD-10-CM | POA: Diagnosis not present

## 2018-11-07 DIAGNOSIS — Z888 Allergy status to other drugs, medicaments and biological substances status: Secondary | ICD-10-CM | POA: Insufficient documentation

## 2018-11-07 DIAGNOSIS — G4730 Sleep apnea, unspecified: Secondary | ICD-10-CM | POA: Diagnosis not present

## 2018-11-07 DIAGNOSIS — Z87891 Personal history of nicotine dependence: Secondary | ICD-10-CM | POA: Diagnosis not present

## 2018-11-07 DIAGNOSIS — E042 Nontoxic multinodular goiter: Secondary | ICD-10-CM | POA: Diagnosis not present

## 2018-11-07 DIAGNOSIS — Z882 Allergy status to sulfonamides status: Secondary | ICD-10-CM | POA: Diagnosis not present

## 2018-11-07 DIAGNOSIS — R569 Unspecified convulsions: Secondary | ICD-10-CM | POA: Diagnosis not present

## 2018-11-07 DIAGNOSIS — E079 Disorder of thyroid, unspecified: Secondary | ICD-10-CM | POA: Diagnosis not present

## 2018-11-07 DIAGNOSIS — Z9889 Other specified postprocedural states: Secondary | ICD-10-CM

## 2018-11-07 DIAGNOSIS — I10 Essential (primary) hypertension: Secondary | ICD-10-CM | POA: Insufficient documentation

## 2018-11-07 DIAGNOSIS — F419 Anxiety disorder, unspecified: Secondary | ICD-10-CM | POA: Diagnosis not present

## 2018-11-07 DIAGNOSIS — K219 Gastro-esophageal reflux disease without esophagitis: Secondary | ICD-10-CM | POA: Diagnosis not present

## 2018-11-07 DIAGNOSIS — E890 Postprocedural hypothyroidism: Secondary | ICD-10-CM

## 2018-11-07 DIAGNOSIS — H547 Unspecified visual loss: Secondary | ICD-10-CM | POA: Insufficient documentation

## 2018-11-07 HISTORY — DX: Other complications of anesthesia, initial encounter: T88.59XA

## 2018-11-07 HISTORY — DX: Other specified postprocedural states: Z98.890

## 2018-11-07 HISTORY — DX: Nausea with vomiting, unspecified: R11.2

## 2018-11-07 HISTORY — DX: Anxiety disorder, unspecified: F41.9

## 2018-11-07 HISTORY — PX: THYROIDECTOMY: SHX17

## 2018-11-07 SURGERY — THYROIDECTOMY
Anesthesia: General | Site: Throat | Laterality: Left

## 2018-11-07 MED ORDER — PROPOFOL 10 MG/ML IV BOLUS
INTRAVENOUS | Status: AC
  Filled 2018-11-07: qty 20

## 2018-11-07 MED ORDER — FENTANYL CITRATE (PF) 100 MCG/2ML IJ SOLN
25.0000 ug | INTRAMUSCULAR | Status: DC | PRN
  Administered 2018-11-07: 50 ug via INTRAVENOUS

## 2018-11-07 MED ORDER — FENTANYL CITRATE (PF) 100 MCG/2ML IJ SOLN
INTRAMUSCULAR | Status: AC
  Filled 2018-11-07: qty 2

## 2018-11-07 MED ORDER — METOPROLOL SUCCINATE ER 100 MG PO TB24
100.0000 mg | ORAL_TABLET | Freq: Every evening | ORAL | Status: DC
  Administered 2018-11-07: 100 mg via ORAL

## 2018-11-07 MED ORDER — CEFAZOLIN SODIUM-DEXTROSE 2-3 GM-%(50ML) IV SOLR
INTRAVENOUS | Status: DC | PRN
  Administered 2018-11-07: 2 g via INTRAVENOUS

## 2018-11-07 MED ORDER — ACETAMINOPHEN 160 MG/5ML PO SOLN: 650.0000 mg | ORAL | Status: DC | PRN

## 2018-11-07 MED ORDER — DEXAMETHASONE SODIUM PHOSPHATE 4 MG/ML IJ SOLN
INTRAMUSCULAR | Status: DC | PRN
  Administered 2018-11-07: 10 mg via INTRAVENOUS

## 2018-11-07 MED ORDER — AMOXICILLIN 875 MG PO TABS: 875.0000 mg | ORAL_TABLET | Freq: Two times a day (BID) | ORAL | 0 refills | Status: AC

## 2018-11-07 MED ORDER — ONDANSETRON HCL 4 MG/2ML IJ SOLN: 4.0000 mg | Freq: Once | INTRAMUSCULAR | Status: DC | PRN

## 2018-11-07 MED ORDER — OXYCODONE HCL 5 MG PO TABS: 5.0000 mg | ORAL_TABLET | Freq: Once | ORAL | Status: DC | PRN

## 2018-11-07 MED ORDER — ACETAMINOPHEN 650 MG RE SUPP: 650.0000 mg | RECTAL | Status: DC | PRN

## 2018-11-07 MED ORDER — HYDROXYCHLOROQUINE SULFATE 200 MG PO TABS
200.0000 mg | ORAL_TABLET | Freq: Two times a day (BID) | ORAL | Status: DC
  Administered 2018-11-07: 200 mg via ORAL

## 2018-11-07 MED ORDER — PROPOFOL 10 MG/ML IV BOLUS
INTRAVENOUS | Status: DC | PRN
  Administered 2018-11-07: 50 mg via INTRAVENOUS
  Administered 2018-11-07: 100 mg via INTRAVENOUS
  Administered 2018-11-07 (×2): 50 mg via INTRAVENOUS

## 2018-11-07 MED ORDER — MIDAZOLAM HCL 2 MG/2ML IJ SOLN: 1.0000 mg | INTRAMUSCULAR | Status: DC | PRN

## 2018-11-07 MED ORDER — DEXAMETHASONE SODIUM PHOSPHATE 10 MG/ML IJ SOLN
INTRAMUSCULAR | Status: AC
  Filled 2018-11-07: qty 1

## 2018-11-07 MED ORDER — EPHEDRINE SULFATE 50 MG/ML IJ SOLN
INTRAMUSCULAR | Status: DC | PRN
  Administered 2018-11-07: 10 mg via INTRAVENOUS

## 2018-11-07 MED ORDER — ALPRAZOLAM 1 MG PO TABS: 1.0000 mg | ORAL_TABLET | Freq: Every day | ORAL | Status: DC

## 2018-11-07 MED ORDER — FENTANYL CITRATE (PF) 100 MCG/2ML IJ SOLN
50.0000 ug | INTRAMUSCULAR | Status: DC | PRN
  Administered 2018-11-07: 100 ug via INTRAVENOUS

## 2018-11-07 MED ORDER — LIDOCAINE HCL (CARDIAC) PF 100 MG/5ML IV SOSY
PREFILLED_SYRINGE | INTRAVENOUS | Status: DC | PRN
  Administered 2018-11-07: 100 mg via INTRAVENOUS

## 2018-11-07 MED ORDER — MORPHINE SULFATE (PF) 4 MG/ML IV SOLN
2.0000 mg | INTRAVENOUS | Status: DC | PRN
  Administered 2018-11-08: 4 mg via INTRAVENOUS
  Filled 2018-11-07: qty 1

## 2018-11-07 MED ORDER — ONDANSETRON HCL 4 MG/2ML IJ SOLN
INTRAMUSCULAR | Status: AC
  Filled 2018-11-07: qty 2

## 2018-11-07 MED ORDER — PHENYLEPHRINE HCL (PRESSORS) 10 MG/ML IV SOLN
INTRAVENOUS | Status: DC | PRN
  Administered 2018-11-07: 80 ug via INTRAVENOUS
  Administered 2018-11-07: 120 ug via INTRAVENOUS

## 2018-11-07 MED ORDER — OXYCODONE HCL 5 MG/5ML PO SOLN: 5.0000 mg | Freq: Once | ORAL | Status: DC | PRN

## 2018-11-07 MED ORDER — SCOPOLAMINE 1 MG/3DAYS TD PT72: 1.0000 | MEDICATED_PATCH | Freq: Once | TRANSDERMAL | Status: DC

## 2018-11-07 MED ORDER — ONDANSETRON HCL 4 MG/2ML IJ SOLN
INTRAMUSCULAR | Status: DC | PRN
  Administered 2018-11-07: 4 mg via INTRAVENOUS

## 2018-11-07 MED ORDER — KCL IN DEXTROSE-NACL 20-5-0.45 MEQ/L-%-% IV SOLN
INTRAVENOUS | Status: DC
  Administered 2018-11-07: 12:00:00 via INTRAVENOUS
  Filled 2018-11-07: qty 1000

## 2018-11-07 MED ORDER — SUCCINYLCHOLINE CHLORIDE 200 MG/10ML IV SOSY
PREFILLED_SYRINGE | INTRAVENOUS | Status: AC
  Filled 2018-11-07: qty 10

## 2018-11-07 MED ORDER — LIDOCAINE-EPINEPHRINE 1 %-1:100000 IJ SOLN
INTRAMUSCULAR | Status: DC | PRN
  Administered 2018-11-07: 4 mL

## 2018-11-07 MED ORDER — OXYCODONE-ACETAMINOPHEN 5-325 MG PO TABS: 1.0000 | ORAL_TABLET | ORAL | 0 refills | Status: AC | PRN

## 2018-11-07 MED ORDER — SUCCINYLCHOLINE CHLORIDE 20 MG/ML IJ SOLN
INTRAMUSCULAR | Status: DC | PRN
  Administered 2018-11-07: 140 mg via INTRAVENOUS

## 2018-11-07 MED ORDER — LACTATED RINGERS IV SOLN
INTRAVENOUS | Status: DC
  Administered 2018-11-07: 09:00:00 via INTRAVENOUS

## 2018-11-07 MED ORDER — OXYCODONE-ACETAMINOPHEN 5-325 MG PO TABS
1.0000 | ORAL_TABLET | ORAL | Status: DC | PRN
  Administered 2018-11-07 (×3): 1 via ORAL
  Filled 2018-11-07 (×3): qty 1

## 2018-11-07 SURGICAL SUPPLY — 60 items
ATTRACTOMAT 16X20 MAGNETIC DRP (DRAPES) IMPLANT
BLADE CLIPPER SURG (BLADE) IMPLANT
BLADE SURG 10 STRL SS (BLADE) ×3 IMPLANT
BLADE SURG 15 STRL LF DISP TIS (BLADE) ×1 IMPLANT
BLADE SURG 15 STRL SS (BLADE) ×2
CANISTER SUCT 1200ML W/VALVE (MISCELLANEOUS) ×3 IMPLANT
CLIP VESOCCLUDE SM WIDE 6/CT (CLIP) IMPLANT
CORD BIPOLAR FORCEPS 12FT (ELECTRODE) ×3 IMPLANT
COVER BACK TABLE REUSABLE LG (DRAPES) ×3 IMPLANT
COVER MAYO STAND REUSABLE (DRAPES) ×3 IMPLANT
COVER WAND RF STERILE (DRAPES) IMPLANT
DECANTER SPIKE VIAL GLASS SM (MISCELLANEOUS) IMPLANT
DERMABOND ADVANCED (GAUZE/BANDAGES/DRESSINGS) ×2
DERMABOND ADVANCED .7 DNX12 (GAUZE/BANDAGES/DRESSINGS) ×1 IMPLANT
DRAIN CHANNEL 10F 3/8 F FF (DRAIN) ×3 IMPLANT
DRAPE U-SHAPE 76X120 STRL (DRAPES) ×3 IMPLANT
ELECT COATED BLADE 2.86 ST (ELECTRODE) ×3 IMPLANT
ELECT REM PT RETURN 9FT ADLT (ELECTROSURGICAL) ×3
ELECTRODE REM PT RTRN 9FT ADLT (ELECTROSURGICAL) ×1 IMPLANT
EVACUATOR SILICONE 100CC (DRAIN) ×3 IMPLANT
FORCEPS BIPOLAR SPETZLER 8 1.0 (NEUROSURGERY SUPPLIES) ×3 IMPLANT
GAUZE 4X4 16PLY RFD (DISPOSABLE) ×3 IMPLANT
GAUZE SPONGE 4X4 12PLY STRL LF (GAUZE/BANDAGES/DRESSINGS) IMPLANT
GLOVE BIO SURGEON STRL SZ 6.5 (GLOVE) ×4 IMPLANT
GLOVE BIO SURGEON STRL SZ7.5 (GLOVE) ×3 IMPLANT
GLOVE BIO SURGEONS STRL SZ 6.5 (GLOVE) ×2
GLOVE BIOGEL PI IND STRL 7.0 (GLOVE) ×2 IMPLANT
GLOVE BIOGEL PI INDICATOR 7.0 (GLOVE) ×4
GOWN STRL REUS W/ TWL LRG LVL3 (GOWN DISPOSABLE) ×3 IMPLANT
GOWN STRL REUS W/TWL LRG LVL3 (GOWN DISPOSABLE) ×6
HEMOSTAT SURGICEL 2X14 (HEMOSTASIS) IMPLANT
NEEDLE HYPO 25X1 1.5 SAFETY (NEEDLE) ×3 IMPLANT
NS IRRIG 1000ML POUR BTL (IV SOLUTION) ×3 IMPLANT
PACK BASIN DAY SURGERY FS (CUSTOM PROCEDURE TRAY) ×3 IMPLANT
PENCIL BUTTON HOLSTER BLD 10FT (ELECTRODE) ×3 IMPLANT
PIN SAFETY STERILE (MISCELLANEOUS) IMPLANT
PROBE NERVBE PRASS .33 (MISCELLANEOUS) ×3 IMPLANT
SHEARS HARMONIC 9CM CVD (BLADE) ×3 IMPLANT
SLEEVE SCD COMPRESS KNEE MED (MISCELLANEOUS) ×3 IMPLANT
SPONGE INTESTINAL PEANUT (DISPOSABLE) ×3 IMPLANT
STAPLER VISISTAT 35W (STAPLE) IMPLANT
SUT ETHILON 3 0 PS 1 (SUTURE) ×3 IMPLANT
SUT PROLENE 5 0 P 3 (SUTURE) IMPLANT
SUT SILK 2 0 SH (SUTURE) ×3 IMPLANT
SUT SILK 2 0 TIES 17X18 (SUTURE)
SUT SILK 2-0 18XBRD TIE BLK (SUTURE) IMPLANT
SUT SILK 3 0 TIES 17X18 (SUTURE) ×4
SUT SILK 3-0 18XBRD TIE BLK (SUTURE) ×2 IMPLANT
SUT VIC AB 3-0 FS2 27 (SUTURE) ×3 IMPLANT
SUT VICRYL 4-0 PS2 18IN ABS (SUTURE) ×3 IMPLANT
SYR BULB 3OZ (MISCELLANEOUS) ×3 IMPLANT
SYR CONTROL 10ML LL (SYRINGE) ×3 IMPLANT
TOWEL GREEN STERILE FF (TOWEL DISPOSABLE) ×6 IMPLANT
TRAY DSU PREP LF (CUSTOM PROCEDURE TRAY) ×3 IMPLANT
TUBE CONNECTING 20'X1/4 (TUBING) ×1
TUBE CONNECTING 20X1/4 (TUBING) ×2 IMPLANT
TUBE ENDOTRAC NIMS EMG 6MM (MISCELLANEOUS) IMPLANT
TUBE ENDOTRAC NIMS EMG 7MM (MISCELLANEOUS) IMPLANT
TUBE ENDOTRAC NIMS EMG 8MM (MISCELLANEOUS) ×3 IMPLANT
TUBE ENDOTRAC NIMS EMG 9MM (MISCELLANEOUS) IMPLANT

## 2018-11-07 NOTE — Transfer of Care (Signed)
Immediate Anesthesia Transfer of Care Note  Patient: Erica Bennett  Procedure(s) Performed: LEFT HEMI THYROIDECTOMY (Left Throat)  Patient Location: PACU  Anesthesia Type:General  Level of Consciousness: awake, alert  and oriented  Airway & Oxygen Therapy: Patient Spontanous Breathing and Patient connected to face mask oxygen  Post-op Assessment: Report given to RN and Post -op Vital signs reviewed and stable  Post vital signs: Reviewed and stable  Last Vitals:  Vitals Value Taken Time  BP    Temp    Pulse 85 11/07/18 1058  Resp 14 11/07/18 1058  SpO2 100 % 11/07/18 1058  Vitals shown include unvalidated device data.  Last Pain:  Vitals:   11/07/18 0835  TempSrc: Oral  PainSc: 0-No pain         Complications: No apparent anesthesia complications

## 2018-11-07 NOTE — Anesthesia Postprocedure Evaluation (Signed)
Anesthesia Post Note  Patient: Erica Bennett  Procedure(s) Performed: LEFT HEMI THYROIDECTOMY (Left Throat)     Patient location during evaluation: PACU Anesthesia Type: General Level of consciousness: awake and alert Pain management: pain level controlled Vital Signs Assessment: post-procedure vital signs reviewed and stable Respiratory status: spontaneous breathing, nonlabored ventilation and respiratory function stable Cardiovascular status: blood pressure returned to baseline and stable Postop Assessment: no apparent nausea or vomiting Anesthetic complications: no    Last Vitals:  Vitals:   11/07/18 1330 11/07/18 1400  BP: (!) 151/66   Pulse: 73   Resp: 16   Temp:    SpO2: 96% 95%    Last Pain:  Vitals:   11/07/18 1400  TempSrc:   PainSc: 4                  Lidia Collum

## 2018-11-07 NOTE — Anesthesia Preprocedure Evaluation (Addendum)
Anesthesia Evaluation    Reviewed: Allergy & Precautions, Patient's Chart, lab work & pertinent test results, reviewed documented beta blocker date and time   History of Anesthesia Complications (+) PONVNegative for: history of anesthetic complications  Airway Mallampati: III  TM Distance: >3 FB Neck ROM: Full  Mouth opening: Limited Mouth Opening  Dental  (+) Teeth Intact   Pulmonary sleep apnea , former smoker,    Pulmonary exam normal        Cardiovascular hypertension, Pt. on medications and Pt. on home beta blockers Normal cardiovascular exam     Neuro/Psych Seizures -, Well Controlled,  PSYCHIATRIC DISORDERS Anxiety Depression    GI/Hepatic Neg liver ROS, GERD  Medicated,  Endo/Other  negative endocrine ROS  Renal/GU negative Renal ROS  negative genitourinary   Musculoskeletal negative musculoskeletal ROS (+)   Abdominal   Peds  Hematology negative hematology ROS (+)   Anesthesia Other Findings Left thyroid mass Blind both eyes  Reproductive/Obstetrics                            Anesthesia Physical Anesthesia Plan  ASA: III  Anesthesia Plan: General   Post-op Pain Management:    Induction: Intravenous  PONV Risk Score and Plan: Ondansetron, Dexamethasone, Treatment may vary due to age or medical condition and Midazolam  Airway Management Planned: Oral ETT  Additional Equipment: None  Intra-op Plan:   Post-operative Plan: Extubation in OR  Informed Consent: I have reviewed the patients History and Physical, chart, labs and discussed the procedure including the risks, benefits and alternatives for the proposed anesthesia with the patient or authorized representative who has indicated his/her understanding and acceptance.     Dental advisory given  Plan Discussed with:   Anesthesia Plan Comments:         Anesthesia Quick Evaluation

## 2018-11-07 NOTE — Discharge Instructions (Signed)
Thyroidectomy, Care After This sheet gives you information about how to care for yourself after your procedure. Your health care provider may also give you more specific instructions. If you have problems or questions, contact your health care provider. What can I expect after the procedure? After the procedure, it is common to have:  Mild pain in the neck or upper body, especially when swallowing.  A swollen neck.  A sore throat.  A weak or hoarse voice.  Slight tingling or numbness around your mouth, or in your fingers or toes. This may last for a day or two after surgery. This condition is caused by low levels of calcium. You may be given calcium supplements to treat it. Follow these instructions at home:  Medicines  Take over-the-counter and prescription medicines only as told by your health care provider.  Do not drive or use heavy machinery while taking prescription pain medicine.  Do not take medicines that contain aspirin and ibuprofen until your health care provider says that you can. These medicines can increase your risk of bleeding. Eating and drinking  Start slowly with eating. You may need to have only liquids and soft foods for a few days or as directed by your health care provider.  To prevent or treat constipation while you are taking prescription pain medicine, your health care provider may recommend that you: ? Drink enough fluid to keep your urine pale yellow. ? Take over-the-counter or prescription medicines. ? Eat foods that are high in fiber, such as fresh fruits and vegetables, whole grains, and beans. ? Limit foods that are high in fat and processed sugars, such as fried and sweet foods. Incision care  Follow instructions from your health care provider about how to take care of your incision. Make sure you: ? Wash your hands with soap and water before you change your bandage (dressing). If soap and water are not available, use hand sanitizer. ? Leave  stitches (sutures), skin glue, or adhesive strips in place. These skin closures may need to stay in place for 2 weeks or longer. If adhesive strip edges start to loosen and curl up, you may trim the loose edges. Do not remove adhesive strips completely unless your health care provider tells you to do that.  Check your incision area every day for signs of infection. Check for: ? Redness, swelling, or pain. ? Fluid or blood. ? Warmth. ? Pus or a bad smell.  Do not take baths, swim, or use a hot tub until your health care provider approves. Activity  For the first 10 days after the procedure or as instructed by your health care provider: ? Do not lift anything that is heavier than 10 lb (4.5 kg). ? Do not jog, swim, or do other strenuous exercises. ? Do not play contact sports.  Avoid sitting for a long time without moving. Get up to take short walks every 1-2 hours. This is needed to improve blood flow and breathing. Ask for help if you feel weak or unsteady.  Return to your normal activities as told by your health care provider. Ask your health care provider what activities are safe for you. General instructions  Do not use any products that contain nicotine or tobacco, such as cigarettes and e-cigarettes. These can delay healing after surgery. If you need help quitting, ask your health care provider.  Keep all follow-up visits as told by your health care provider. This is important. Your health care provider needs to monitor the calcium  level in your blood to make sure that it does not become low. Contact a health care provider if you:  Have a fever.  Have more redness, swelling, or pain around your incision area.  Have fluid or blood coming from your incision area.  Notice that your incision area feels warm to the touch.  Have pus or a bad smell coming from your incision area.  Have trouble talking.  Have nausea or vomiting for more than 2 days. Get help right away if  you:  Have trouble breathing.  Have trouble swallowing.  Develop a rash.  Develop a cough that gets worse.  Notice that your speech changes, or you have hoarseness that gets worse.  Develop numbness, tingling, or muscle spasms in the arms, hands, feet, or face. Summary  After the procedure, it is common to feel mild pain in the neck or upper body, especially when swallowing.  Take medicines as told by your health care provider. These include pain medicines and thyroid hormones, if required.  Follow instructions from your health care provider about how to take care of your incision. Watch for signs of infection.  Keep all follow-up visits as told by your health care provider. This is important. Your health care provider needs to monitor the calcium level in your blood to make sure that it does not become low.  Get help right away if you develop difficulty breathing, or numbness, tingling, or muscle spasms in the arms, hands, feet, or face. This information is not intended to replace advice given to you by your health care provider. Make sure you discuss any questions you have with your health care provider. Document Released: 08/01/2004 Document Revised: 03/10/2018 Document Reviewed: 11/17/2016 Elsevier Patient Education  2020 [employer].

## 2018-11-07 NOTE — Anesthesia Procedure Notes (Signed)
Procedure Name: Intubation Performed by: Verita Lamb, CRNA Pre-anesthesia Checklist: Patient identified, Emergency Drugs available, Suction available, Patient being monitored and Timeout performed Patient Re-evaluated:Patient Re-evaluated prior to induction Oxygen Delivery Method: Circle system utilized Preoxygenation: Pre-oxygenation with 100% oxygen Induction Type: IV induction Ventilation: Mask ventilation without difficulty Laryngoscope Size: Mac, 4 and Glidescope Tube type: Oral Tube size: 8.0 mm Number of attempts: 1 Airway Equipment and Method: Stylet and Oral airway Placement Confirmation: ETT inserted through vocal cords under direct vision,  positive ETCO2 and breath sounds checked- equal and bilateral Tube secured with: Tape Dental Injury: Teeth and Oropharynx as per pre-operative assessment  Difficulty Due To: Difficulty was unanticipated Future Recommendations: Recommend- induction with short-acting agent, and alternative techniques readily available Comments: Smooth iv induction and easy mask.  Attempted with mac 4 blade and could only see epiglottis.  Pt has a very small mouth and is very anterior.  With the glidescope we obtained a grade I view but it was difficult to have the blade and place the ett due to her small oral opening. [email or web address]

## 2018-11-07 NOTE — Op Note (Signed)
DATE OF PROCEDURE:  11/07/2018                              OPERATIVE REPORT  SURGEON:  Leta Baptist, MD  PREOPERATIVE DIAGNOSES: 1. Left thyroid mass. 2. Dysphagia.  POSTOPERATIVE DIAGNOSES: 1. Left thyroid mass. 2. Dysphagia.  PROCEDURE PERFORMED:  Left total thyroid lobectomy  ANESTHESIA:  General endotracheal tube anesthesia.  COMPLICATIONS:  None.  ESTIMATED BLOOD LOSS:  Minimal.  INDICATION FOR PROCEDURE:  Erica Bennett is a 73 y.o. female with a history of a multinodular thyroid goiter. The largest nodule measured 2.1 cm on the left side.  Over the past few months, the patient has noted increasing dysphagia, with a sensation that her throat is being constricted, especially on the left side. Based on the above findings, the decision was made for the patient to undergo the above-stated procedure. Likelihood of success in reducing symptoms was also discussed.  The risks, benefits, alternatives, and details of the procedure were discussed with the patient.  Questions were invited and answered.  Informed consent was obtained.  DESCRIPTION:  The patient was taken to the operating room and placed supine on the operating table.  General endotracheal tube anesthesia was administered by the anesthesiologist.  A nerve monitoring endotracheal tube was used.  The nerve monitoring system was functional throughout the case.  The patient was positioned and prepped and draped in a standard fashion for thyroid surgery.    1% lidocaine with 1-100,000 epinephrine was infiltrated at the planned site of incision.  A lower neck transverse incision was made.  The incision was carried down to the level of the platysma muscles.  Superiorly based and inferiorly based subplatysmal flaps were elevated in the standard fashion.  The strap muscles were retracted laterally to expose the thyroid gland.  A 2 cm firm nodule was noted within the left thyroid lobe.  The left thyroid lobe was carefully dissected free from  the surrounding soft tissue.  The left recurrent laryngeal nerve was identified and preserved.  The recurrent laryngeal nerve was functional throughout the case.  A parathyroid gland was identified and preserved.  The entire left thyroid lobe was removed and sent to the pathology department for permanent histologic identification.    The surgical site was copiously irrigated.  The strap muscles were reapproximated in closed with 4-0 Vicryl sutures.  A #10 JP drain was placed.  The skin incision was closed in layers with 4-0 Vicryl and Dermabond.  The care of the patient was turned over to the anesthesiologist.  The patient was awakened from anesthesia without difficulty.  The patient was extubated and transferred to the recovery room in good condition.  OPERATIVE FINDINGS: A 2 cm firm thyroid nodule was noted within the left thyroid lobe.  SPECIMEN: Left thyroid lobe.  FOLLOWUP CARE:  The patient will be observed overnight.  She will be discharged home on postop day #1.  Erica Bennett 11/07/2018 10:55 AM

## 2018-11-08 ENCOUNTER — Encounter (HOSPITAL_BASED_OUTPATIENT_CLINIC_OR_DEPARTMENT_OTHER): Payer: Self-pay | Admitting: Otolaryngology

## 2018-11-08 DIAGNOSIS — E042 Nontoxic multinodular goiter: Secondary | ICD-10-CM | POA: Diagnosis not present

## 2018-11-08 DIAGNOSIS — F419 Anxiety disorder, unspecified: Secondary | ICD-10-CM | POA: Diagnosis not present

## 2018-11-08 DIAGNOSIS — R1310 Dysphagia, unspecified: Secondary | ICD-10-CM | POA: Diagnosis not present

## 2018-11-08 DIAGNOSIS — I10 Essential (primary) hypertension: Secondary | ICD-10-CM | POA: Diagnosis not present

## 2018-11-08 DIAGNOSIS — R569 Unspecified convulsions: Secondary | ICD-10-CM | POA: Diagnosis not present

## 2018-11-08 DIAGNOSIS — G4730 Sleep apnea, unspecified: Secondary | ICD-10-CM | POA: Diagnosis not present

## 2018-11-08 LAB — SURGICAL PATHOLOGY

## 2018-11-08 MED ORDER — ONDANSETRON HCL 4 MG PO TABS: 4.0000 mg | ORAL_TABLET | Freq: Three times a day (TID) | ORAL | 1 refills | Status: AC | PRN

## 2018-11-08 MED ORDER — ONDANSETRON HCL 4 MG/2ML IJ SOLN
4.0000 mg | Freq: Once | INTRAMUSCULAR | Status: AC
  Administered 2018-11-08: 4 mg via INTRAVENOUS
  Filled 2018-11-08: qty 2

## 2018-11-08 NOTE — Discharge Summary (Signed)
Physician Discharge Summary  Patient ID: Erica Bennett MRN: [id number] DOB/AGE: [birth year] 73 y.o.  Admit date: 11/07/2018 Discharge date: 11/08/2018  Admission Diagnoses: Left thyroid mass  Discharge Diagnoses: Left thyroid mass Active Problems:   S/P partial thyroidectomy   Discharged Condition: fair  Hospital Course: Pt had an uneventful overnight stay. She has some nausea. No bleeding. No stridor.  Consults: None  Significant Diagnostic Studies: None  Treatments: surgery: Left hemithyroidectomy  Discharge Exam: Blood pressure (!) 158/73, pulse 70, temperature 97.6 F (36.4 C), resp. rate 20, height '5\' 6"'$  (1.676 m), weight 85.3 kg, SpO2 97 %. Incision/Wound:c/d/i  Disposition: Discharge disposition: 01-Home or Self Care       Discharge Instructions    Activity as tolerated - No restrictions   Complete by: As directed    Diet general   Complete by: As directed      Allergies as of 11/08/2018      Reactions   Bacitracin Rash   Neomycin Rash   Sulfa Antibiotics Swelling, Rash   Sulfamethoxazole Other (See Comments), Swelling   Eggs Or Egg-derived Products Diarrhea   Stomach cramps   Elavil [amitriptyline Hcl] Other (See Comments)   Really bad tremors   Gabapentin Other (See Comments)   Balance issues   Prozac [fluoxetine Hcl]    Severe headache      Medication List    STOP taking these medications   HYDROcodone-acetaminophen 5-325 MG tablet Commonly known as: NORCO/VICODIN     TAKE these medications   ALPRAZolam 1 MG tablet Commonly known as: XANAX Take 1 tablet (1 mg total) by mouth at bedtime.   amoxicillin 875 MG tablet Commonly known as: AMOXIL Take 1 tablet (875 mg total) by mouth 2 (two) times daily for 5 days.   Carbatrol 200 MG 12 hr capsule Generic drug: carbamazepine Take 1 capsule (200 mg total) by mouth daily.   Dexilant 60 MG capsule Generic drug: dexlansoprazole TAKE ONE CAPSULE BY MOUTH DAILY; ON AN EMPTY STOMACH, THEN  DO NOT EAT FOR ONE HOUR. What changed: See the new instructions.   ECHINACEA PO Take 1,520 mg by mouth daily. 760 mg per capsule   hydroxychloroquine 200 MG tablet Commonly known as: PLAQUENIL Take 1 tablet (200 mg total) by mouth 2 (two) times daily.   Maxalt 10 MG tablet Generic drug: rizatriptan Take 1 tablet (10 mg total) by mouth as needed. What changed:   when to take this  reasons to take this   metoprolol succinate 100 MG 24 hr tablet Commonly known as: TOPROL-XL Take 1 tablet (100 mg total) by mouth every evening.   Narcan 4 MG/0.1ML Liqd nasal spray kit Generic drug: naloxone Place 1 spray into the nose as needed (accidental overdose.).   ondansetron 4 MG tablet Commonly known as: Zofran Take 1 tablet (4 mg total) by mouth every 8 (eight) hours as needed for up to 5 days for nausea or vomiting.   oxyCODONE-acetaminophen 5-325 MG tablet Commonly known as: Percocet Take 1 tablet by mouth every 4 (four) hours as needed for up to 5 days for severe pain.   prednisoLONE acetate 1 % ophthalmic suspension Commonly known as: PRED FORTE Place 1 drop into both eyes 2 (two) times daily.      Follow-up Information    Leta Baptist, MD On 11/14/2018.   Specialty: Otolaryngology Why: at [employer] information: [address] [place] [address] [phone]           Signed: Kasandra Knudsen  Cassie Freer 11/08/2018, 7:58 AM

## 2018-11-10 ENCOUNTER — Ambulatory Visit (HOSPITAL_COMMUNITY): Payer: Medicare Other | Admitting: Psychiatry

## 2018-11-14 ENCOUNTER — Ambulatory Visit (INDEPENDENT_AMBULATORY_CARE_PROVIDER_SITE_OTHER): Payer: Medicare Other | Admitting: Otolaryngology

## 2018-11-14 ENCOUNTER — Other Ambulatory Visit: Payer: Self-pay

## 2018-11-25 NOTE — H&P (Addendum)
Cc: Thyroid nodules  HPI: The patient is a 73 y/o female who returns today with her husband for follow up evaluation of a chronic cough, hoarseness, and thyroid nodules. The patient was last seen in October. At that time, laryngoscopy showed posterior laryngeal edema, consistent with her history of reflux. No other abnormalities were noted. The patient was continued on her reflux medication. Her cough was felt to be neurogenic. The patient was placed on a trial of Gabapentin. Thyroid ultrasound was also obtained. Ultrasound showed a multinodular thyroid goiter with the largest nodule measuring 2.1 cm. The patient underwent FNA with pathology consistent with a benign lesion. The patient returns today noting resolution of her cough. She is no longer taking gabapentin. The patient is having increasing hoarseness and episodes of dysphagia. She feels like her throat is constricted at times, especially in certain positions. The patient underwent vocal therapy in the past at [facility]. No other ENT, GI, or respiratory issue noted since the last visit.   Exam: General: Communicates without difficulty, well nourished, no acute distress. Head: Normocephalic, no evidence injury, no tenderness, facial buttresses intact without stepoff. Eyes: PERRL, EOMI. No scleral icterus, conjunctivae clear. Ears: External auditory canals clear bilaterally. There is no edema or erythema. Tympanic membrane is within normal limits bilaterally. Nose: Normal skin and external support. Anterior rhinoscopy reveals healthy pink mucosa over the septum and turbinates. No lesions or polyps were seen. Oral cavity: Lips without lesions, oral mucosa moist, no masses or lesions seen. Indirect  mirror laryngoscopy could not be tolerated. Pharynx: Clear, no erythema. Neck: Supple, full range of motion, no lymphadenopathy, no masses palpable. Thyroid bed full to palpation. Salivary: Parotid and submandibular glands without mass. Neuro:  CN 2-12 grossly  intact. Gait normal. Vestibular: No nystagmus at any point of gaze.   Procedure:  Flexible Fiberoptic Laryngoscopy -- Risks, benefits, and alternatives of flexible endoscopy were explained to the patient. Specific mention was made of the risk of throat numbness with difficulty swallowing, possible bleeding from the nose and mouth, and pain from the procedure. The patient gave oral consent to proceed. The flexible scope was inserted into the right nasal cavity and advanced towards the nasopharynx. Visualized mucosa over the turbinates and septum were normal. The nasopharynx was clear. Oropharyngeal walls were symmetric and mobile without lesion, mass, or edema. Hypopharynx was also without  lesion or edema. Larynx was mobile without lesions. No lesions or asymmetry in the supraglottic larynx. Arytenoid mucosa was edematous with slight erythema. True vocal folds were white without mass or lesion. Decreased right vocal cord movement with strained vocal quality. Base of tongue was within normal limits. The patient tolerated the procedure well.   Assessment 1. The patient's hoarseness is likely secondary to right vocal cord weakness. No other suspicious mass or lesion is noted on today's fiberoptic laryngoscopy exam. 2. Multinodular thyroid goiter with the largest nodule on the left measuring 2.1 cm. The patient is currently experiencing compressive symptoms.  3. The patient's chronic cough has resolved  Plan  1. The physical exam and laryngoscopy findings are reviewed with the patient and her husband.  2. Continue with her current reflux medication regimen.  3. Treatment options for her thyroid nodule include continued observation versus left hemithyroidectomy. The risks, benefits, alternatives, and details of the procedure are reviewed with the patient. Questions are invited and answered. 4. The patient would like to proceed with the procedure. This will be scheduled in accordance with the patient and  hospital schedule.

## 2018-11-29 DIAGNOSIS — G894 Chronic pain syndrome: Secondary | ICD-10-CM | POA: Diagnosis not present

## 2018-11-29 DIAGNOSIS — G8929 Other chronic pain: Secondary | ICD-10-CM | POA: Diagnosis not present

## 2018-11-29 DIAGNOSIS — M545 Low back pain: Secondary | ICD-10-CM | POA: Diagnosis not present

## 2018-11-29 DIAGNOSIS — Z79899 Other long term (current) drug therapy: Secondary | ICD-10-CM | POA: Diagnosis not present

## 2018-12-06 DIAGNOSIS — Z23 Encounter for immunization: Secondary | ICD-10-CM | POA: Diagnosis not present

## 2018-12-28 DIAGNOSIS — Z79899 Other long term (current) drug therapy: Secondary | ICD-10-CM | POA: Diagnosis not present

## 2018-12-28 DIAGNOSIS — G894 Chronic pain syndrome: Secondary | ICD-10-CM | POA: Diagnosis not present

## 2018-12-28 DIAGNOSIS — M359 Systemic involvement of connective tissue, unspecified: Secondary | ICD-10-CM | POA: Diagnosis not present

## 2018-12-29 ENCOUNTER — Other Ambulatory Visit: Payer: Self-pay | Admitting: Adult Health

## 2018-12-29 DIAGNOSIS — G40409 Other generalized epilepsy and epileptic syndromes, not intractable, without status epilepticus: Secondary | ICD-10-CM

## 2019-01-03 ENCOUNTER — Encounter (HOSPITAL_COMMUNITY): Payer: Self-pay | Admitting: Psychiatry

## 2019-01-03 ENCOUNTER — Other Ambulatory Visit: Payer: Self-pay

## 2019-01-03 ENCOUNTER — Ambulatory Visit (INDEPENDENT_AMBULATORY_CARE_PROVIDER_SITE_OTHER): Payer: Medicare Other | Admitting: Psychiatry

## 2019-01-03 DIAGNOSIS — F321 Major depressive disorder, single episode, moderate: Secondary | ICD-10-CM | POA: Diagnosis not present

## 2019-01-04 ENCOUNTER — Encounter (HOSPITAL_COMMUNITY): Payer: Self-pay | Admitting: Psychiatry

## 2019-01-04 DIAGNOSIS — E559 Vitamin D deficiency, unspecified: Secondary | ICD-10-CM | POA: Diagnosis not present

## 2019-01-04 DIAGNOSIS — E7800 Pure hypercholesterolemia, unspecified: Secondary | ICD-10-CM | POA: Diagnosis not present

## 2019-01-04 DIAGNOSIS — Z1159 Encounter for screening for other viral diseases: Secondary | ICD-10-CM | POA: Diagnosis not present

## 2019-01-04 DIAGNOSIS — I10 Essential (primary) hypertension: Secondary | ICD-10-CM | POA: Diagnosis not present

## 2019-01-04 DIAGNOSIS — R7303 Prediabetes: Secondary | ICD-10-CM | POA: Diagnosis not present

## 2019-01-04 DIAGNOSIS — Z9889 Other specified postprocedural states: Secondary | ICD-10-CM | POA: Diagnosis not present
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
## 2019-01-17 ENCOUNTER — Ambulatory Visit (INDEPENDENT_AMBULATORY_CARE_PROVIDER_SITE_OTHER): Payer: Medicare Other | Admitting: Psychiatry

## 2019-01-17 ENCOUNTER — Other Ambulatory Visit: Payer: Self-pay

## 2019-01-17 DIAGNOSIS — F321 Major depressive disorder, single episode, moderate: Secondary | ICD-10-CM | POA: Diagnosis not present

## 2019-01-17 NOTE — Progress Notes (Signed)
Virtual Visit via Telephone Note  I connected with Erica Bennett on 01/17/19 at 11:15 AM EST by telephone and verified that I am speaking with the correct person using two identifiers.   I discussed the limitations, risks, security and privacy concerns of performing an evaluation and management service by telephone and the availability of in person appointments. I also discussed with the patient that there may be a patient responsible charge related to this service. The patient expressed understanding and agreed to proceed.   I provided 45 minutes of non-face-to-face time during this encounter.   [name], LCSW            THERAPIST PROGRESS NOTE  Session Time: Tuesday 01/17/2019 11:15 AM - 12:00 PM  Participation Level: Active  Behavioral Response: AlertAnxious  Type of Therapy: Individual Therapy  Treatment Goals addressed: Reestablish rapport, learn and implement cognitive and behavioral strategies to cope with anxiety and stress  Interventions: CBT and Supportive  Summary: Erica Bennett is a 73 y.o. female who  is self -referred and is a returning patient to this clinician. She last was seen in 2017. She denies any psychiatric hospitalizations. She continues to see psychiatrist Dr. Harrington Challenger  for medicatiion management.  Patient reports experiencing symptoms of anxiety including ruminating thoughts and sleep difficulty triggered by her husband's relapse.  He has a history of alcohol abuse/dependence.  Patient has been totally blind since her 33s.  She also recently was diagnosed with autoimmune disease per her report.    Patient last was seen via virtual visit about 2 weeks ago.  She reports some improvement in sleep pattern but continued stress and anxiety.  She reports worrying constantly about her husband's behavior and fears he will resume use of alcohol.  She reports feeling very vulnerable due to her blindness and auto immune issues.  She also reports trust issues as she  suspects husband of emotional infidelity in their marriage.  She reports she has confronted him but he denies.  Patient expresses resentment, anger, hurt, and disappointment.  She also verbalizes anger against self.  Patient reports additional stress related to being confined to her home due to COVID-19 pandemic.  She does try to make contact with friends via phone calls.  Suicidal/Homicidal: Nowithout intent/plan  Therapist Response: Reestablished rapport, reviewed symptoms, discussed stressors, facilitated expression of thoughts and feelings, validated feelings, began to discuss behavioral targets, reviewed relaxation breathing to trigger relaxation response, assigned patient to practice relaxation breathing daily  Plan: Return again in 2-3  weeks.  Diagnosis: Axis I: Major Depression, single episode    Axis II: No diagnosis    [name], LCSW 01/17/2019

## 2019-01-31 ENCOUNTER — Ambulatory Visit (INDEPENDENT_AMBULATORY_CARE_PROVIDER_SITE_OTHER): Payer: Medicare Other | Admitting: Psychiatry

## 2019-01-31 ENCOUNTER — Other Ambulatory Visit: Payer: Self-pay

## 2019-01-31 DIAGNOSIS — F321 Major depressive disorder, single episode, moderate: Secondary | ICD-10-CM

## 2019-01-31 NOTE — Progress Notes (Signed)
Virtual Visit via Telephone Note  I connected with [name] on 01/31/19 at 1:05 PM EST by telephone and verified that I am speaking with the correct person using two identifiers.   I discussed the limitations, risks, security and privacy concerns of performing an evaluation and management service by telephone and the availability of in person appointments. I also discussed with the patient that there may be a patient responsible charge related to this service. The patient expressed understanding and agreed to proceed.  I provided 50  minutes of non-face-to-face time during this encounter.   [name], LCSW                   THERAPIST PROGRESS NOTE  Session Time: Tuesday 01/31/2019 1:05 PM - 1:55 PM  Participation Level: Active  Behavioral Response: AlertAnxious  Type of Therapy: Individual Therapy  Treatment Goals addressed: Reestablish rapport, learn and implement cognitive and behavioral strategies to cope with anxiety and stress  Interventions: CBT and Supportive  Summary: DERONDA ELDER is a 74 y.o. female who  is self -referred and is a returning patient to this clinician. She last was seen in 2017. She denies any psychiatric hospitalizations. She continues to see psychiatrist Dr. Harrington Challenger  for medicatiion management.  Patient reports experiencing symptoms of anxiety including ruminating thoughts and sleep difficulty triggered by her husband's relapse.  He has a history of alcohol abuse/dependence.  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  She also recently was diagnosed with autoimmune disease per her report.    Patient last was seen via virtual visit about 2 weeks ago.  She reports increased stress and anxiety along with sleep difficulty; due to husband's increased alcohol use and subsequent withdrawal symptoms during the holidays. She reports this also triggered neuropathy in his feet and he now has to use a walker. Patient reports she has to help him with her  personal care needs as he has been incapacitated for the past few days. He is starting to eat better today and has talked with his doctor who has prescribed medication. Patient expresses anger, resentment, and frustration. She also reports continued feelings of betrayal and trust issues due to his pattern of lying about drinking and her suspicions about possible marital infidelity.  She reports talking to husband yesterday about a possible legal separation and moving into her own place. He is against this.  She has been practicing deep breathing to cope and reading to try to cope with anxiety. She also talks with people from her [employer] group for support. Suicidal/Homicidal: Nowithout intent/plan  Therapist Response:, reviewed symptoms, discussed stressors, facilitated expression of thoughts and feelings, validated feelings, developed treatment plan, began to discuss behavioral targets, reviewed relaxation breathing to trigger relaxation response, assigned patient to practice relaxation breathing daily  Plan: Return again in 2-3  weeks.  Diagnosis: Axis I: Major Depression, single episode    Axis II: No diagnosis    [name], LCSW 01/31/2019

## 2019-02-09 IMAGING — US US FNA BIOPSY THYROID 1ST LESION
1 series · 13 of 16 positions shown · non-contrast
Comparison: Thyroid ultrasound 10/07/2017

MEDICATIONS:
None

COMPLICATIONS:
None immediate.

INDICATION: Indeterminate thyroid nodule LEFT thyroid lobe

EXAM:
ULTRASOUND GUIDED FINE NEEDLE ASPIRATION OF INDETERMINATE THYROID
NODULE
TECHNIQUE: Informed written consent was obtained from the patient after a
discussion of the risks, benefits and alternatives to treatment.
Questions regarding the procedure were encouraged and answered. A
timeout was performed prior to the initiation of the procedure.

[Series 1: us fna biopsy thyroid 1st lesion · 16 acquisitions, 13 frames shown]
[im 1/16]
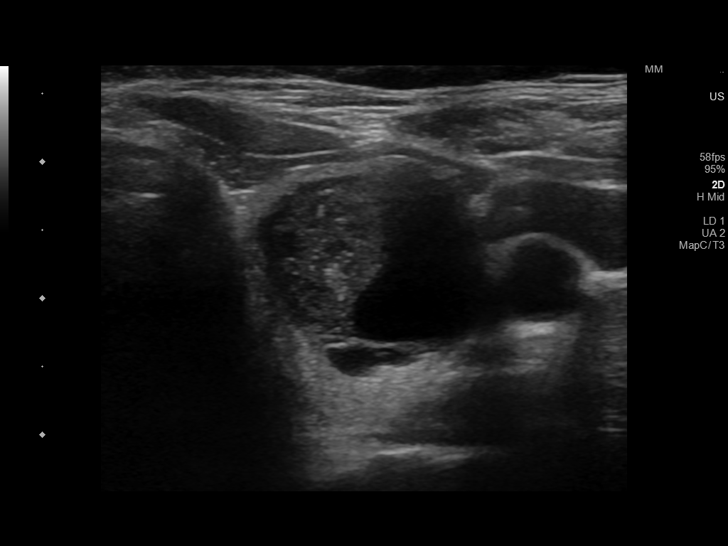
[im 2/16]
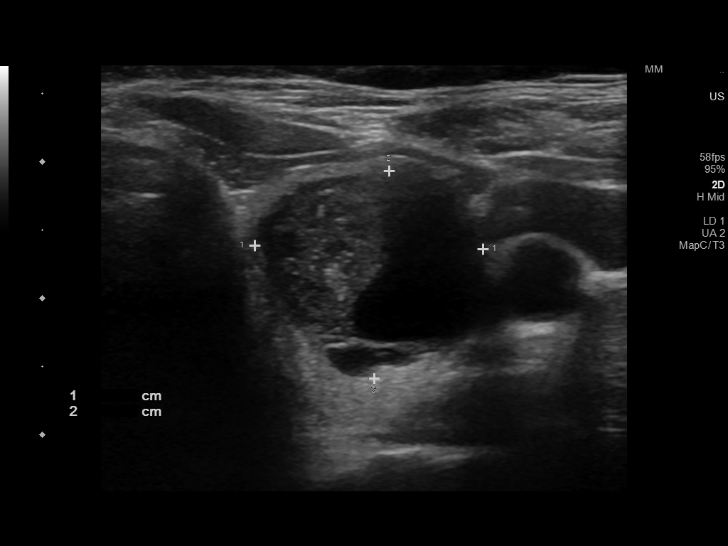
[im 4/16]
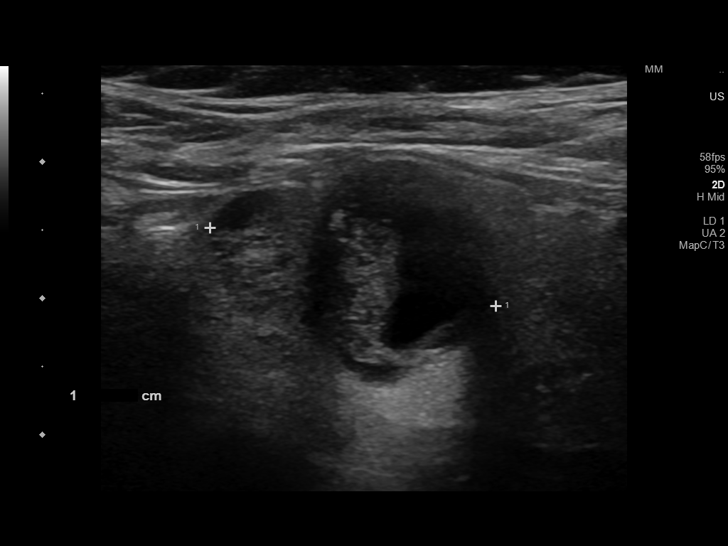
[im 5/16]
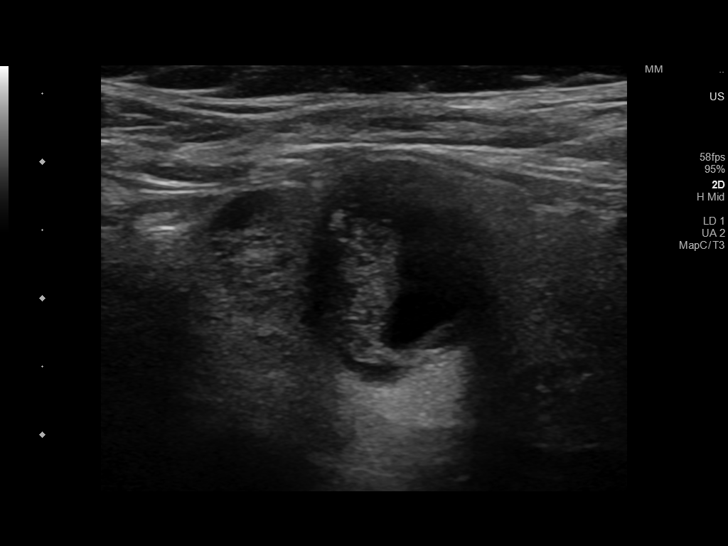
[im 6/16]
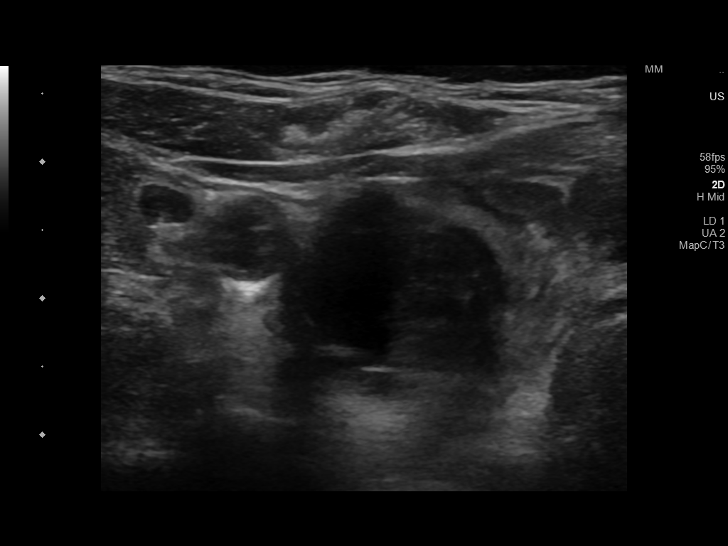
[im 7/16]
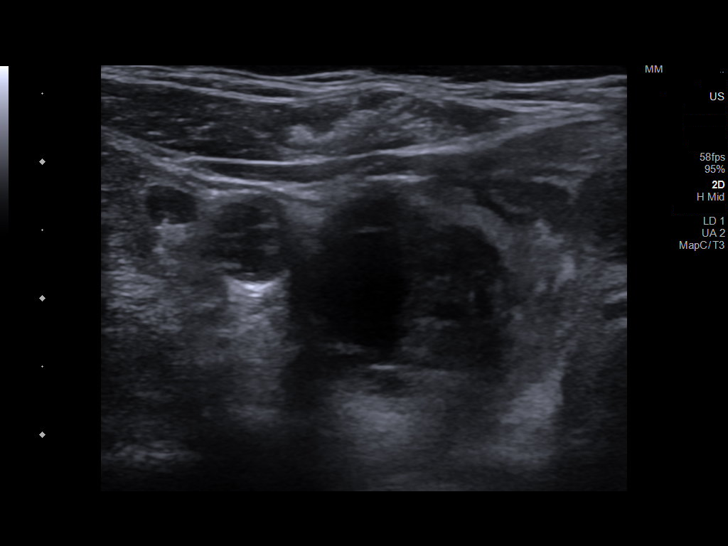
[im 9/16]
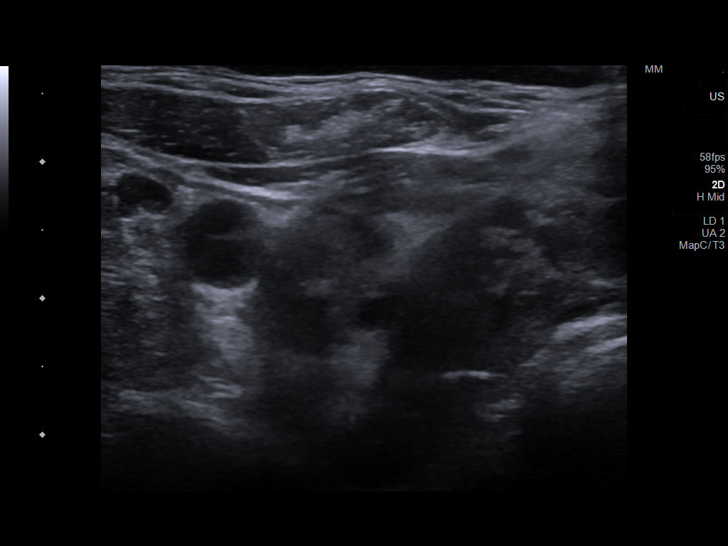
[im 10/16]
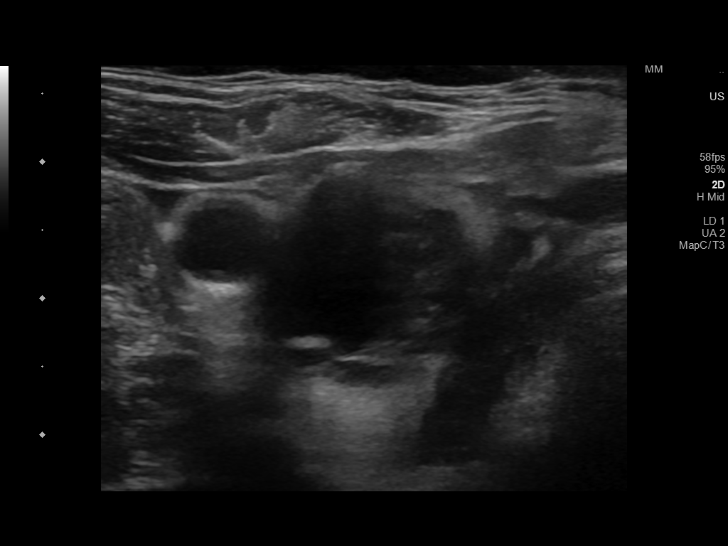
[im 11/16]
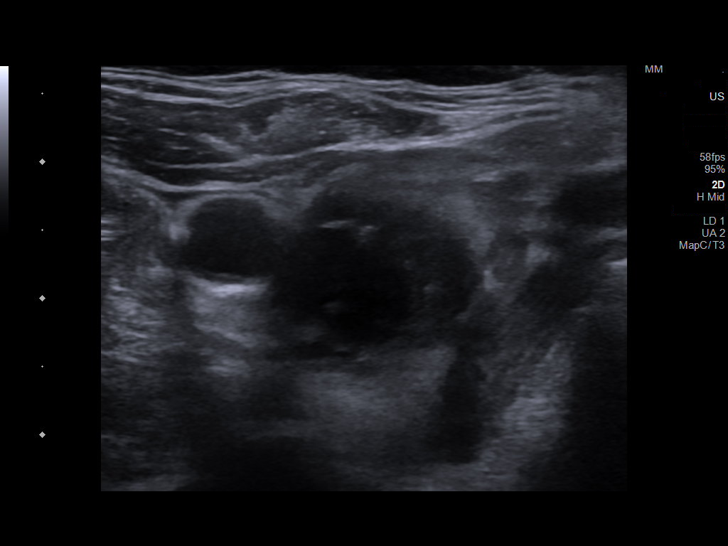
[im 12/16]
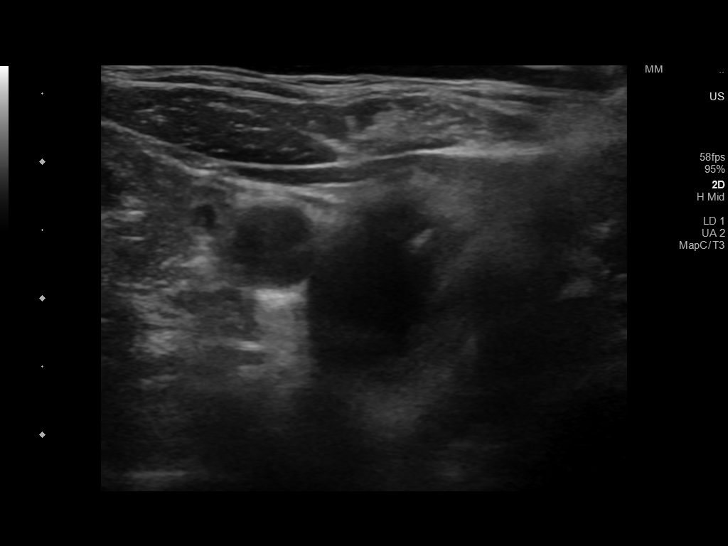
[im 13/16]
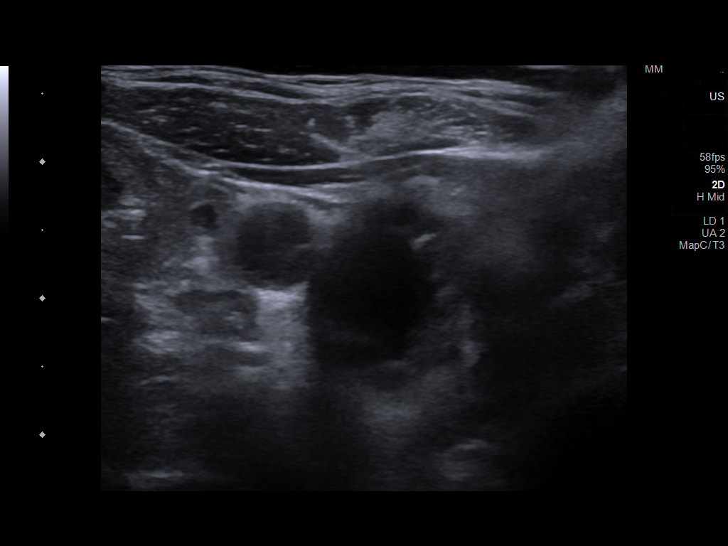
[im 15/16]
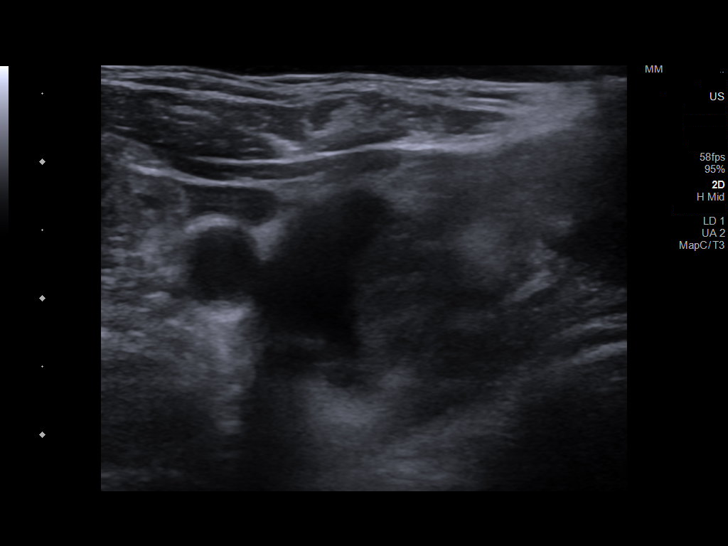
[im 16/16]
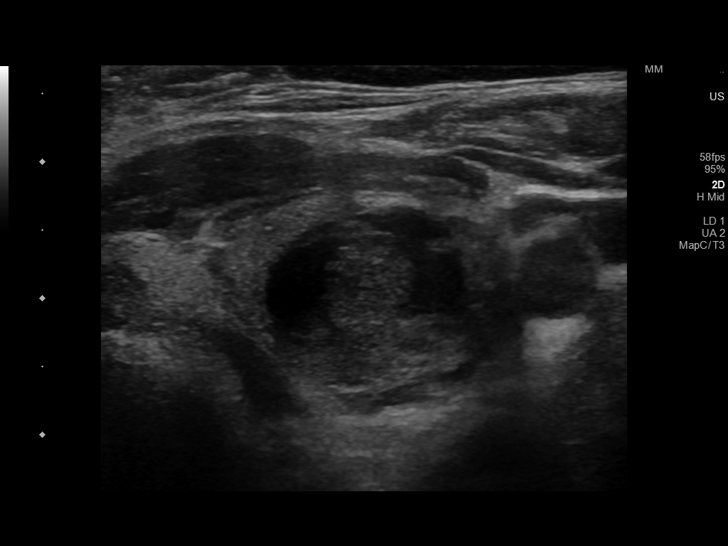

[13 of 16 positions shown; findings below may reference images not displayed]

Pre-procedural ultrasound scanning demonstrated unchanged size and
appearance of the indeterminate nodule within the LEFT lobe, which
has solid and cystic components as well as microcalcifications

The procedure was planned. The neck was prepped in the usual sterile
fashion, and a sterile drape was applied covering the operative
field. A timeout was performed prior to the initiation of the
procedure. Local anesthesia was provided with 1% lidocaine.

Under direct ultrasound guidance, 5 FNA biopsies were performed of
the LEFT lobe complex solid and cystic with a 25 gauge needle.
Multiple ultrasound images were saved for procedural documentation
purposes. The samples were prepared and submitted to pathology.

Limited post procedural scanning was negative for hematoma or
additional complication. Dressings were placed. The patient
tolerated the above procedures procedure well without immediate
postprocedural complication.
FINDINGS: Nodule reference number based on prior diagnostic ultrasound: #4

Maximum size: 2.1 cm

Location: LEFT; Mid

ACR TI-RADS risk category: TR4 (4-6 points)

Reason for biopsy: meets ACR TI-RADS criteria

Ultrasound imaging confirms appropriate placement of the needles
within the thyroid nodule.
IMPRESSION: Technically successful ultrasound guided fine needle aspiration of a
complex solid and cystic nodule in the mid LEFT thyroid lobe.

## 2019-02-10 DIAGNOSIS — M359 Systemic involvement of connective tissue, unspecified: Secondary | ICD-10-CM | POA: Diagnosis not present

## 2019-02-10 DIAGNOSIS — Z79899 Other long term (current) drug therapy: Secondary | ICD-10-CM | POA: Diagnosis not present

## 2019-02-10 DIAGNOSIS — G894 Chronic pain syndrome: Secondary | ICD-10-CM | POA: Diagnosis not present

## 2019-02-13 DIAGNOSIS — M2550 Pain in unspecified joint: Secondary | ICD-10-CM | POA: Diagnosis not present

## 2019-02-13 DIAGNOSIS — R768 Other specified abnormal immunological findings in serum: Secondary | ICD-10-CM | POA: Diagnosis not present

## 2019-02-13 DIAGNOSIS — E663 Overweight: Secondary | ICD-10-CM | POA: Diagnosis not present

## 2019-02-13 DIAGNOSIS — M791 Myalgia, unspecified site: Secondary | ICD-10-CM | POA: Diagnosis not present

## 2019-02-13 DIAGNOSIS — M25561 Pain in right knee: Secondary | ICD-10-CM | POA: Diagnosis not present

## 2019-02-13 DIAGNOSIS — R21 Rash and other nonspecific skin eruption: Secondary | ICD-10-CM | POA: Diagnosis not present

## 2019-02-13 DIAGNOSIS — M359 Systemic involvement of connective tissue, unspecified: Secondary | ICD-10-CM | POA: Diagnosis not present

## 2019-02-13 DIAGNOSIS — R29818 Other symptoms and signs involving the nervous system: Secondary | ICD-10-CM | POA: Diagnosis not present

## 2019-02-13 DIAGNOSIS — Z6829 Body mass index (BMI) 29.0-29.9, adult: Secondary | ICD-10-CM | POA: Diagnosis not present

## 2019-02-13 DIAGNOSIS — R05 Cough: Secondary | ICD-10-CM | POA: Diagnosis not present

## 2019-02-15 ENCOUNTER — Ambulatory Visit (INDEPENDENT_AMBULATORY_CARE_PROVIDER_SITE_OTHER): Payer: Medicare Other | Admitting: Psychiatry

## 2019-02-15 ENCOUNTER — Other Ambulatory Visit: Payer: Self-pay

## 2019-02-15 DIAGNOSIS — F321 Major depressive disorder, single episode, moderate: Secondary | ICD-10-CM | POA: Diagnosis not present

## 2019-02-15 NOTE — Progress Notes (Signed)
Virtual Visit via Telephone Note  I connected with Erica Bennett on 02/15/19 at  1:00 PM EST by telephone and verified that I am speaking with the correct person using two identifiers.   I discussed the limitations, risks, security and privacy concerns of performing an evaluation and management service by telephone and the availability of in person appointments. I also discussed with the patient that there may be a patient responsible charge related to this service. The patient expressed understanding and agreed to proceed.  I provided 50 minutes of non-face-to-face time during this encounter.   [name], LCSW           THERAPIST PROGRESS NOTE  Session Time: Wednesday 02/15/2019 1:10 PM -  2:00 PM         Participation Level: Active  Behavioral Response: AlertAnxious  Type of Therapy: Individual Therapy  Treatment Goals addressed: , learn and implement cognitive and behavioral strategies to cope with anxiety and stress  Interventions: CBT and Supportive  Summary: Erica Bennett is a 74 y.o. female who  is self -referred and is a returning patient to this clinician. She last was seen in 2017. She denies any psychiatric hospitalizations. She continues to see psychiatrist Dr. Harrington Challenger  for medicatiion management.  Patient reports experiencing symptoms of anxiety including ruminating thoughts and sleep difficulty triggered by her husband's relapse.  He has a history of alcohol abuse/dependence.  Patient has been totally blind since her 13s.  She also recently was diagnosed with autoimmune disease per her report.    Patient last was seen via virtual visit about 2 weeks ago.  She reports feeling better physically since last session.  Per her report, she recently saw a rheumatologist and was given steroids which have reduced her pain significantly.  She reports moving better and having in proved balance.  She reports husband's withdrawal symptoms have decreased and this has been helpful.   She still worries he may resume alcohol use.  She continues to experience anxiety when he goes out as she fears he may get alcohol.  She feels particularly vulnerable as she is blind.  This results in husband being able to hide things from her more easily per her report.  She is pleased they have had a conversation about his last episode and she was able to express her concerns and feelings.  He is being referred to a professional therapist in [place] and patient hopes his root issues will be addressed.  However, she continues to consider possibility of marital separation depending upon his cooperation with therapy and future behavior.  Patient reports she has been practicing deep breathing and says it is sometimes helpful.  Suicidal/Homicidal: Nowithout intent/plan  Therapist Response:, reviewed symptoms, praised and reinforced patient's use of deep breathing, discussed effects discussed stressors, facilitated expression of thoughts and feelings, validated feelings, assisted patient began to examine her patterns of interaction with her husband, assisted patient identify how she reacts to triggers of anxiety, began to introduce mindfulness and the use of the window of tolerance to regulate emotions, will discuss more next session,   Plan: Return again in 2-3  weeks.  Diagnosis: Axis I: Major Depression, single episode    Axis II: No diagnosis    [name], LCSW 02/15/2019

## 2019-03-02 ENCOUNTER — Other Ambulatory Visit: Payer: Self-pay

## 2019-03-02 ENCOUNTER — Ambulatory Visit (INDEPENDENT_AMBULATORY_CARE_PROVIDER_SITE_OTHER): Payer: Medicare Other | Admitting: Psychiatry

## 2019-03-02 DIAGNOSIS — F321 Major depressive disorder, single episode, moderate: Secondary | ICD-10-CM

## 2019-03-02 NOTE — Progress Notes (Signed)
Virtual Visit via Telephone Note  I connected with Erica Bennett on 03/02/19 at 2:10 PM ESTby telephone and verified that I am speaking with the correct person using two identifiers.   I discussed the limitations, risks, security and privacy concerns of performing an evaluation and management service by telephone and the availability of in person appointments. I also discussed with the patient that there may be a patient responsible charge related to this service. The patient expressed understanding and agreed to proceed.    I provided 45 minutes of non-face-to-face time during this encounter.   [name], LCSW            THERAPIST PROGRESS NOTE  Session Time: Thursday 03/02/2019 2:10 PM -  2:55 PM        Participation Level: Active  Behavioral Response: AlertAnxious  Type of Therapy: Individual Therapy  Treatment Goals addressed: , learn and implement cognitive and behavioral strategies to cope with anxiety and stress  Interventions: CBT and Supportive  Summary: Erica Bennett is a 74 y.o. female who  is self -referred and is a returning patient to this clinician. She last was seen in 2017. She denies any psychiatric hospitalizations. She continues to see psychiatrist Dr. Harrington Challenger  for medicatiion management.  Patient reports experiencing symptoms of anxiety including ruminating thoughts and sleep difficulty triggered by her husband's relapse.  He has a history of alcohol abuse/dependence.  Patient has been totally blind since her 61s.  She also recently was diagnosed with autoimmune disease per her report.    Patient last was seen via virtual visit about 2 weeks ago.  She reports  feeling less depressed but continuing to experience anxiety, worry, and nervousness.  She continues to fear husband may resume alcohol use when he goes out.  She also now fears he may be experiencing some cognitive decline as he is becoming more forgetful and recently made significant errors in balancing  the checkbook.  This triggered thoughts of who who will be there to take care of them if he is declining.  This causes patient to feel even more vulnerable.  She reports she has had positive interaction with ladies at her church and says this has been helpful.  Prior to this, patient reports having social interaction only with [employer] members.  She reports this was stressful as the conversation eventually always focused on alcoholism and problems. Suicidal/Homicidal: Nowithout intent/plan  Therapist Response:, reviewed symptoms, discussed stressors, facilitated expression of thoughts and feelings, validated feelings, discussed rationale for and developed plan with patient to designate a daily worry time to cope with anxiety and worry, discussed the role of balance in coping with stress/anxiety  and developed plan with patient to increase contact with group at her church to achieve balance between stress in her life and sources of energy/pleasure   Plan: Return again in 2-3  weeks.  Diagnosis: Axis I: Major Depression, single episode    Axis II: No diagnosis    [name], LCSW 03/02/2019

## 2019-03-03 DIAGNOSIS — Z23 Encounter for immunization: Secondary | ICD-10-CM | POA: Diagnosis not present

## 2019-03-06 ENCOUNTER — Other Ambulatory Visit: Payer: Self-pay

## 2019-03-06 ENCOUNTER — Encounter (HOSPITAL_COMMUNITY): Payer: Self-pay | Admitting: Psychiatry

## 2019-03-06 ENCOUNTER — Ambulatory Visit (INDEPENDENT_AMBULATORY_CARE_PROVIDER_SITE_OTHER): Payer: Medicare Other | Admitting: Psychiatry

## 2019-03-06 DIAGNOSIS — F321 Major depressive disorder, single episode, moderate: Secondary | ICD-10-CM

## 2019-03-06 MED ORDER — DULOXETINE HCL 20 MG PO CPEP: 20.0000 mg | ORAL_CAPSULE | Freq: Every day | ORAL | 2 refills | Status: DC

## 2019-03-06 MED ORDER — ALPRAZOLAM 1 MG PO TABS: 1.0000 mg | ORAL_TABLET | Freq: Every day | ORAL | 3 refills | Status: DC

## 2019-03-06 NOTE — Progress Notes (Signed)
Virtual Visit via Telephone Note  I connected with Erica Bennett on 03/06/19 at  2:00 PM EST by telephone and verified that I am speaking with the correct person using two identifiers.   I discussed the limitations, risks, security and privacy concerns of performing an evaluation and management service by telephone and the availability of in person appointments. I also discussed with the patient that there may be a patient responsible charge related to this service. The patient expressed understanding and agreed to proceed.     I discussed the assessment and treatment plan with the patient. The patient was provided an opportunity to ask questions and all were answered. The patient agreed with the plan and demonstrated an understanding of the instructions.   The patient was advised to call back or seek an in-person evaluation if the symptoms worsen or if the condition fails to improve as anticipated.  I provided 15 minutes of non-face-to-face time during this encounter.   [name], MD  [facility] MD/PA/NP OP Progress Note  03/06/2019 2:27 PM Erica Bennett  MRN:  [id number]  Chief Complaint:  Chief Complaint    Depression; Anxiety; Follow-up     HPI: this patient is a74 year old married white female who lives with her husband in [place]. They've been married for 14years. She has no children. She is on disability for congenital blindness.  The patient was referred by Dr.Hosanji, her primary physician, for further assessment of depression anxiety.  The patient states that her mother contracted rubella when she was pregnant with her. She developed congenital blindness that worsened in her 16s with severe macular degeneration. By her 7s she was totally blind. 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  She has  learned Braille and gets help through the [employer]. She was very comfortable doing things for herself. Both her parents are deceased and she doesn't have any other family. 11 years ago she reconnected with a man that she grown up with all her life. He had a history of alcoholism but claimed he had been sober for 5 years. They got married initially it went okay but then he began drinking. He goes through alcoholic bouts that can last months to years and he usually drinks about a half gallon of vodka a day. He last went through treatment last June but around March of this year he started drinking again. She felt very taken advantage of that she can't see what he is doing but she has been finding the bottles in confronting him and he is been lying about it. This is made her very uncomfortable. He is her power of attorney and they share a bank account. They've been arguing a lot more and she is become increasingly anxious and somewhat depressed.  The patient did have some prior treatment in her 24s when she went blind. She saw psychiatrist to help deal with the change and also took Paxil for a while. She's not had any treatment since. She's very active in Al-Anon and has several good friends from the program. She knows that she should detach from her alcoholic husband but it's difficult to do so because of her dependency on him.  The patient has always had difficulty sleeping because of the circadian problem with being blind. She has tried [place] and Ambien but  they didn't help and the newer drugs for this are extremely expensive. She is on a low dose of Xanax which is helped a little bit but she only gets about 3-4 hours of sleep a night. She is tired through the day, her energy is low. She denies suicidal ideation. She enjoys doing things with friends but will often walk him around because of her husbands behavior. She herself does not drink or use drugs and she's never had psychotic  symptoms  The patient returns after 4 months.  She states that she has gone through a lot of stress in the intervening time.  Her husband started drinking fairly heavily shortly after I last saw her.  She also found out through friends that he had been flirting with other women that worked various convenient stores.  This made her feel worse and more depressed.  He was coming home intoxicated and passed out several times.  He even did this the day after her partial thyroidectomy when she really needed his help.  She states that she has become more depressed and even had one episode of feeling suicidal when he passed out  but she claimed that she would never act on this.  He has been doing better since January 1.  He quit alcohol cold [place] and went through detox at home.  He since then gone to a therapist.  As far she knows he is no longer drinking and is spending a lot more time at home with her.  She does feel more depressed and is also in a greater amount of pain from her connective tissue disorder and has been diagnosed with fibromyalgia.  I suggested we try a low dose of Cymbalta which helps fibromyalgia and depression and she agrees.  The Xanax continues to help with her anxiety and sleep. Visit Diagnosis:    ICD-10-CM   1. Moderate single current episode of major depressive disorder ([place])  F32.1     Past Psychiatric History: Prior outpatient treatment  Past Medical History:  Past Medical History:  Diagnosis Date  . Anxiety   . Blindness of both eyes   . Complication of anesthesia   . Depression   . Elevated cholesterol   . GERD (gastroesophageal reflux disease)   . Headache   . Hypertension   . PONV (postoperative nausea and vomiting)   . Seizures ([place])    no seizures in over 10 yrs  . Skin cancer   . Sleep apnea    does not use CPAP  . Thyroid mass    left    Past Surgical History:  Procedure Laterality Date  . ABDOMINAL HYSTERECTOMY    . Bone spur removed from shoulder     . CHOLECYSTECTOMY    . ENUCLEATION Bilateral   . THYROIDECTOMY Left 11/07/2018   Procedure: LEFT HEMI THYROIDECTOMY;  Surgeon: Leta Baptist, MD;  Location: [facility] [place];  Service: ENT;  Laterality: Left;  . TONSILLECTOMY      Family Psychiatric History: see below  Family History:  Family History  Problem Relation Age of Onset  . Depression Paternal Aunt   . Alcohol abuse Paternal Uncle   . Drug abuse Paternal Uncle   . Depression Cousin   . Depression Maternal Aunt   . Colon cancer Mother   . Colon cancer Maternal Aunt   . Colon cancer Other     Social History:  Social History   Socioeconomic History  . Marital status: Married    Spouse  name: Not on file  . Number of children: 0  . Years of education: 62  . Highest education level: Not on file  Occupational History  . Occupation: Retired  Tobacco Use  . Smoking status: Former Smoker    Quit date: 01/03/1992    Years since quitting: 27.1  . Smokeless tobacco: Never Used  Substance and Sexual Activity  . Alcohol use: No    Alcohol/week: 0.0 standard drinks  . Drug use: No  . Sexual activity: Yes    Birth control/protection: Surgical  Other Topics Concern  . Not on file  Social History Narrative   POA-Dennis   Caffeine use: daily (1 soda per day, 1 coffee per day)   Left handed    Legally blind      Worked as LPN/RN for about 8 years   Social Determinants of Health   Financial Resource Strain:   . Difficulty of Paying Living Expenses: Not on file  Food Insecurity:   . Worried About Charity fundraiser in the Last Year: Not on file  . Ran Out of Food in the Last Year: Not on file  Transportation Needs:   . Lack of Transportation (Medical): Not on file  . Lack of Transportation (Non-Medical): Not on file  Physical Activity:   . Days of Exercise per Week: Not on file  . Minutes of Exercise per Session: Not on file  Stress:   . Feeling of Stress : Not on file  Social Connections:   . Frequency  of Communication with Friends and Family: Not on file  . Frequency of Social Gatherings with Friends and Family: Not on file  . Attends Religious Services: Not on file  . Active Member of Clubs or Organizations: Not on file  . Attends Archivist Meetings: Not on file  . Marital Status: Not on file    Allergies:  Allergies  Allergen Reactions  . Bacitracin Rash  . Neomycin Rash  . Sulfa Antibiotics Swelling and Rash  . Sulfamethoxazole Other (See Comments) and Swelling  . Eggs Or Egg-Derived Products Diarrhea    Stomach cramps  . Elavil [Amitriptyline Hcl] Other (See Comments)    Really bad tremors  . Gabapentin Other (See Comments)    Balance issues  . Prozac [Fluoxetine Hcl]     Severe headache    Metabolic Disorder Labs: No results found for: HGBA1C, MPG No results found for: PROLACTIN Lab Results  Component Value Date   CHOL 203 (H) 11/18/2017   TRIG 227 (H) 11/18/2017   HDL 51 11/18/2017   CHOLHDL 4.0 11/18/2017   LDLCALC 107 (H) 11/18/2017   Lab Results  Component Value Date   TSH 2.940 03/02/2018   TSH 1.56 10/26/2017    Therapeutic Level Labs: No results found for: LITHIUM No results found for: VALPROATE No components found for:  CBMZ  Current Medications: Current Outpatient Medications  Medication Sig Dispense Refill  . ALPRAZolam (XANAX) 1 MG tablet Take 1 tablet (1 mg total) by mouth at bedtime. 30 tablet 3  . CARBATROL 200 MG 12 hr capsule TAKE ONE CAPSULE BY MOUTH TWICE DAILY. 180 capsule 1  . DEXILANT 60 MG capsule TAKE ONE CAPSULE BY MOUTH DAILY; ON AN EMPTY STOMACH, THEN DO NOT EAT FOR ONE HOUR. (Patient taking differently: Take 60 mg by mouth daily before breakfast. ) 30 capsule 5  . DULoxetine (CYMBALTA) 20 MG capsule Take 1 capsule (20 mg total) by mouth daily. 30 capsule 2  . ECHINACEA  PO Take 1,520 mg by mouth daily. 760 mg per capsule    . HYDROcodone-acetaminophen (NORCO/VICODIN) 5-325 MG tablet Take 1 tablet by mouth every 6  (six) hours as needed for moderate pain.    . hydroxychloroquine (PLAQUENIL) 200 MG tablet Take 1 tablet (200 mg total) by mouth 2 (two) times daily. 180 tablet 1  . MAXALT 10 MG tablet Take 1 tablet (10 mg total) by mouth as needed. (Patient taking differently: Take 10 mg by mouth 3 (three) times daily as needed for migraine. ) 10 tablet 5  . methylPREDNISolone (MEDROL) 4 MG tablet Take 4 mg by mouth daily.    . metoprolol succinate (TOPROL-XL) 100 MG 24 hr tablet Take 1 tablet (100 mg total) by mouth every evening. 90 tablet 0  . NARCAN 4 MG/0.1ML LIQD nasal spray kit Place 1 spray into the nose as needed (accidental overdose.).     Marland Kitchen prednisoLONE acetate (PRED FORTE) 1 % ophthalmic suspension Place 1 drop into both eyes 2 (two) times daily.      No current facility-administered medications for this visit.     Musculoskeletal: Strength & Muscle Tone: within normal limits Gait & Station: normal Patient leans: N/A  Psychiatric Specialty Exam: Review of Systems  Musculoskeletal: Positive for arthralgias and myalgias.  Psychiatric/Behavioral: Positive for dysphoric mood and sleep disturbance.  All other systems reviewed and are negative.   There were no vitals taken for this visit.There is no height or weight on file to calculate BMI.  General Appearance: NA  Eye Contact:  None  Speech:  Clear and Coherent  Volume:  Normal  Mood:  Dysphoric  Affect:  NA  Thought Process:  Goal Directed  Orientation:  Full (Time, Place, and Person)  Thought Content: Rumination   Suicidal Thoughts:  No  Homicidal Thoughts:  No  Memory:  Immediate;   Good Recent;   Good Remote;   Good  Judgement:  Good  Insight:  Good  Psychomotor Activity:  Decreased  Concentration:  Concentration: Good and Attention Span: Good  Recall:  Good  Fund of Knowledge: Good  Language: Good  Akathisia:  No  Handed:  Right  AIMS (if indicated): not done  Assets:  Communication Skills Desire for  Improvement Resilience Social Support Talents/Skills  ADL's:  Intact  Cognition: WNL  Sleep:  Fair   Screenings: PHQ2-9     Office Visit from 03/18/2018 in [place] Office Visit from 02/16/2018 in [place] Visit from 11/16/2017 in [place]  PHQ-2 Total Score  3  3  0  PHQ-9 Total Score  11  12  --       Assessment and Plan: This patient is a 74 year old female with a history of congenital blindness, connective tissue disorder, depression and anxiety.  She has become more depressed due to her husband's alcoholism and neglect of her needs.  He seems to be doing better at the moment but I think she would benefit from Cymbalta 20 mg daily.  Risks and benefits have been explained.  She will also continue Xanax 1 mg at bedtime to help with anxiety and sleep.  She will return to see me in 4 weeks or call sooner as needed   [name], MD 03/06/2019, 2:27 PM

## 2019-03-09 DIAGNOSIS — G894 Chronic pain syndrome: Secondary | ICD-10-CM | POA: Diagnosis not present

## 2019-03-09 DIAGNOSIS — M545 Low back pain: Secondary | ICD-10-CM | POA: Diagnosis not present

## 2019-03-09 DIAGNOSIS — Z79899 Other long term (current) drug therapy: Secondary | ICD-10-CM | POA: Diagnosis not present

## 2019-03-09 DIAGNOSIS — M359 Systemic involvement of connective tissue, unspecified: Secondary | ICD-10-CM | POA: Diagnosis not present

## 2019-03-09 DIAGNOSIS — G8929 Other chronic pain: Secondary | ICD-10-CM | POA: Diagnosis not present

## 2019-03-14 ENCOUNTER — Ambulatory Visit (INDEPENDENT_AMBULATORY_CARE_PROVIDER_SITE_OTHER): Payer: Medicare Other | Admitting: Neurology

## 2019-03-14 ENCOUNTER — Telehealth: Payer: Self-pay | Admitting: Neurology

## 2019-03-14 ENCOUNTER — Other Ambulatory Visit: Payer: Self-pay

## 2019-03-14 ENCOUNTER — Encounter: Payer: Self-pay | Admitting: Neurology

## 2019-03-14 VITALS — BP 132/74 | HR 57 | Temp 97.4°F | Ht 66.0 in | Wt 181.6 lb

## 2019-03-14 DIAGNOSIS — R569 Unspecified convulsions: Secondary | ICD-10-CM | POA: Diagnosis not present

## 2019-03-14 DIAGNOSIS — G40409 Other generalized epilepsy and epileptic syndromes, not intractable, without status epilepticus: Secondary | ICD-10-CM | POA: Diagnosis not present

## 2019-03-14 MED ORDER — CARBATROL 200 MG PO CP12: 200.0000 mg | ORAL_CAPSULE | Freq: Every day | ORAL | 3 refills | Status: DC

## 2019-03-14 NOTE — Telephone Encounter (Signed)
I called Matt, at pharmacy.  Informed that carbatrol is 200mg  po daily (brand).  She has been taking for a while now. He verbalized understanding.

## 2019-03-14 NOTE — Progress Notes (Signed)
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  MRI of the brain has been unremarkable.  In the past, her seizure events have been described as staring off, she would drop things.  Her last seizure occurred 16 years ago.  She says in the interim she has been diagnosed with fibromyalgia.  She is blind, has artificial eyes. She has remained on lower dose of carbamazepine, as result of low sodium level.  She has done well with this.  She does require brand-name Carbatrol, when on generic, says she has breakthrough seizures.  She presents today for evaluation accompanied by her husband.  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  She had recent MRI of the brain that was unremarkable.  After her last visit her carbamazepine dose was reduced to 200 mg daily.  Laboratory evaluation revealed a low sodium level of 133. She has been referred to a pain center for chronic low back pain. She has not had any seizure type events for 16 years. Her seizure events in the past were described as staring off events, she would drop things. Her walking is stable. She needs a knee replacement. She denies any falls. She has artifical eyes. She had lab work 04/05/2018 her sodium 140.  She presents today for follow-up accompanied by her husband.   REVIEW OF SYSTEMS: Out of a complete 14 system review of symptoms, the patient complains only of the following symptoms, and all other reviewed systems are negative.  Seizures  ALLERGIES: Allergies  Allergen Reactions  . Bacitracin Rash  . Neomycin Rash  . Sulfa Antibiotics Swelling and Rash  . Sulfamethoxazole Other (See Comments) and Swelling    . Eggs Or Egg-Derived Products Diarrhea    Stomach cramps  . Elavil [Amitriptyline Hcl] Other (See Comments)    Really bad tremors  . Gabapentin Other (See Comments)    Balance issues  . Prozac [Fluoxetine Hcl]     Severe headache    HOME MEDICATIONS: Outpatient Medications Prior to Visit  Medication Sig Dispense Refill  . ALPRAZolam (XANAX) 1 MG tablet Take 1 tablet (1 mg total) by mouth at bedtime. 30 tablet 3  . DEXILANT 60 MG capsule TAKE ONE CAPSULE BY MOUTH DAILY; ON AN EMPTY STOMACH, THEN DO NOT EAT FOR ONE HOUR. (Patient taking differently: Take 60 mg by mouth daily before breakfast. ) 30 capsule 5  . DULoxetine (CYMBALTA) 20 MG capsule Take 1 capsule (20 mg total) by mouth daily. 30 capsule 2  . ECHINACEA PO Take 1,520 mg by mouth daily. 760 mg per capsule    . HYDROcodone-acetaminophen (NORCO/VICODIN) 5-325 MG tablet Take 1 tablet by mouth every 6 (six) hours as needed for moderate pain.    . hydroxychloroquine (PLAQUENIL) 200 MG tablet Take 1 tablet (200 mg total) by mouth 2 (two) times daily. 180 tablet 1  . MAXALT 10 MG tablet Take 1 tablet (10 mg total) by mouth as needed. (Patient taking differently: Take 10 mg by mouth 3 (three) times daily as needed for migraine. ) 10 tablet 5  . metoprolol succinate (TOPROL-XL) 100 MG 24 hr tablet Take 1 tablet (100 mg total) by mouth every evening. 90 tablet 0  . NARCAN 4  MG/0.1ML LIQD nasal spray kit Place 1 spray into the nose as needed (accidental overdose.).     Marland Kitchen CARBATROL 200 MG 12 hr capsule TAKE ONE CAPSULE BY MOUTH TWICE DAILY. (Patient taking differently: Take 200 mg by mouth daily. ) 180 capsule 1  . methylPREDNISolone (MEDROL) 4 MG tablet Take 4 mg by mouth daily.    . prednisoLONE acetate (PRED FORTE) 1 % ophthalmic suspension Place 1 drop into both eyes 2 (two) times daily.      No facility-administered medications prior to visit.    PAST MEDICAL HISTORY: Past Medical History:  Diagnosis Date  . Anxiety   .  Blindness of both eyes   . Complication of anesthesia   . Depression   . Elevated cholesterol   . GERD (gastroesophageal reflux disease)   . Headache   . Hypertension   . PONV (postoperative nausea and vomiting)   . Seizures ([place])    no seizures in over 10 yrs  . Skin cancer   . Sleep apnea    does not use CPAP  . Thyroid mass    left    PAST SURGICAL HISTORY: Past Surgical History:  Procedure Laterality Date  . ABDOMINAL HYSTERECTOMY    . Bone spur removed from shoulder    . CHOLECYSTECTOMY    . ENUCLEATION Bilateral   . THYROIDECTOMY Left 11/07/2018   Procedure: LEFT HEMI THYROIDECTOMY;  Surgeon: Leta Baptist, MD;  Location: [facility];  Service: ENT;  Laterality: Left;  . TONSILLECTOMY      FAMILY HISTORY: Family History  Problem Relation Age of Onset  . Depression Paternal Aunt   . Alcohol abuse Paternal Uncle   . Drug abuse Paternal Uncle   . Depression Cousin   . Depression Maternal Aunt   . Colon cancer Mother   . Colon cancer Maternal Aunt   . Colon cancer Other     SOCIAL HISTORY: Social History   Socioeconomic History  . Marital status: Married    Spouse name: Not on file  . Number of children: 0  . Years of education: 28  . Highest education level: Not on file  Occupational History  . Occupation: Retired  Tobacco Use  . Smoking status: Former Smoker    Quit date: 01/03/1992    Years since quitting: 27.2  . Smokeless tobacco: Never Used  Substance and Sexual Activity  . Alcohol use: No    Alcohol/week: 0.0 standard drinks  . Drug use: No  . Sexual activity: Yes    Birth control/protection: Surgical  Other Topics Concern  . Not on file  Social History Narrative   POA-Dennis   Caffeine use: daily (1 soda per day, 1 coffee per day)   Left handed    Legally blind      Worked as LPN/RN for about 8 years   Social Determinants of Health   Financial Resource Strain:   . Difficulty of Paying Living Expenses: Not on file  Food  Insecurity:   . Worried About Charity fundraiser in the Last Year: Not on file  . Ran Out of Food in the Last Year: Not on file  Transportation Needs:   . Lack of Transportation (Medical): Not on file  . Lack of Transportation (Non-Medical): Not on file  Physical Activity:   . Days of Exercise per Week: Not on file  . Minutes of Exercise per Session: Not on file  Stress:   . Feeling of Stress : Not on file  Social Connections:   . Frequency of Communication with Friends and Family: Not on file  . Frequency of Social Gatherings with Friends and Family: Not on file  . Attends Religious Services: Not on file  . Active Member of Clubs or Organizations: Not on file  . Attends Archivist Meetings: Not on file  . Marital Status: Not on file  Intimate Partner Violence:   . Fear of Current or Ex-Partner: Not on file  . Emotionally Abused: Not on file  . Physically Abused: Not on file  . Sexually Abused: Not on file  PHYSICAL EXAM  Vitals:   03/14/19 1333  BP: 132/74  Pulse: (!) 57  Temp: (!) 97.4 F (36.3 C)  TempSrc: Oral  Weight: 181 lb 9.6 oz (82.4 kg)  Height: '5\' 6"'$  (1.676 m)   Body mass index is 29.31 kg/m.  Generalized: Well developed, in no acute distress   Neurological examination  Mentation: Alert oriented to time, place, history taking. Follows all commands speech and language fluent Cranial nerve II-XII: She has artificial eyes, is blind.  Facial sensation and strength were normal. Head turning and shoulder shrug were normal and symmetric. Motor: Good strength of all extremities. Sensory: Sensory testing is intact to soft touch on all 4 extremities. No evidence of extinction is noted.  Gait and station: She is blind, gait is steady, uses her husband for guide Reflexes: Deep tendon reflexes are symmetric  DIAGNOSTIC DATA (LABS, IMAGING, TESTING) - I reviewed patient records, labs, notes, testing and imaging myself where available.  Lab Results   Component Value Date   WBC 9.5 03/02/2018   HGB 14.2 03/02/2018   HCT 41.0 03/02/2018   MCV 84 03/02/2018   PLT 312 03/02/2018      Component Value Date/Time   NA 133 (L) 03/02/2018 1555   K 4.1 03/02/2018 1555   CL 95 (L) 03/02/2018 1555   CO2 21 03/02/2018 1555   GLUCOSE 88 03/02/2018 1555   BUN 10 03/02/2018 1555   CREATININE 0.70 03/02/2018 1555   CALCIUM 9.7 03/02/2018 1555   PROT 7.1 03/02/2018 1555   ALBUMIN 4.7 03/02/2018 1555   AST 12 03/02/2018 1555   ALT 16 03/02/2018 1555   ALKPHOS 115 03/02/2018 1555   BILITOT 0.2 03/02/2018 1555   GFRNONAA 88 03/02/2018 1555   GFRAA 102 03/02/2018 1555   Lab Results  Component Value Date   CHOL 203 (H) 11/18/2017   HDL 51 11/18/2017   LDLCALC 107 (H) 11/18/2017   TRIG 227 (H) 11/18/2017   CHOLHDL 4.0 11/18/2017   No results found for: HGBA1C Lab Results  Component Value Date   VITAMINB12 542 03/02/2018   Lab Results  Component Value Date   TSH 2.940 03/02/2018      ASSESSMENT AND PLAN 74 y.o. year old female  has a past medical history of Anxiety, Blindness of both eyes, Complication of anesthesia, Depression, Elevated cholesterol, GERD (gastroesophageal reflux disease), Headache, Hypertension, PONV (postoperative nausea and vomiting), Seizures ([place]), Skin cancer, Sleep apnea, and Thyroid mass. here with:  1.  Seizure-like events  She has not had a seizure event in many years.  She has done well on lower dose of Carbatrol.  She will remain on Carbatrol 200 mg daily, she does require brand-name.  The dose was decreased as result of hyponatremia.  I will check routine lab work today.  She says at one time, Dr. Jannifer Franklin was treating her for migraines, there was a lapse in time when  she was not at our office, she will continue to get refills for metoprolol and Maxalt from PCP.  She will call for recurrent seizure, otherwise follow-up in 8 months or sooner if needed.   I spent 15 minutes with the patient. 50% of this  time was spent discussing her plan of care.   Butler Denmark, AGNP-C, DNP 03/14/2019, 2:14 PM [facility] [address], [place], [place] [address] [phone]

## 2019-03-14 NOTE — Telephone Encounter (Signed)
Amy from [facility] called needing clarification on the dosage for the pt's CARBATROL 200 MG 12 hr capsule Please advise.

## 2019-03-14 NOTE — Patient Instructions (Signed)
We will check lab work   Continue taking Carbatrol once daily for seizures   Call for recurrent seizure, see you back in 8 months

## 2019-03-14 NOTE — Progress Notes (Signed)
I have read the note, and I agree with the clinical assessment and plan.  Amica Harron K Brexton Sofia   

## 2019-03-15 ENCOUNTER — Telehealth: Payer: Self-pay

## 2019-03-15 LAB — CBC WITH DIFFERENTIAL/PLATELET
Basophils Absolute: 0 10*3/uL (ref 0.0–0.2)
Basos: 1 %
EOS (ABSOLUTE): 0.1 10*3/uL (ref 0.0–0.4)
Eos: 1 %
Hematocrit: 42.6 % (ref 34.0–46.6)
Hemoglobin: 14.2 g/dL (ref 11.1–15.9)
Immature Grans (Abs): 0 10*3/uL (ref 0.0–0.1)
Immature Granulocytes: 0 %
Lymphocytes Absolute: 2.3 10*3/uL (ref 0.7–3.1)
Lymphs: 27 %
MCH: 28.7 pg (ref 26.6–33.0)
MCHC: 33.3 g/dL (ref 31.5–35.7)
MCV: 86 fL (ref 79–97)
Monocytes Absolute: 0.7 10*3/uL (ref 0.1–0.9)
Monocytes: 9 %
Neutrophils Absolute: 5.3 10*3/uL (ref 1.4–7.0)
Neutrophils: 62 %
Platelets: 271 10*3/uL (ref 150–450)
RBC: 4.94 x10E6/uL (ref 3.77–5.28)
RDW: 12.9 % (ref 11.7–15.4)
WBC: 8.4 10*3/uL (ref 3.4–10.8)

## 2019-03-15 LAB — COMPREHENSIVE METABOLIC PANEL
ALT: 14 IU/L (ref 0–32)
AST: 15 IU/L (ref 0–40)
Albumin/Globulin Ratio: 1.8 (ref 1.2–2.2)
Albumin: 4.4 g/dL (ref 3.7–4.7)
Alkaline Phosphatase: 111 IU/L (ref 39–117)
BUN/Creatinine Ratio: 14 (ref 12–28)
BUN: 12 mg/dL (ref 8–27)
Bilirubin Total: 0.2 mg/dL (ref 0.0–1.2)
CO2: 24 mmol/L (ref 20–29)
Calcium: 9.2 mg/dL (ref 8.7–10.3)
Chloride: 102 mmol/L (ref 96–106)
Creatinine, Ser: 0.88 mg/dL (ref 0.57–1.00)
GFR calc Af Amer: 76 mL/min/{1.73_m2} (ref >59)
GFR calc non Af Amer: 66 mL/min/{1.73_m2} (ref >59)
Globulin, Total: 2.5 g/dL (ref 1.5–4.5)
Glucose: 82 mg/dL (ref 65–99)
Potassium: 4.2 mmol/L (ref 3.5–5.2)
Sodium: 141 mmol/L (ref 134–144)
Total Protein: 6.9 g/dL (ref 6.0–8.5)

## 2019-03-15 LAB — CARBAMAZEPINE LEVEL, TOTAL: Carbamazepine (Tegretol), S: 3.3 ug/mL — ABNORMAL LOW (ref 4.0–12.0)

## 2019-03-15 NOTE — Telephone Encounter (Signed)
Called to go over Pts recent labs per NP Grandville Silos request. No answer, left VM.   Please relay:  "Please call the patient. CBC was normal. CMP was normal, sodium was 141. Carbamazepine level was slightly low at 3.3. I would not alter dosing at this time, medication was reduced in the setting of hyponatremia. She has not had recurrent seizure." -NP SS

## 2019-03-15 NOTE — Telephone Encounter (Signed)
Pt has called Colletta Maryland back, please call pt

## 2019-03-16 ENCOUNTER — Ambulatory Visit (HOSPITAL_COMMUNITY): Payer: Medicare Other | Admitting: Psychiatry

## 2019-03-20 NOTE — Telephone Encounter (Signed)
Called Pt to go over recent labs. Per NP SS No answer left VM.  We keep missing each other by a slight margin.   Please Relay  "Please call the patient. CBC was normal. CMP was normal, sodium was 141. Carbamazepine level was slightly low at 3.3. I would not alter dosing at this time, medication was reduced in the setting of hyponatremia. She has not had recurrent seizure. "- NP Butler Denmark.

## 2019-03-20 NOTE — Telephone Encounter (Signed)
Patient called back and I was able to speak with her directly and relay the information over.  Pt had no questions nor concerns.

## 2019-03-30 ENCOUNTER — Ambulatory Visit (INDEPENDENT_AMBULATORY_CARE_PROVIDER_SITE_OTHER): Payer: Medicare Other | Admitting: Psychiatry

## 2019-03-30 ENCOUNTER — Other Ambulatory Visit: Payer: Self-pay

## 2019-03-30 ENCOUNTER — Encounter (HOSPITAL_COMMUNITY): Payer: Self-pay | Admitting: Psychiatry

## 2019-03-30 DIAGNOSIS — F321 Major depressive disorder, single episode, moderate: Secondary | ICD-10-CM | POA: Diagnosis not present

## 2019-03-30 NOTE — Progress Notes (Signed)
Virtual Visit via Telephone Note  I connected with Erica Bennett on 03/30/19 at  2:00 PM EST by telephone and verified that I am speaking with the correct person using two identifiers.   I discussed the limitations, risks, security and privacy concerns of performing an evaluation and management service by telephone and the availability of in person appointments. I also discussed with the patient that there may be a patient responsible charge related to this service. The patient expressed understanding and agreed to proceed.            I provided 45 minutes of non-face-to-face time during this encounter.   [name], LCSW           THERAPIST PRO      GRESS NOTE  Session Time: Thursday 03/30/2019 2:00 - 2:45 PM   Participation Level: Active  Behavioral Response: AlertAnxious  Type of Therapy: Individual Therapy  Treatment Goals addressed:  learn and implement cognitive and behavioral strategies to cope with anxiety and stress  Interventions: CBT and Supportive  Summary: Erica Bennett is a 74 y.o. female who  is self -referred and is a returning patient to this clinician. She last was seen in 2017. She denies any psychiatric hospitalizations. She continues to see psychiatrist Dr. Harrington Challenger  for medicatiion management.  Patient reports experiencing symptoms of anxiety including ruminating thoughts and sleep difficulty triggered by her husband's relapse.  He has a history of alcohol abuse/dependence.  Patient has been totally blind since her 73s.  She also recently was diagnosed with autoimmune disease per her report.    Patient last was seen via virtual visit about 4 weeks ago.  She reports feeling less depressed and less anxious.  She states worrying less and being less irritable.  She attributes this partially to experiencing decreased pain related to diagnosis of fibromyalgia as she recently began taking Cymbalta.  She reports husband continues to use alcohol but states she has tried  to respond to him differently when he is experiencing symptoms related to use.  When he becomes more paranoid per her report, she avoids having conversation with him and tries to engage in distracting and relaxation activities which have helped.  Patient also reports she has been using designated worry time and says this has helped as well.  However, she reports still  having ruminating thoughts beyond the designated worry time that interfere with her current activity.  She continues to express frustration regarding husband's alcohol use and fears increased use.  She worries about the consequences as well as possible shame and embarrassment.  Patient reports implementing plan of increased contact with ladies group at her church and reports enjoying this.  Suicidal/Homicidal: Nowithout intent/plan  Therapist Response:, reviewed symptoms, praised and reinforced patient's use of distracting activities/designated worry time, discussed effects and used cognitive defusion to help patient cope with ruminating thoughts that occur outside of worry time, developed plan with patient  to apply strategies used in session between sessions, discussed stressors, facilitated expression of thoughts and feelings, validated feelings, assisted patient examine her thought patterns of what if, assisted patient examine her patterns of coping with uncertainty and the effects on her current functioning,   Plan: Return again in 2-3  weeks.  Diagnosis: Axis I: Major Depression, single episode    Axis II: No diagnosis    [name], LCSW 03/30/2019

## 2019-04-01 DIAGNOSIS — Z23 Encounter for immunization: Secondary | ICD-10-CM | POA: Diagnosis not present

## 2019-04-03 ENCOUNTER — Ambulatory Visit (HOSPITAL_COMMUNITY): Payer: Medicare Other | Admitting: Psychiatry

## 2019-04-04 ENCOUNTER — Telehealth: Payer: Self-pay | Admitting: Neurology

## 2019-04-04 NOTE — Telephone Encounter (Signed)
Pt has called re: her MAXALT 10 MG tablet, pt states she was told by Judson Roch to call her if there were issues with her Primary Dr re: refills for her MAXALT 10 MG tablet.  Pt states she would like for Sarah,NP to fill both her MAXALT 10 MG tablet  and her   metoprolol succinate (TOPROL-XL) 100 MG 24 hr tablet(pt would like Sarah, NP to restart her on this  ) Please have called into [facility]

## 2019-04-04 NOTE — Telephone Encounter (Signed)
I called pt and she states that she has appt in June with her pcp, [facility].  They wanted to see her prior to new refills.  I relayed that I would see them and if they do not fill then we can see her about migraines, since now we see for seizures.  She will let us know.

## 2019-04-06 DIAGNOSIS — M359 Systemic involvement of connective tissue, unspecified: Secondary | ICD-10-CM | POA: Diagnosis not present

## 2019-04-06 DIAGNOSIS — Z79899 Other long term (current) drug therapy: Secondary | ICD-10-CM | POA: Diagnosis not present

## 2019-04-06 DIAGNOSIS — G894 Chronic pain syndrome: Secondary | ICD-10-CM | POA: Diagnosis not present

## 2019-04-10 ENCOUNTER — Encounter (HOSPITAL_COMMUNITY): Payer: Self-pay | Admitting: Psychiatry

## 2019-04-10 ENCOUNTER — Ambulatory Visit (INDEPENDENT_AMBULATORY_CARE_PROVIDER_SITE_OTHER): Payer: Medicare Other | Admitting: Psychiatry

## 2019-04-10 ENCOUNTER — Other Ambulatory Visit: Payer: Self-pay

## 2019-04-10 DIAGNOSIS — F321 Major depressive disorder, single episode, moderate: Secondary | ICD-10-CM

## 2019-04-10 DIAGNOSIS — Z87891 Personal history of nicotine dependence: Secondary | ICD-10-CM

## 2019-04-10 DIAGNOSIS — F418 Other specified anxiety disorders: Secondary | ICD-10-CM | POA: Diagnosis not present

## 2019-04-10 MED ORDER — DULOXETINE HCL 20 MG PO CPEP: 20.0000 mg | ORAL_CAPSULE | Freq: Every day | ORAL | 3 refills | Status: DC

## 2019-04-10 MED ORDER — ALPRAZOLAM 1 MG PO TABS: 1.0000 mg | ORAL_TABLET | Freq: Every day | ORAL | 3 refills | Status: DC

## 2019-04-10 NOTE — Progress Notes (Signed)
Virtual Visit via Telephone Note  I connected with Erica Bennett on 04/10/19 at  1:20 PM EDT by telephone and verified that I am speaking with the correct person using two identifiers.   I discussed the limitations, risks, security and privacy concerns of performing an evaluation and management service by telephone and the availability of in person appointments. I also discussed with the patient that there may be a patient responsible charge related to this service. The patient expressed understanding and agreed to proceed.     I discussed the assessment and treatment plan with the patient. The patient was provided an opportunity to ask questions and all were answered. The patient agreed with the plan and demonstrated an understanding of the instructions.   The patient was advised to call back or seek an in-person evaluation if the symptoms worsen or if the condition fails to improve as anticipated.  I provided 15 minutes of non-face-to-face time during this encounter.   [name], MD  [facility] MD/PA/NP OP Progress Note  04/10/2019 1:32 PM Erica Bennett  MRN:  [id number]  Chief Complaint:  Chief Complaint    Depression; Anxiety; Follow-up     HPI: this patient is a74 year old married white female who lives with her husband in [place]. They've been married for 14years. She has no children. She is on disability for congenital blindness.  The patient was referred by Dr.Hosanji, her primary physician, for further assessment of depression anxiety.  The patient states that her mother contracted rubella when she was pregnant with her. She developed congenital blindness that worsened in her 22s with severe macular degeneration. By her 110s she was totally blind. 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  She has  learned Braille and gets help through the [employer]. She was very comfortable doing things for herself. Both her parents are deceased and she doesn't have any other family. 11 years ago she reconnected with a man that she grown up with all her life. He had a history of alcoholism but claimed he had been sober for 5 years. They got married initially it went okay but then he began drinking. He goes through alcoholic bouts that can last months to years and he usually drinks about a half gallon of vodka a day. He last went through treatment last June but around March of this year he started drinking again. She felt very taken advantage of that she can't see what he is doing but she has been finding the bottles in confronting him and he is been lying about it. This is made her very uncomfortable. He is her power of attorney and they share a bank account. They've been arguing a lot more and she is become increasingly anxious and somewhat depressed.  The patient did have some prior treatment in her 39s when she went blind. She saw psychiatrist to help deal with the change and also took Paxil for a while. She's not had any treatment since. She's very active in Al-Anon and has several good friends from the program. She knows that she should detach from her alcoholic husband but it's difficult to do so because of her dependency on him.  The patient has always had difficulty sleeping because of the circadian problem with being blind. She has tried [place] and Ambien but  they didn't help and the newer drugs for this are extremely expensive. She is on a low dose of Xanax which is helped a little bit but she only gets about 3-4 hours of sleep a night. She is tired through the day, her energy is low. She denies suicidal ideation. She enjoys doing things with friends but will often walk him around because of her husbands behavior. She herself does not drink or use drugs and she's never had psychotic  symptoms  The patient returns after 4 weeks.  Last time she states that she had become more depressed.  This was in relation to her husband drinking again and ignoring her.  He has been doing better with this.  She is now on Cymbalta 20 mg daily and she states it has made a big difference.  For one thing it has helped her fibromyalgia and she is less tired and feels like doing more.  Also she feels less depressed and her anxiety is at a lower level.  She is sleeping well.  She denies significant depression or suicidal ideation.  The low-dose of Xanax at night also helps her to sleep.   Visit Diagnosis:    ICD-10-CM   1. Moderate single current episode of major depressive disorder ([place])  F32.1     Past Psychiatric History: Prior outpatient treatment  Past Medical History:  Past Medical History:  Diagnosis Date  . Anxiety   . Blindness of both eyes   . Complication of anesthesia   . Depression   . Elevated cholesterol   . GERD (gastroesophageal reflux disease)   . Headache   . Hypertension   . PONV (postoperative nausea and vomiting)   . Seizures ([place])    no seizures in over 10 yrs  . Skin cancer   . Sleep apnea    does not use CPAP  . Thyroid mass    left    Past Surgical History:  Procedure Laterality Date  . ABDOMINAL HYSTERECTOMY    . Bone spur removed from shoulder    . CHOLECYSTECTOMY    . ENUCLEATION Bilateral   . THYROIDECTOMY Left 11/07/2018   Procedure: LEFT HEMI THYROIDECTOMY;  Surgeon: Leta Baptist, MD;  Location: [place];  Service: ENT;  Laterality: Left;  . TONSILLECTOMY      Family Psychiatric History: see below  Family History:  Family History  Problem Relation Age of Onset  . Depression Paternal Aunt   . Alcohol abuse Paternal Uncle   . Drug abuse Paternal Uncle   . Depression Cousin   . Depression Maternal Aunt   . Colon cancer Mother   . Colon cancer Maternal Aunt   . Colon cancer Other     Social History:  Social History    Socioeconomic History  . Marital status: Married    Spouse name: Not on file  . Number of children: 0  . Years of education: 27  . Highest education level: Not on file  Occupational History  . Occupation: Retired  Tobacco Use  . Smoking status: Former Smoker    Quit date: 01/03/1992    Years since quitting: 27.2  . Smokeless tobacco: Never Used  Substance and Sexual Activity  . Alcohol use: No    Alcohol/week: 0.0 standard drinks  . Drug use: No  . Sexual activity: Yes    Birth control/protection: Surgical  Other Topics Concern  . Not on file  Social History Narrative   POA-[place]   Caffeine use: daily (  1 soda per day, 1 coffee per day)   Left handed    Legally blind      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 broadcast assistant Strain:   . Difficulty of Paying Living Expenses:   Food Insecurity:   . Worried About Charity fundraiser in the Last Year:   . Arboriculturist in the Last Year:   Transportation Needs:   . Film/video editor (Medical):   Marland Kitchen Lack of Transportation (Non-Medical):   Physical Activity:   . Days of Exercise per Week:   . Minutes of Exercise per Session:   Stress:   . Feeling of Stress :   Social Connections:   . Frequency of Communication with Friends and Family:   . Frequency of Social Gatherings with Friends and Family:   . Attends Religious Services:   . Active Member of Clubs or Organizations:   . Attends Archivist Meetings:   Marland Kitchen Marital Status:     Allergies:  Allergies  Allergen Reactions  . Bacitracin Rash  . Neomycin Rash  . Sulfa Antibiotics Swelling and Rash  . Sulfamethoxazole Other (See Comments) and Swelling  . Eggs Or Egg-Derived Products Diarrhea    Stomach cramps  . Elavil [Amitriptyline Hcl] Other (See Comments)    Really bad tremors  . Gabapentin Other (See Comments)    Balance issues  . Prozac [Fluoxetine Hcl]     Severe headache    Metabolic Disorder Labs: No  results found for: HGBA1C, MPG No results found for: PROLACTIN Lab Results  Component Value Date   CHOL 203 (H) 11/18/2017   TRIG 227 (H) 11/18/2017   HDL 51 11/18/2017   CHOLHDL 4.0 11/18/2017   LDLCALC 107 (H) 11/18/2017   Lab Results  Component Value Date   TSH 2.940 03/02/2018   TSH 1.56 10/26/2017    Therapeutic Level Labs: No results found for: LITHIUM No results found for: VALPROATE No components found for:  CBMZ  Current Medications: Current Outpatient Medications  Medication Sig Dispense Refill  . ALPRAZolam (XANAX) 1 MG tablet Take 1 tablet (1 mg total) by mouth at bedtime. 30 tablet 3  . CARBATROL 200 MG 12 hr capsule Take 1 capsule (200 mg total) by mouth daily. 90 capsule 3  . DEXILANT 60 MG capsule TAKE ONE CAPSULE BY MOUTH DAILY; ON AN EMPTY STOMACH, THEN DO NOT EAT FOR ONE HOUR. (Patient taking differently: Take 60 mg by mouth daily before breakfast. ) 30 capsule 5  . DULoxetine (CYMBALTA) 20 MG capsule Take 1 capsule (20 mg total) by mouth daily. 30 capsule 3  . ECHINACEA PO Take 1,520 mg by mouth daily. 760 mg per capsule    . HYDROcodone-acetaminophen (NORCO/VICODIN) 5-325 MG tablet Take 1 tablet by mouth every 6 (six) hours as needed for moderate pain.    . hydroxychloroquine (PLAQUENIL) 200 MG tablet Take 1 tablet (200 mg total) by mouth 2 (two) times daily. 180 tablet 1  . MAXALT 10 MG tablet Take 1 tablet (10 mg total) by mouth as needed. (Patient taking differently: Take 10 mg by mouth 3 (three) times daily as needed for migraine. ) 10 tablet 5  . metoprolol succinate (TOPROL-XL) 100 MG 24 hr tablet Take 1 tablet (100 mg total) by mouth every evening. 90 tablet 0  . NARCAN 4 MG/0.1ML LIQD nasal spray kit Place 1 spray into the nose as needed (accidental overdose.).  No current facility-administered medications for this visit.     Musculoskeletal: Strength & Muscle Tone: within normal limits Gait & Station: normal Patient leans: N/A  Psychiatric  Specialty Exam: Review of Systems  Musculoskeletal: Positive for arthralgias and myalgias.  All other systems reviewed and are negative.   There were no vitals taken for this visit.There is no height or weight on file to calculate BMI.  General Appearance: NA  Eye Contact:  pt is blind  Speech:  Clear and Coherent  Volume:  Normal  Mood:  Euthymic  Affect:  NA  Thought Process:  Goal Directed  Orientation:  Full (Time, Place, and Person)  Thought Content: WDL   Suicidal Thoughts:  No  Homicidal Thoughts:  No  Memory:  Immediate;   Good Recent;   Good Remote;   Good  Judgement:  Good  Insight:  Good  Psychomotor Activity:  Decreased  Concentration:  Concentration: Good and Attention Span: Good  Recall:  Good  Fund of Knowledge: Good  Language: Good  Akathisia:  No  Handed:  Right  AIMS (if indicated): not done  Assets:  Communication Skills Desire for Improvement Resilience Social Support Talents/Skills  ADL's:  Intact  Cognition: WNL  Sleep:  Good   Screenings: GAD-7     Counselor from 03/30/2019 in [facility]  Total GAD-7 Score  2    PHQ2-9     Counselor from 03/30/2019 in [facility] Office Visit from 03/18/2018 in [facility] Office Visit from 02/16/2018 in [facility] Visit from 11/16/2017 in [facility]  PHQ-2 Total Score  0  3  3  0  PHQ-9 Total Score  --  11  12  --       Assessment and Plan: This patient is a 74 year old female with a history of congenital blindness, connective tissue disorder depression and anxiety.  She seems to be benefiting from Cymbalta 20 mg daily so this will be continued.  She will also continue Xanax 1 mg at bedtime to help with anxiety and sleep.  She will return to see me in 4 months   [name], MD 04/10/2019, 1:32 PM

## 2019-04-12 ENCOUNTER — Other Ambulatory Visit: Payer: Self-pay

## 2019-04-12 ENCOUNTER — Ambulatory Visit (INDEPENDENT_AMBULATORY_CARE_PROVIDER_SITE_OTHER): Payer: Medicare Other | Admitting: Psychiatry

## 2019-04-12 DIAGNOSIS — F321 Major depressive disorder, single episode, moderate: Secondary | ICD-10-CM

## 2019-04-12 NOTE — Progress Notes (Signed)
Virtual Visit via Telephone Note  I connected with Erica Bennett on 04/12/19 at 1:10 PM EDTby telephone and verified that I am speaking with the correct person using two identifiers.   I discussed the limitations, risks, security and privacy concerns of performing an evaluation and management service by telephone and the availability of in person appointments. I also discussed with the patient that there may be a patient responsible charge related to this service. The patient expressed understanding and agreed to proceed.   I provided 45 minutes of non-face-to-face time during this encounter.   [name], LCSW            THERAPIST PRO      GRESS NOTE  Session Time: Wednesday 04/12/2019 1:10 PM - 1:55 PM  Participation Level: Active  Behavioral Response: AlertAnxious  Type of Therapy: Individual Therapy  Treatment Goals addressed:  learn and implement cognitive and behavioral strategies to cope with anxiety and stress  Interventions: CBT and Supportive  Summary: Erica Bennett is a 74 y.o. female who  is self -referred and is a returning patient to this clinician. She last was seen in 2017. She denies any psychiatric hospitalizations. She continues to see psychiatrist Dr. Harrington Challenger  for medicatiion management.  Patient reports experiencing symptoms of anxiety including ruminating thoughts and sleep difficulty triggered by her husband's relapse.  He has a history of alcohol abuse/dependence.  Patient has been totally blind since her 73s.  She also recently was diagnosed with autoimmune disease per her report.    Patient last was seen via virtual visit about 2 weeks ago.  She continues to experience moderate symptoms of depression and anxiety. She reports continued worry but decreased intensity and frequency.  Per patient's report she has been using the designated worry time which has been helpful.  She reports worry thoughts tend to now be more contained during that time rather than  invading her day.  However she continues to have a lot of what if thoughts.  She reports anxiety has increased as husband's drinking has increased for the past 3 days.  She says he has indicated a desire to get help for drinking issues.  Patient reports she is still enjoying contact with ladies group at church. Suicidal/Homicidal: Nowithout intent/plan  Therapist Response:, reviewed symptoms, praised and reinforced patient's use of distracting activities/designated worry time, discussed effects, also discussed other questions to consider during designated worry time regarding the probability of feared events and problem solving regarding worst-case scenarios regarding her capabilities and response, discussed possible resources for patient's husband through [facility] including chemical dependency IOP, provided patient with contact information, praised and reinforced patient's continued involvement with ladies group, developed plan with patient to use strategies discussed for worry time and continued use of cognitive defusion for what if thoughts beyond designated worry time  Plan: Return again in 2-3  weeks.  Diagnosis: Axis I: Major Depression, single episode    Axis II: No diagnosis    [name], LCSW 04/12/2019

## 2019-04-14 DIAGNOSIS — Z1159 Encounter for screening for other viral diseases: Secondary | ICD-10-CM | POA: Diagnosis not present

## 2019-04-14 DIAGNOSIS — E7800 Pure hypercholesterolemia, unspecified: Secondary | ICD-10-CM | POA: Diagnosis not present

## 2019-04-14 DIAGNOSIS — Z0000 Encounter for general adult medical examination without abnormal findings: Secondary | ICD-10-CM | POA: Diagnosis not present

## 2019-04-14 DIAGNOSIS — I10 Essential (primary) hypertension: Secondary | ICD-10-CM | POA: Diagnosis not present

## 2019-04-14 DIAGNOSIS — E890 Postprocedural hypothyroidism: Secondary | ICD-10-CM | POA: Diagnosis not present

## 2019-04-14 DIAGNOSIS — R7303 Prediabetes: Secondary | ICD-10-CM | POA: Diagnosis not present

## 2019-04-14 DIAGNOSIS — E559 Vitamin D deficiency, unspecified: Secondary | ICD-10-CM | POA: Diagnosis not present

## 2019-05-08 DIAGNOSIS — M359 Systemic involvement of connective tissue, unspecified: Secondary | ICD-10-CM | POA: Diagnosis not present

## 2019-05-08 DIAGNOSIS — Z79899 Other long term (current) drug therapy: Secondary | ICD-10-CM | POA: Diagnosis not present

## 2019-05-08 DIAGNOSIS — G894 Chronic pain syndrome: Secondary | ICD-10-CM | POA: Diagnosis not present

## 2019-05-09 ENCOUNTER — Ambulatory Visit (INDEPENDENT_AMBULATORY_CARE_PROVIDER_SITE_OTHER): Payer: Medicare Other | Admitting: Psychiatry

## 2019-05-09 ENCOUNTER — Encounter (HOSPITAL_COMMUNITY): Payer: Self-pay | Admitting: Psychiatry

## 2019-05-09 ENCOUNTER — Other Ambulatory Visit: Payer: Self-pay

## 2019-05-09 DIAGNOSIS — F321 Major depressive disorder, single episode, moderate: Secondary | ICD-10-CM

## 2019-05-09 NOTE — Progress Notes (Signed)
Virtual Visit via Telephone Note  I connected with [name] on 05/09/19 at 2:10 PM EDT  by telephone and verified that I am speaking with the correct person using two identifiers.   I discussed the limitations, risks, security and privacy concerns of performing an evaluation and management service by telephone and the availability of in person appointments. I also discussed with the patient that there may be a patient responsible charge related to this service. The patient expressed understanding and agreed to proceed.   I provided 45 minutes of non-face-to-face time during this encounter.   [name], LCSW           THERAPIST PRO      GRESS NOTE  Session Time: Tuesday 05/09/2019 2:10 PM -2:55 PM   Participation Level: Active  Behavioral Response: AlertAnxious  Type of Therapy: Individual Therapy  Treatment Goals addressed:  learn and implement cognitive and behavioral strategies to cope with anxiety and stress  Interventions: CBT and Supportive  Summary: Erica Bennett is a 74 y.o. female who  is self -referred and is a returning patient to this clinician. She last was seen in 2017. She denies any psychiatric hospitalizations. She continues to see psychiatrist Dr. Harrington Challenger  for medicatiion management.  Patient reports experiencing symptoms of anxiety including ruminating thoughts and sleep difficulty triggered by her husband's relapse.  He has a history of alcohol abuse/dependence.  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  She also recently was diagnosed with autoimmune disease per her report.    Patient last was seen via virtual visit about 3 weeks ago.  She continues to experience moderate symptoms of depression and anxiety. She reports increased worry about husband as his alcohol use has increased.  She also expresses frustration husband was informed he did not meet requirements for participation in chemical dependency IOP group through [facility] per his  report.  Patient fears there is no help for husband and worries he may drive intoxicated.  She reports she has notified police in the past when he has left the house intoxicated but says police could not find him.  She reports trying to use distracting activities as well as trying to stay in contact with her ladies group to help cope.  She also continues to try to use designated worry time and reports this is helpful at times.   Suicidal/Homicidal: Nowithout intent/plan  Therapist Response:, reviewed symptoms, discussed stressors, facilitated expression of thoughts and feelings, validated feelings, praised and reinforced patient's use of distracting activities/designated worry time, discussed effects, discussed other possible resources for husband and  ways for patient to express concerns to husband assertively, encouraged patient to maintain contact with positive sources of energy including her ladies group, encouraged patient to continue to use cognitive defusion to cope with what if thoughts beyond designated worry time  Plan: Return again in 2-3  weeks.  Diagnosis: Axis I: Major Depression, single episode    Axis II: No diagnosis    [name], LCSW 05/09/2019

## 2019-05-17 ENCOUNTER — Telehealth: Payer: Self-pay

## 2019-05-17 NOTE — Telephone Encounter (Signed)
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

## 2019-05-18 NOTE — Telephone Encounter (Signed)
PA FOR Carbatrol 200mg  12 hr has been approved through 01/26/20 Faxed to pharmacy

## 2019-05-31 ENCOUNTER — Other Ambulatory Visit: Payer: Self-pay

## 2019-05-31 ENCOUNTER — Ambulatory Visit (INDEPENDENT_AMBULATORY_CARE_PROVIDER_SITE_OTHER): Payer: Medicare Other | Admitting: Psychiatry

## 2019-05-31 DIAGNOSIS — F321 Major depressive disorder, single episode, moderate: Secondary | ICD-10-CM

## 2019-05-31 NOTE — Progress Notes (Signed)
Virtual Visit via Telephone Note  I connected with [name] on 05/31/19 at  2:00 PM EDT by telephone and verified that I am speaking with the correct person using two identifiers.   I discussed the limitations, risks, security and privacy concerns of performing an evaluation and management service by telephone and the availability of in person appointments. I also discussed with the patient that there may be a patient responsible charge related to this service. The patient expressed understanding and agreed to proceed.  I provided 49 minutes of non-face-to-face time during this encounter.   [name], LCSW            THERAPIST PROGRESS NOTE  Session Time:     Wednesday 05/31/2019 2:00 PM - 2:49 PM           Participation Level: Active  Behavioral Response: AlertAnxious  Type of Therapy: Individual Therapy  Treatment Goals addressed:  learn and implement cognitive and behavioral strategies to cope with anxiety and stress  Interventions: CBT and Supportive  Summary: Erica Bennett is a 74 y.o. female who  is self -referred and is a returning patient to this clinician. She last was seen in 2017. She denies any psychiatric hospitalizations. She continues to see psychiatrist Dr. Harrington Challenger  for medicatiion management.  Patient reports experiencing symptoms of anxiety including ruminating thoughts and sleep difficulty triggered by her husband's relapse.  He has a history of alcohol abuse/dependence.  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  She also recently was diagnosed with autoimmune disease per her report.    Patient last was seen via virtual visit about 3 weeks ago.  She is experiencing minimal to moderate depression and anxiety.  She reports less worry about her husband as she states increased acceptance of his situation.  He still has been unable to find treatment for alcohol abuse/dependence issues but currently is on the waiting list to see a therapist per her report.   She states feeling as though a weight has been lifted as she reports now focusing on self rather than spending time worrying about his issues.  She reports feeling better physically as Cymbalta has helped reduce her physical pain.  She reports also improved memory and concentration.  She is more involved and other activities and has resumed doing household tasks such as vacuuming, washing the dishes, and cleaning out the shower.  She also reports being less irritated by husband's behavior and also reports now being able to disengage to concentrate on self when he is negative.  She continues to use other coping strategies discussed in therapy and reports these remain helpful.  Suicidal/Homicidal: Nowithout intent/plan  Therapist Response:, reviewed symptoms, praised and reinforced patient's increased acceptance, discussed effects,  praised and reinforced patient's use of distracting activities/designated worry time, discussed effects, began to discuss lapse versus relapse, will continue to discuss this along with ways to avoid relapse at next session   Plan: Return again in 2-3  weeks.  Diagnosis: Axis I: Major Depression, single episode    Axis II: No diagnosis    [name], LCSW 05/31/2019

## 2019-06-07 DIAGNOSIS — Z79899 Other long term (current) drug therapy: Secondary | ICD-10-CM | POA: Diagnosis not present

## 2019-06-07 DIAGNOSIS — M2550 Pain in unspecified joint: Secondary | ICD-10-CM | POA: Diagnosis not present

## 2019-06-07 DIAGNOSIS — G894 Chronic pain syndrome: Secondary | ICD-10-CM | POA: Diagnosis not present

## 2019-06-21 DIAGNOSIS — Z01419 Encounter for gynecological examination (general) (routine) without abnormal findings: Secondary | ICD-10-CM | POA: Diagnosis not present

## 2019-06-21 DIAGNOSIS — Z124 Encounter for screening for malignant neoplasm of cervix: Secondary | ICD-10-CM | POA: Diagnosis not present

## 2019-06-21 DIAGNOSIS — Z113 Encounter for screening for infections with a predominantly sexual mode of transmission: Secondary | ICD-10-CM | POA: Diagnosis not present

## 2019-06-27 ENCOUNTER — Telehealth (HOSPITAL_COMMUNITY): Payer: Self-pay | Admitting: Psychiatry

## 2019-06-27 ENCOUNTER — Other Ambulatory Visit: Payer: Self-pay

## 2019-06-27 ENCOUNTER — Ambulatory Visit (HOSPITAL_COMMUNITY): Payer: Medicare Other | Admitting: Psychiatry

## 2019-06-27 NOTE — Telephone Encounter (Signed)
Therapist called patient, left message indicating attempt to contact patient for scheduled appointment and requesting patient call office.

## 2019-07-07 DIAGNOSIS — Z79899 Other long term (current) drug therapy: Secondary | ICD-10-CM | POA: Diagnosis not present

## 2019-07-07 DIAGNOSIS — M2550 Pain in unspecified joint: Secondary | ICD-10-CM | POA: Diagnosis not present

## 2019-07-07 DIAGNOSIS — R197 Diarrhea, unspecified: Secondary | ICD-10-CM | POA: Diagnosis not present

## 2019-07-07 DIAGNOSIS — G894 Chronic pain syndrome: Secondary | ICD-10-CM | POA: Diagnosis not present

## 2019-07-18 ENCOUNTER — Ambulatory Visit (INDEPENDENT_AMBULATORY_CARE_PROVIDER_SITE_OTHER): Payer: Medicare Other | Admitting: Psychiatry

## 2019-07-18 ENCOUNTER — Other Ambulatory Visit: Payer: Self-pay

## 2019-07-18 DIAGNOSIS — F321 Major depressive disorder, single episode, moderate: Secondary | ICD-10-CM

## 2019-07-18 NOTE — Progress Notes (Signed)
Virtual Visit via Telephone Note  I connected with [name] on 07/18/19 at  2:00 PM EDT by telephone and verified that I am speaking with the correct person using two identifiers.   I discussed the limitations, risks, security and privacy concerns of performing an evaluation and management service by telephone and the availability of in person appointments. I also discussed with the patient that there may be a patient responsible charge related to this service. The patient expressed understanding and agreed to proceed.     I provided 50  minutes of non-face-to-face time during this encounter.   [name], LCSW             THERAPIST PROGRESS NOTE   Location:  Patient - Home/ Provider - [place] officed  Session Time:   Tuesday 07/18/2019 2:00 PM- 2:50 PM           Participation Level: Active  Behavioral Response: AlertAnxious  Type of Therapy: Individual Therapy  Treatment Goals addressed:  learn and implement cognitive and behavioral strategies to cope with anxiety and stress  Interventions: CBT and Supportive  Summary: Erica Bennett is a 74 y.o. female who  is self -referred and is a returning patient to this clinician. She last was seen in 2017. She denies any psychiatric hospitalizations. She continues to see psychiatrist Dr. Harrington Challenger  for medicatiion management.  Patient reports experiencing symptoms of anxiety including ruminating thoughts and sleep difficulty triggered by her husband's relapse.  He has a history of alcohol abuse/dependence.  Patient has been totally blind since her 33s.  She also recently was diagnosed with autoimmune disease per her report.    Patient last was seen via virtual visit about 6 weeks ago.  She is experiencing minimal depression and anxiety.  Her husband continues to use alcohol but patient reports less anxiety and worry.  She has been using helpful coping strategies consistently and these include designated worry  time, use of distracting activities, deep breathing, and increased involvement and positive relationships.  She reports continuing to feel better physically and maintains involvement in various activities.  Suicidal/Homicidal: Nowithout intent/plan  Therapist Response:, reviewed symptoms, praised and reinforced patient's increased acceptance and use of helpful coping strategies, discussed lapse versus relapse of depression, assisted patient identify early warning signs of depression and strategies to intervene, discussed patient's progress in treatment and her strengths in coping with adversity, processed patient's feelings about termination, discussed stepdown plan to termination to include 2 more sessions (mindfulness skills, mental health maintenance plan)  Plan: Return again in 2-3  weeks.  Diagnosis: Axis I: Major Depression, single episode    Axis II: No diagnosis    [name], LCSW 07/18/2019

## 2019-08-10 ENCOUNTER — Other Ambulatory Visit: Payer: Self-pay

## 2019-08-10 ENCOUNTER — Encounter (HOSPITAL_COMMUNITY): Payer: Self-pay | Admitting: Psychiatry

## 2019-08-10 ENCOUNTER — Ambulatory Visit (INDEPENDENT_AMBULATORY_CARE_PROVIDER_SITE_OTHER): Payer: Medicare Other | Admitting: Psychiatry

## 2019-08-10 DIAGNOSIS — F321 Major depressive disorder, single episode, moderate: Secondary | ICD-10-CM

## 2019-08-10 MED ORDER — ALPRAZOLAM 1 MG PO TABS: 1.0000 mg | ORAL_TABLET | Freq: Every day | ORAL | 3 refills | Status: DC

## 2019-08-10 MED ORDER — DULOXETINE HCL 30 MG PO CPEP: 30.0000 mg | ORAL_CAPSULE | Freq: Every day | ORAL | 2 refills | Status: DC

## 2019-08-10 NOTE — Progress Notes (Signed)
Virtual Visit via Telephone Note  I connected with Erica Bennett on 08/10/19 at  2:00 PM EDT by telephone and verified that I am speaking with the correct person using two identifiers.   I discussed the limitations, risks, security and privacy concerns of performing an evaluation and management service by telephone and the availability of in person appointments. I also discussed with the patient that there may be a patient responsible charge related to this service. The patient expressed understanding and agreed to proceed.    I discussed the assessment and treatment plan with the patient. The patient was provided an opportunity to ask questions and all were answered. The patient agreed with the plan and demonstrated an understanding of the instructions.   The patient was advised to call back or seek an in-person evaluation if the symptoms worsen or if the condition fails to improve as anticipated.  I provided 15 minutes of non-face-to-face time during this encounter. Location: Provider home, patient home  [name], MD  [facility] MD/PA/NP OP Progress Note  08/10/2019 2:38 PM Erica Bennett  MRN:  [id number]  Chief Complaint:  Chief Complaint    Depression; Anxiety; Follow-up     IRC:VELF patient is a74 year old married white female who lives with her husband in [place]. They've been married for 14years. She has no children. She is on disability for congenital blindness.  The patient was referred by Dr.Hosanji, her primary physician, for further assessment of depression anxiety.  The patient states that her mother contracted rubella when she was pregnant with her. She developed congenital blindness that worsened in her 38s with severe macular degeneration. By her 72s she was totally blind. 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  She has learned Braille and gets help through the [employer]. She was very comfortable doing things for herself. Both her parents are deceased and she doesn't have any other family. 11 years ago she reconnected with a man that she grown up with all her life. He had a history of alcoholism but claimed he had been sober for 5 years. They got married initially it went okay but then he began drinking. He goes through alcoholic bouts that can last months to years and he usually drinks about a half gallon of vodka a day. He last went through treatment last June but around March of this year he started drinking again. She felt very taken advantage of that she can't see what he is doing but she has been finding the bottles in confronting him and he is been lying about it. This is made her very uncomfortable. He is her power of attorney and they share a bank account. They've been arguing a lot more and she is become increasingly anxious and somewhat depressed.  The patient did have some prior treatment in her 2s when she went blind. She saw psychiatrist to help deal with the change and also took Paxil for a while. She's not had any treatment since. She's very active in Al-Anon and has several good friends from the program. She knows that she should detach from her alcoholic husband but it's difficult to do so because of her dependency on him.  The patient has always had difficulty sleeping because of the circadian problem with being blind. She has tried [place] and  Ambien but they didn't help and the newer drugs for this are extremely expensive. She is on a low dose of Xanax which is helped a little bit but she only gets about 3-4 hours of sleep a night. She is tired through the day, her energy is low. She denies suicidal ideation. She enjoys doing things with friends but will often walk him around because of her husbands behavior. She herself does not drink or use drugs and she's  never had psychotic symptoms   The patient returns for follow-up after about 4 months.  She states that her husband recently was evaluated for prostate cancer but all his biopsies were negative.  His PSA has been elevated.  She states that he is drinking heavily again.  He often gets belligerent and nasty when he drinks but he is not physically violent.  She states she "just waits for him to drink enough to fall asleep."  Somehow she is managing with this lifestyle but she is thinking that if it gets bad enough she will leave.  This is not an appropriate setting for someone who is blind in my opinion I have told her so.  The patient however states that she is not worried about her safety at this point.  The Cymbalta has helped her depression and chronic pain and she asked if she can go up a little bit more since she has developed fibromyalgia.  She denies serious depression or suicidal ideation and she is sleeping well with the Xanax at bedtime Visit Diagnosis:    ICD-10-CM   1. Moderate single current episode of major depressive disorder ([place])  F32.1     Past Psychiatric History: Prior outpatient treatment  Past Medical History:  Past Medical History:  Diagnosis Date  . Anxiety   . Blindness of both eyes   . Complication of anesthesia   . Depression   . Elevated cholesterol   . GERD (gastroesophageal reflux disease)   . Headache   . Hypertension   . PONV (postoperative nausea and vomiting)   . Seizures ([place])    no seizures in over 10 yrs  . Skin cancer   . Sleep apnea    does not use CPAP  . Thyroid mass    left    Past Surgical History:  Procedure Laterality Date  . ABDOMINAL HYSTERECTOMY    . Bone spur removed from shoulder    . CHOLECYSTECTOMY    . ENUCLEATION Bilateral   . THYROIDECTOMY Left 11/07/2018   Procedure: LEFT HEMI THYROIDECTOMY;  Surgeon: Leta Baptist, MD;  Location: [facility];  Service: ENT;  Laterality: Left;  . TONSILLECTOMY      Family  Psychiatric History: see below  Family History:  Family History  Problem Relation Age of Onset  . Depression Paternal Aunt   . Alcohol abuse Paternal Uncle   . Drug abuse Paternal Uncle   . Depression Cousin   . Depression Maternal Aunt   . Colon cancer Mother   . Colon cancer Maternal Aunt   . Colon cancer Other     Social History:  Social History   Socioeconomic History  . Marital status: Married    Spouse name: Not on file  . Number of children: 0  . Years of education: 28  . Highest education level: Not on file  Occupational History  . Occupation: Retired  Tobacco Use  . Smoking status: Former Smoker    Quit date: 01/03/1992    Years since quitting:  27.6  . Smokeless tobacco: Never Used  Vaping Use  . Vaping Use: Never used  Substance and Sexual Activity  . Alcohol use: No    Alcohol/week: 0.0 standard drinks  . Drug use: No  . Sexual activity: Yes    Birth control/protection: Surgical  Other Topics Concern  . Not on file  Social History Narrative   POA-[place]   Caffeine use: daily (1 soda per day, 1 coffee per day)   Left handed    Legally blind      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 broadcast assistant Strain:   . Difficulty of Paying Living Expenses:   Food Insecurity:   . Worried About Charity fundraiser in the Last Year:   . Arboriculturist in the Last Year:   Transportation Needs:   . Film/video editor (Medical):   Marland Kitchen Lack of Transportation (Non-Medical):   Physical Activity:   . Days of Exercise per Week:   . Minutes of Exercise per Session:   Stress:   . Feeling of Stress :   Social Connections:   . Frequency of Communication with Friends and Family:   . Frequency of Social Gatherings with Friends and Family:   . Attends Religious Services:   . Active Member of Clubs or Organizations:   . Attends Archivist Meetings:   Marland Kitchen Marital Status:     Allergies:  Allergies  Allergen Reactions   . Bacitracin Rash  . Neomycin Rash  . Sulfa Antibiotics Swelling and Rash  . Sulfamethoxazole Other (See Comments) and Swelling  . Eggs Or Egg-Derived Products Diarrhea    Stomach cramps  . Elavil [Amitriptyline Hcl] Other (See Comments)    Really bad tremors  . Gabapentin Other (See Comments)    Balance issues  . Prozac [Fluoxetine Hcl]     Severe headache    Metabolic Disorder Labs: No results found for: HGBA1C, MPG No results found for: PROLACTIN Lab Results  Component Value Date   CHOL 203 (H) 11/18/2017   TRIG 227 (H) 11/18/2017   HDL 51 11/18/2017   CHOLHDL 4.0 11/18/2017   LDLCALC 107 (H) 11/18/2017   Lab Results  Component Value Date   TSH 2.940 03/02/2018   TSH 1.56 10/26/2017    Therapeutic Level Labs: No results found for: LITHIUM No results found for: VALPROATE No components found for:  CBMZ  Current Medications: Current Outpatient Medications  Medication Sig Dispense Refill  . ALPRAZolam (XANAX) 1 MG tablet Take 1 tablet (1 mg total) by mouth at bedtime. 30 tablet 3  . CARBATROL 200 MG 12 hr capsule Take 1 capsule (200 mg total) by mouth daily. 90 capsule 3  . DEXILANT 60 MG capsule TAKE ONE CAPSULE BY MOUTH DAILY; ON AN EMPTY STOMACH, THEN DO NOT EAT FOR ONE HOUR. (Patient taking differently: Take 60 mg by mouth daily before breakfast. ) 30 capsule 5  . DULoxetine (CYMBALTA) 30 MG capsule Take 1 capsule (30 mg total) by mouth daily. 30 capsule 2  . ECHINACEA PO Take 1,520 mg by mouth daily. 760 mg per capsule    . HYDROcodone-acetaminophen (NORCO/VICODIN) 5-325 MG tablet Take 1 tablet by mouth every 6 (six) hours as needed for moderate pain.    . hydroxychloroquine (PLAQUENIL) 200 MG tablet Take 1 tablet (200 mg total) by mouth 2 (two) times daily. 180 tablet 1  . MAXALT 10 MG tablet Take 1 tablet (10 mg  total) by mouth as needed. (Patient taking differently: Take 10 mg by mouth 3 (three) times daily as needed for migraine. ) 10 tablet 5  . metoprolol  succinate (TOPROL-XL) 100 MG 24 hr tablet Take 1 tablet (100 mg total) by mouth every evening. 90 tablet 0  . NARCAN 4 MG/0.1ML LIQD nasal spray kit Place 1 spray into the nose as needed (accidental overdose.).      No current facility-administered medications for this visit.     Musculoskeletal: Strength & Muscle Tone: within normal limits Gait & Station: normal Patient leans: N/A  Psychiatric Specialty Exam: Review of Systems  Eyes: Positive for visual disturbance.  Musculoskeletal: Positive for arthralgias and myalgias.  All other systems reviewed and are negative.   There were no vitals taken for this visit.There is no height or weight on file to calculate BMI.  General Appearance: NA  Eye Contact:  NA  Speech:  Clear and Coherent  Volume:  Normal  Mood:  Euthymic  Affect:  NA  Thought Process:  Goal Directed  Orientation:  Full (Time, Place, and Person)  Thought Content: Rumination   Suicidal Thoughts:  No  Homicidal Thoughts:  No  Memory:  Immediate;   Good Recent;   Good Remote;   Fair  Judgement:  Good  Insight:  Good  Psychomotor Activity:  Decreased  Concentration:  Concentration: Good and Attention Span: Good  Recall:  Good  Fund of Knowledge: Good  Language: Good  Akathisia:  No  Handed:  Right  AIMS (if indicated): not done  Assets:  Communication Skills Desire for Improvement Resilience Social Support Talents/Skills  ADL's:  Intact  Cognition: WNL  Sleep:  Good   Screenings: GAD-7     Counselor from 03/30/2019 in [facility]  Total GAD-7 Score 2    PHQ2-9     Counselor from 03/30/2019 in [facility] Office Visit from 03/18/2018 in [facility] Office Visit from 02/16/2018 in [facility] Visit from 11/16/2017 in [facility]  PHQ-2 Total Score 0 3 3 0  PHQ-9 Total Score -- 11 12 --        Assessment and Plan: This patient is a 74 year old female with a history of congenital blindness connective tissue disorder depression and anxiety.  She is benefiting from Cymbalta but still has some fibromyalgia symptoms so we will increase it to 30 mg daily.  She will continue Xanax 1 mg at bedtime to help with anxiety and sleep.  She will return to see me in 2 months   [name], MD 08/10/2019, 2:38 PM

## 2019-08-11 DIAGNOSIS — Z79899 Other long term (current) drug therapy: Secondary | ICD-10-CM | POA: Diagnosis not present

## 2019-08-11 DIAGNOSIS — M2550 Pain in unspecified joint: Secondary | ICD-10-CM | POA: Diagnosis not present

## 2019-08-11 DIAGNOSIS — G894 Chronic pain syndrome: Secondary | ICD-10-CM | POA: Diagnosis not present

## 2019-08-14 DIAGNOSIS — E663 Overweight: Secondary | ICD-10-CM | POA: Diagnosis not present

## 2019-08-14 DIAGNOSIS — M25561 Pain in right knee: Secondary | ICD-10-CM | POA: Diagnosis not present

## 2019-08-14 DIAGNOSIS — R21 Rash and other nonspecific skin eruption: Secondary | ICD-10-CM | POA: Diagnosis not present

## 2019-08-14 DIAGNOSIS — M797 Fibromyalgia: Secondary | ICD-10-CM | POA: Diagnosis not present

## 2019-08-14 DIAGNOSIS — Z6828 Body mass index (BMI) 28.0-28.9, adult: Secondary | ICD-10-CM | POA: Diagnosis not present

## 2019-08-14 DIAGNOSIS — M7989 Other specified soft tissue disorders: Secondary | ICD-10-CM | POA: Diagnosis not present

## 2019-08-14 DIAGNOSIS — M2550 Pain in unspecified joint: Secondary | ICD-10-CM | POA: Diagnosis not present

## 2019-08-14 DIAGNOSIS — M359 Systemic involvement of connective tissue, unspecified: Secondary | ICD-10-CM | POA: Diagnosis not present

## 2019-08-16 DIAGNOSIS — R600 Localized edema: Secondary | ICD-10-CM | POA: Diagnosis not present

## 2019-08-16 DIAGNOSIS — M7989 Other specified soft tissue disorders: Secondary | ICD-10-CM | POA: Diagnosis not present

## 2019-08-17 ENCOUNTER — Ambulatory Visit (INDEPENDENT_AMBULATORY_CARE_PROVIDER_SITE_OTHER): Payer: Medicare Other | Admitting: Psychiatry

## 2019-08-17 ENCOUNTER — Other Ambulatory Visit: Payer: Self-pay

## 2019-08-17 DIAGNOSIS — F321 Major depressive disorder, single episode, moderate: Secondary | ICD-10-CM | POA: Diagnosis not present

## 2019-08-17 NOTE — Progress Notes (Signed)
Virtual Visit via Telephone Note  I connected with Erica Bennett on 08/17/19 at 1:10 PM EDT  by telephone and verified that I am speaking with the correct person using two identifiers.   I discussed the limitations, risks, security and privacy concerns of performing an evaluation and management service by telephone and the availability of in person appointments. I also discussed with the patient that there may be a patient responsible charge related to this service. The patient expressed understanding and agreed to proceed.   I provided 48 minutes of non-face-to-face time during this encounter.   [name], LCSW              THERAPIST PROGRESS NOTE   Location:  Patient - Home/ Provider - [place] officed  Session Time:   Thursday 08/17/2019 1:10 PM -  1:58 PM   Participation Level: Active  Behavioral Response: AlertAnxious  Type of Therapy: Individual Therapy  Treatment Goals addressed:  learn and implement cognitive and behavioral strategies to cope with anxiety and stress  Interventions: CBT and Supportive  Summary: Erica Bennett is a 74 y.o. female who  is self -referred and is a returning patient to this clinician. She last was seen in 2017. She denies any psychiatric hospitalizations. She continues to see psychiatrist Dr. Harrington Challenger  for medicatiion management.  Patient reports experiencing symptoms of anxiety including ruminating thoughts and sleep difficulty triggered by her husband's relapse.  He has a history of alcohol abuse/dependence.  Patient has been totally blind since her 68s.  She also recently was diagnosed with autoimmune disease per her report.    Patient last was seen via virtual visit about 4 weeks ago.  She is experiencing minimal depression and anxiety.  However, patient reports increased stress triggered by her 77 year old step granddaughter along with her 16 year old boyfriend moving in with patient and her husband a week ago.  This has  disrupted patient's and husband's routine causing sleep difficulty.  Patient also reports her home is overcrowded as they have only 1 bedroom,.  The couple is sleeping on air mattress in the den.  Patient reports she and her husband hardly have any privacy.  The couple also has behavior challenges and patient fears the boyfriend will lose his job.  She and her husband both are concerned about how long the situation may last.  However, patient and her husband have discussed this and are in agreement about setting and maintaining limits.  Patient reports coping fairly well at this point and is using deep breathing as relaxation technique.  She reports husband continues to drink but has not been excessive.  Suicidal/Homicidal: Nowithout intent/plan  Therapist Response:, reviewed symptoms, discussed stressors, facilitated expression of thoughts and feelings, validated feelings, praised and reinforced patient's communication with husband/setting and maintaining limits/using helpful coping techniques, began to discuss mindfulness and the window of tolerance, discussed rationale for use of mindfulness and the window of tolerance to avoid relapse of depression, assisted patient identify and practice grounding techniques, developed plan with patient to practice a grounding technique daily between sessions  Plan: Return again in 2-3  weeks.  Diagnosis: Axis I: Major Depression, single episode    Axis II: No diagnosis    [name], LCSW 08/17/2019

## 2019-08-30 ENCOUNTER — Ambulatory Visit (INDEPENDENT_AMBULATORY_CARE_PROVIDER_SITE_OTHER): Payer: Medicare Other | Admitting: Psychiatry

## 2019-08-30 ENCOUNTER — Other Ambulatory Visit: Payer: Self-pay

## 2019-08-30 DIAGNOSIS — F321 Major depressive disorder, single episode, moderate: Secondary | ICD-10-CM

## 2019-08-30 NOTE — Progress Notes (Signed)
Virtual Visit via Telephone Note  I connected with Erica Bennett on 08/30/19 at 3:08 PM EDT  by telephone and verified that I am speaking with the correct person using two identifiers.   I discussed the limitations, risks, security and privacy concerns of performing an evaluation and management service by telephone and the availability of in person appointments. I also discussed with the patient that there may be a patient responsible charge related to this service. The patient expressed understanding and agreed to proceed.  I provided 47 minutes of non-face-to-face time during this encounter.   [name], LCSW              THERAPIST PROGRESS NOTE   Location:  Patient - Home/ Provider - [place] officed  Session Time:   Wednesday 08/30/2019 3:08 PM - 3:55 PM  Participation Level: Active  Behavioral Response: AlertAnxious  Type of Therapy: Individual Therapy  Treatment Goals addressed:  learn and implement cognitive and behavioral strategies to cope with anxiety and stress  Interventions: CBT and Supportive  Summary: Erica Bennett is a 74 y.o. female who  is self -referred and is a returning patient to this clinician. She last was seen in 2017. She denies any psychiatric hospitalizations. She continues to see psychiatrist Dr. Harrington Challenger  for medicatiion management.  Patient reports experiencing symptoms of anxiety including ruminating thoughts and sleep difficulty triggered by her husband's relapse.  He has a history of alcohol abuse/dependence.  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  She also recently was diagnosed with autoimmune disease per her report.    Patient last was seen via virtual visit about 3-4 weeks ago.  She is experiencing minimal depression and anxiety.  She reports decreased stress as her step granddaughter and the boyfriend have returned to their own home.  However she is experiencing increased stress due to flareup of her autoimmune  disease.  She just started a 12-day prednisone regimen.  She is walking with a cane now due to the swelling in her legs, ankles, and hips.  However, she is coping fairly well.  She reports husband has continued drinking alcohol.  She reports not being overwhelmed by this as she is focusing on self.  She reports becoming more mindful and recognizing her window of tolerance.  Patient reports she has been using grounding techniques.   Suicidal/Homicidal: Nowithout intent/plan  Therapist Response:, reviewed symptoms, discussed stressors, facilitated expression of thoughts and feelings, validated feelings, praised and reinforced patient's efforts to become more mindful and her use of grounding techniques, discussed effects, assisted patient identify and practice mindfulness activities (eating mindfully, breath awareness) to improve mindfulness skills, developed plan with patient to practice a mindfulness activity daily, processed patient's feelings about upcoming termination, will plan to terminate at next session  Plan: Return again in 2-3  weeks.  Diagnosis: Axis I: Major Depression, single episode    Axis II: No diagnosis    [name], LCSW 08/30/2019

## 2019-08-31 DIAGNOSIS — Z1231 Encounter for screening mammogram for malignant neoplasm of breast: Secondary | ICD-10-CM | POA: Diagnosis not present

## 2019-09-05 DIAGNOSIS — Z79899 Other long term (current) drug therapy: Secondary | ICD-10-CM | POA: Diagnosis not present

## 2019-09-05 DIAGNOSIS — M2550 Pain in unspecified joint: Secondary | ICD-10-CM | POA: Diagnosis not present

## 2019-09-05 DIAGNOSIS — G894 Chronic pain syndrome: Secondary | ICD-10-CM | POA: Diagnosis not present

## 2019-09-06 ENCOUNTER — Telehealth (HOSPITAL_COMMUNITY): Payer: Self-pay | Admitting: Psychiatry

## 2019-09-06 NOTE — Telephone Encounter (Signed)
Called to reschedule appt due to provider being out of the office, left detailed voice message

## 2019-09-14 ENCOUNTER — Ambulatory Visit (HOSPITAL_COMMUNITY): Payer: Medicare Other | Admitting: Psychiatry

## 2019-09-16 DIAGNOSIS — R159 Full incontinence of feces: Secondary | ICD-10-CM | POA: Diagnosis not present

## 2019-09-16 DIAGNOSIS — Z86010 Personal history of colonic polyps: Secondary | ICD-10-CM | POA: Diagnosis not present

## 2019-09-16 DIAGNOSIS — Z8371 Family history of colonic polyps: Secondary | ICD-10-CM | POA: Diagnosis not present

## 2019-09-16 DIAGNOSIS — K529 Noninfective gastroenteritis and colitis, unspecified: Secondary | ICD-10-CM | POA: Diagnosis not present

## 2019-10-03 ENCOUNTER — Other Ambulatory Visit: Payer: Self-pay

## 2019-10-03 ENCOUNTER — Ambulatory Visit (INDEPENDENT_AMBULATORY_CARE_PROVIDER_SITE_OTHER): Payer: Medicare Other | Admitting: Psychiatry

## 2019-10-03 DIAGNOSIS — F321 Major depressive disorder, single episode, moderate: Secondary | ICD-10-CM | POA: Diagnosis not present

## 2019-10-03 NOTE — Progress Notes (Addendum)
Virtual Visit via Telephone Note  I connected with Erica Bennett on 10/03/19 at  1:00 PM EDT by telephone and verified that I am speaking with the correct person using two identifiers.   I discussed the limitations, risks, security and privacy concerns of performing an evaluation and management service by telephone and the availability of in person appointments. I also discussed with the patient that there may be a patient responsible charge related to this service. The patient expressed understanding and agreed to proceed.   I provided 47  minutes of non-face-to-face time during this encounter.   [name], LCSW               THERAPIST PROGRESS NOTE   Location:  Patient - Home/ Provider - [place] office    Session Time:   Tuesday 10/03/2019 1:00 PM - 1:47 PM   Participation Level: Active  Behavioral Response: AlertAnxious  Type of Therapy: Individual Therapy  Treatment Goals addressed:  learn and implement cognitive and behavioral strategies to cope with anxiety and stress  Interventions: CBT and Supportive  Summary: Erica Bennett is a 74 y.o. female who  is self -referred and is a returning patient to this clinician. She last was seen in 2017. She denies any psychiatric hospitalizations. She continues to see psychiatrist Dr. Harrington Challenger  for medicatiion management.  Patient reports experiencing symptoms of anxiety including ruminating thoughts and sleep difficulty triggered by her husband's relapse.  He has a history of alcohol abuse/dependence.  Patient has been totally blind since her 40s.  She also recently was diagnosed with autoimmune disease per her report.    Patient last was seen via virtual visit about 3-4 weeks ago.  She is experiencing minimal depression and anxiety.  She states being on an even keel.  She reports improved interaction with husband and says has been has been more supportive.  She continues to suspect he drinks but still is not  overwhelmed by this.  She has been practicing mindfulness strategies and reports this has been helpful.  She expresses confidence in her ability to continue using helpful coping strategies effectively.     Suicidal/Homicidal: Nowithout intent/plan   Therapist Response:, reviewed symptoms, facilitated expression of thoughts and feelings, validated feelings, praised and reinforced patient's use of helpful coping strategies, discussed effects, assisted patient develop mental health maintenance plan, did termination, encourage patient to contact this practice should she need psychotherapy services in the future, patient will continue to see psychiatrist Dr. Harrington Challenger for medication management   Plan:   Diagnosis: Axis I: Major Depression, single episode    Axis II: No diagnosis    [name], LCSW 10/03/2019   Outpatient Therapist Discharge Summary  Erica Bennett    [birth year]   Admission Date: 01/03/2019  Discharge Date:  10/03/2019 Reason for Discharge:Treatment completed Diagnosis:  Axis I:  Moderate single current episode of major depressive disorder [facility])   Comments: Patient will continue to see psychiatrist Dr. Harrington Challenger for medication management.  Patient is encouraged to contact this practice should she need psychotherapy services in the future.  Enslie Sahota E Tareka Jhaveri LCSW

## 2019-10-04 ENCOUNTER — Telehealth (HOSPITAL_COMMUNITY): Payer: Medicare Other | Admitting: Psychiatry

## 2019-10-04 ENCOUNTER — Other Ambulatory Visit: Payer: Self-pay

## 2019-10-05 DIAGNOSIS — R197 Diarrhea, unspecified: Secondary | ICD-10-CM | POA: Diagnosis not present

## 2019-10-05 DIAGNOSIS — K635 Polyp of colon: Secondary | ICD-10-CM | POA: Diagnosis not present

## 2019-10-05 DIAGNOSIS — Z01818 Encounter for other preprocedural examination: Secondary | ICD-10-CM | POA: Diagnosis not present

## 2019-10-06 ENCOUNTER — Other Ambulatory Visit (HOSPITAL_COMMUNITY): Payer: Self-pay | Admitting: Psychiatry

## 2019-10-06 DIAGNOSIS — M2550 Pain in unspecified joint: Secondary | ICD-10-CM | POA: Diagnosis not present

## 2019-10-06 DIAGNOSIS — Z79899 Other long term (current) drug therapy: Secondary | ICD-10-CM | POA: Diagnosis not present

## 2019-10-06 DIAGNOSIS — G894 Chronic pain syndrome: Secondary | ICD-10-CM | POA: Diagnosis not present

## 2019-10-09 ENCOUNTER — Encounter (HOSPITAL_COMMUNITY): Payer: Self-pay | Admitting: Psychiatry

## 2019-10-09 ENCOUNTER — Telehealth (INDEPENDENT_AMBULATORY_CARE_PROVIDER_SITE_OTHER): Payer: Medicare Other | Admitting: Psychiatry

## 2019-10-09 ENCOUNTER — Other Ambulatory Visit: Payer: Self-pay

## 2019-10-09 DIAGNOSIS — F321 Major depressive disorder, single episode, moderate: Secondary | ICD-10-CM

## 2019-10-09 MED ORDER — ALPRAZOLAM 1 MG PO TABS: 1.0000 mg | ORAL_TABLET | Freq: Every day | ORAL | 3 refills | Status: DC

## 2019-10-09 NOTE — Progress Notes (Signed)
Virtual Visit via Telephone Note  I connected with Erica Bennett on 10/09/19 at 11:00 AM EDT by telephone and verified that I am speaking with the correct person using two identifiers.   I discussed the limitations, risks, security and privacy concerns of performing an evaluation and management service by telephone and the availability of in person appointments. I also discussed with the patient that there may be a patient responsible charge related to this service. The patient expressed understanding and agreed to proceed.    I discussed the assessment and treatment plan with the patient. The patient was provided an opportunity to ask questions and all were answered. The patient agreed with the plan and demonstrated an understanding of the instructions.   The patient was advised to call back or seek an in-person evaluation if the symptoms worsen or if the condition fails to improve as anticipated.  I provided 15 minutes of non-face-to-face time during this encounter. Location: Provider office, patient home  Erica Spiller, MD  [facility] MD/PA/NP OP Progress Note  10/09/2019 11:18 AM Erica Bennett  MRN:  [id number]  Chief Complaint:  Chief Complaint    Depression; Anxiety; Follow-up     HPI: this patient is a74 year old married white female who lives with her husband in [place]. They've been married for 14years. She has no children. She is on disability for congenital blindness.  The patient was referred by Dr.Hosanji, her primary physician, for further assessment of depression anxiety.  The patient states that her mother contracted rubella when she was pregnant with her. She developed congenital blindness that worsened in her 72s with severe macular degeneration. By her 74s she was totally blind. 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  She has learned Braille and gets help through the [employer]. She was very comfortable doing things for herself. Both her parents are deceased and she doesn't have any other family. 11 years ago she reconnected with a man that she grown up with all her life. He had a history of alcoholism but claimed he had been sober for 5 years. They got married initially it went okay but then he began drinking. He goes through alcoholic bouts that can last months to years and he usually drinks about a half gallon of vodka a day. He last went through treatment last June but around March of this year he started drinking again. She felt very taken advantage of that she can't see what he is doing but she has been finding the bottles in confronting him and he is been lying about it. This is made her very uncomfortable. He is her power of attorney and they share a bank account. They've been arguing a lot more and she is become increasingly anxious and somewhat depressed.  The patient did have some prior treatment in her 74s when she went blind. She saw psychiatrist to help deal with the change and also took Paxil for a while. She's not had any treatment since. She's very active in [employer]-An[employer] and has several good friends from the program. She knows that she should detach from her alcoholic husband but it's difficult to do so because of her dependency on him.  The patient has always had difficulty sleeping because of the circadian problem with being blind. She has tried [place] and  Ambien but they didn't help and the newer drugs for this are extremely expensive. She is on a low dose of Xanax which is helped a little bit but she only gets about 3-4 hours of sleep a night. She is tired through the day, her energy is low. She denies suicidal ideation. She enjoys doing things with friends but will often walk him around because of her husbands behavior. She herself does not drink or use drugs and  she's never had psychotic symptoms   The patient returns for follow-up after 2 months.  She states that overall she is doing well.  She underwent a colonoscopy last week which was rough as the preparation caused her to get dehydrated.  She is still suffering from fibromyalgia and rheumatoid arthritis but does think that the Cymbalta has helped to some degree to alleviate some pain and help her mood.  She is sleeping well with the Xanax.  Her husband is still drinking but not as much and he seems a lot easier to live with than he has in recent months.  She denies depressed mood or thoughts of self-harm or suicidal ideation. Visit Diagnosis:    ICD-10-CM   1. Moderate single current episode of major depressive disorder ([place])  F32.1     Past Psychiatric History: Prior outpatient treatment  Past Medical History:  Past Medical History:  Diagnosis Date  . Anxiety   . Blindness of both eyes   . Complication of anesthesia   . Depression   . Elevated cholesterol   . GERD (gastroesophageal reflux disease)   . Headache   . Hypertension   . PONV (postoperative nausea and vomiting)   . Seizures ([place])    no seizures in over 10 yrs  . Skin cancer   . Sleep apnea    does not use CPAP  . Thyroid mass    left    Past Surgical History:  Procedure Laterality Date  . ABDOMINAL HYSTERECTOMY    . Bone spur removed from shoulder    . CHOLECYSTECTOMY    . ENUCLEATION Bilateral   . THYROIDECTOMY Left 11/07/2018   Procedure: LEFT HEMI THYROIDECTOMY;  Surgeon: Leta Baptist, MD;  Location: [facility];  Service: ENT;  Laterality: Left;  . TONSILLECTOMY      Family Psychiatric History: see below  Family History:  Family History  Problem Relation Age of Onset  . Depression Paternal Aunt   . Alcohol abuse Paternal Uncle   . Drug abuse Paternal Uncle   . Depression Cousin   . Depression Maternal Aunt   . Colon cancer Mother   . Colon cancer Maternal Aunt   . Colon cancer Other      Social History:  Social History   Socioeconomic History  . Marital status: Married    Spouse name: Not on file  . Number of children: 0  . Years of education: 18  . Highest education level: Not on file  Occupational History  . Occupation: Retired  Tobacco Use  . Smoking status: Former Smoker    Quit date: 01/03/1992    Years since quitting: 27.7  . Smokeless tobacco: Never Used  Vaping Use  . Vaping Use: Never used  Substance and Sexual Activity  . Alcohol use: No    Alcohol/week: 0.0 standard drinks  . Drug use: No  . Sexual activity: Yes    Birth control/protection: Surgical  Other Topics Concern  . Not on file  Social History Narrative   [employer]  Caffeine use: daily (1 soda per day, 1 coffee per day)   Left handed    Legally blind      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 broadcast assistant Strain:   . Difficulty of Paying Living Expenses: Not on file  Food Insecurity:   . Worried About Charity fundraiser in the Last Year: Not on file  . Ran Out of Food in the Last Year: Not on file  Transportation Needs:   . Lack of Transportation (Medical): Not on file  . Lack of Transportation (Non-Medical): Not on file  Physical Activity:   . Days of Exercise per Week: Not on file  . Minutes of Exercise per Session: Not on file  Stress:   . Feeling of Stress : Not on file  Social Connections:   . Frequency of Communication with Friends and Family: Not on file  . Frequency of Social Gatherings with Friends and Family: Not on file  . Attends Religious Services: Not on file  . Active Member of Clubs or Organizations: Not on file  . Attends Archivist Meetings: Not on file  . Marital Status: Not on file    Allergies:  Allergies  Allergen Reactions  . Bacitracin Rash  . Neomycin Rash  . Sulfa Antibiotics Swelling and Rash  . Sulfamethoxazole Other (See Comments) and Swelling  . Eggs Or Egg-Derived Products Diarrhea     Stomach cramps  . Elavil [Amitriptyline Hcl] Other (See Comments)    Really bad tremors  . Gabapentin Other (See Comments)    Balance issues  . Prozac [Fluoxetine Hcl]     Severe headache    Metabolic Disorder Labs: No results found for: HGBA1C, MPG No results found for: PROLACTIN Lab Results  Component Value Date   CHOL 203 (H) 11/18/2017   TRIG 227 (H) 11/18/2017   HDL 51 11/18/2017   CHOLHDL 4.0 11/18/2017   LDLCALC 107 (H) 11/18/2017   Lab Results  Component Value Date   TSH 2.940 03/02/2018   TSH 1.56 10/26/2017    Therapeutic Level Labs: No results found for: LITHIUM No results found for: VALPROATE No components found for:  CBMZ  Current Medications: Current Outpatient Medications  Medication Sig Dispense Refill  . ALPRAZolam (XANAX) 1 MG tablet Take 1 tablet (1 mg total) by mouth at bedtime. 30 tablet 3  . CARBATROL 200 MG 12 hr capsule Take 1 capsule (200 mg total) by mouth daily. 90 capsule 3  . DEXILANT 60 MG capsule TAKE ONE CAPSULE BY MOUTH DAILY; ON AN EMPTY STOMACH, THEN DO NOT EAT FOR ONE HOUR. (Patient taking differently: Take 60 mg by mouth daily before breakfast. ) 30 capsule 5  . DULoxetine (CYMBALTA) 30 MG capsule TAKE ONE CAPSULE BY MOUTH ONCE DAILY 30 capsule 2  . ECHINACEA PO Take 1,520 mg by mouth daily. 760 mg per capsule    . HYDROcodone-acetaminophen (NORCO/VICODIN) 5-325 MG tablet Take 1 tablet by mouth every 6 (six) hours as needed for moderate pain.    . hydroxychloroquine (PLAQUENIL) 200 MG tablet Take 1 tablet (200 mg total) by mouth 2 (two) times daily. 180 tablet 1  . MAXALT 10 MG tablet Take 1 tablet (10 mg total) by mouth as needed. (Patient taking differently: Take 10 mg by mouth 3 (three) times daily as needed for migraine. ) 10 tablet 5  . metoprolol succinate (TOPROL-XL) 100 MG 24 hr tablet Take 1 tablet (100 mg total)  by mouth every evening. 90 tablet 0  . NARCAN 4 MG/0.1ML LIQD nasal spray kit Place 1 spray into the nose as  needed (accidental overdose.).      No current facility-administered medications for this visit.     Musculoskeletal: Strength & Muscle Tone: within normal limits Gait & Station: normal Patient leans: N/A  Psychiatric Specialty Exam: Review of Systems  Eyes: Positive for visual disturbance.  Musculoskeletal: Positive for arthralgias and myalgias.  All other systems reviewed and are negative.   There were no vitals taken for this visit.There is no height or weight on file to calculate BMI.  General Appearance: NA  Eye Contact:  pt is blind  Speech:  Clear and Coherent  Volume:  Normal  Mood:  Euthymic  Affect:  NA  Thought Process:  Goal Directed  Orientation:  Full (Time, Place, and Person)  Thought Content: WDL   Suicidal Thoughts:  No  Homicidal Thoughts:  No  Memory:  Immediate;   Good Recent;   Good Remote;   Good  Judgement:  Good  Insight:  Good  Psychomotor Activity:  Decreased  Concentration:  Concentration: Good and Attention Span: Good  Recall:  Good  Fund of Knowledge: Good  Language: Good  Akathisia:  No  Handed:  Right  AIMS (if indicated): not done  Assets:  Communication Skills Desire for Improvement Resilience Social Support Talents/Skills  ADL's:  Intact  Cognition: WNL  Sleep:  Good   Screenings: GAD-7     Counselor from 03/30/2019 in [facility]  Total GAD-7 Score 2    PHQ2-9     Counselor from 03/30/2019 in [place] [facility] Visit from 03/18/2018 in [facility] [facility] Visit from 02/16/2018 in [facility] Visit from 11/16/2017 in [facility]  PHQ-2 Total Score 0 3 3 0  PHQ-9 Total Score -- 11 12 --       Assessment and Plan: This patient is a 74 year old female with a history of congenital blindness, connective tissue disorder fibromyalgia depression and anxiety.  She states that  she is doing fairly well on her current regimen although she still has arthritic symptoms.  She will continue Cymbalta 30 mg daily for depression and chronic pain as well as Xanax 1 mg at bedtime to help with anxiety and sleep.  She will return to see me in 3 months   Erica Spiller, MD 10/09/2019, 11:18 AM

## 2019-10-10 DIAGNOSIS — K529 Noninfective gastroenteritis and colitis, unspecified: Secondary | ICD-10-CM | POA: Diagnosis not present

## 2019-10-12 DIAGNOSIS — K635 Polyp of colon: Secondary | ICD-10-CM | POA: Diagnosis not present

## 2019-10-12 DIAGNOSIS — K529 Noninfective gastroenteritis and colitis, unspecified: Secondary | ICD-10-CM | POA: Diagnosis not present

## 2019-10-16 ENCOUNTER — Telehealth (HOSPITAL_COMMUNITY): Payer: Self-pay | Admitting: *Deleted

## 2019-10-16 ENCOUNTER — Other Ambulatory Visit (HOSPITAL_COMMUNITY): Payer: Self-pay | Admitting: Psychiatry

## 2019-10-16 DIAGNOSIS — I10 Essential (primary) hypertension: Secondary | ICD-10-CM | POA: Diagnosis not present

## 2019-10-16 DIAGNOSIS — E7800 Pure hypercholesterolemia, unspecified: Secondary | ICD-10-CM | POA: Diagnosis not present

## 2019-10-16 DIAGNOSIS — R7303 Prediabetes: Secondary | ICD-10-CM | POA: Diagnosis not present

## 2019-10-16 DIAGNOSIS — E559 Vitamin D deficiency, unspecified: Secondary | ICD-10-CM | POA: Diagnosis not present

## 2019-10-16 MED ORDER — DULOXETINE HCL 20 MG PO CPEP: 20.0000 mg | ORAL_CAPSULE | Freq: Every day | ORAL | 2 refills | Status: DC

## 2019-10-16 NOTE — Telephone Encounter (Signed)
Patient called stating she is calling to inform provider that she had a visit with her PCP and would like to increase her Cymbalta to 40 mg per previous conversation with Dr. Harrington Challenger. Per pt if the insurance does not pay for the 40 she will call back to get the 60 mg but first she would like the 40 mg sent to her pharmacy. Per pt she would like for provider to write a note to the pharmacy on the 40 mg Cymbalta script to not until 11-05-19 because she just filled the 30 mg on 10-06-2019.

## 2019-10-16 NOTE — Telephone Encounter (Signed)
sent 

## 2019-10-20 DIAGNOSIS — Z1211 Encounter for screening for malignant neoplasm of colon: Secondary | ICD-10-CM | POA: Diagnosis not present

## 2019-10-20 DIAGNOSIS — R159 Full incontinence of feces: Secondary | ICD-10-CM | POA: Diagnosis not present

## 2019-10-20 DIAGNOSIS — Z01818 Encounter for other preprocedural examination: Secondary | ICD-10-CM | POA: Diagnosis not present

## 2019-10-25 DIAGNOSIS — D126 Benign neoplasm of colon, unspecified: Secondary | ICD-10-CM | POA: Diagnosis not present

## 2019-10-25 DIAGNOSIS — R159 Full incontinence of feces: Secondary | ICD-10-CM | POA: Diagnosis not present

## 2019-10-25 DIAGNOSIS — K648 Other hemorrhoids: Secondary | ICD-10-CM | POA: Diagnosis not present

## 2019-10-25 DIAGNOSIS — K5730 Diverticulosis of large intestine without perforation or abscess without bleeding: Secondary | ICD-10-CM | POA: Diagnosis not present

## 2019-11-02 DIAGNOSIS — E2839 Other primary ovarian failure: Secondary | ICD-10-CM | POA: Diagnosis not present

## 2019-11-02 DIAGNOSIS — Z0389 Encounter for observation for other suspected diseases and conditions ruled out: Secondary | ICD-10-CM | POA: Diagnosis not present

## 2019-11-02 DIAGNOSIS — Z780 Asymptomatic menopausal state: Secondary | ICD-10-CM | POA: Diagnosis not present

## 2019-11-06 DIAGNOSIS — Z23 Encounter for immunization: Secondary | ICD-10-CM | POA: Diagnosis not present

## 2019-11-14 ENCOUNTER — Ambulatory Visit (INDEPENDENT_AMBULATORY_CARE_PROVIDER_SITE_OTHER): Payer: Medicare Other | Admitting: Neurology

## 2019-11-14 ENCOUNTER — Other Ambulatory Visit: Payer: Self-pay

## 2019-11-14 ENCOUNTER — Encounter: Payer: Self-pay | Admitting: Neurology

## 2019-11-14 ENCOUNTER — Telehealth (HOSPITAL_COMMUNITY): Payer: Self-pay | Admitting: Psychiatry

## 2019-11-14 VITALS — BP 124/80 | Ht 66.0 in | Wt 176.2 lb

## 2019-11-14 DIAGNOSIS — R569 Unspecified convulsions: Secondary | ICD-10-CM | POA: Diagnosis not present

## 2019-11-14 NOTE — Patient Instructions (Signed)
For now continue the carbatrol at current dosing Check blood work today  Call for seizure activity  See you back in 8 months

## 2019-11-14 NOTE — Telephone Encounter (Signed)
Called to schedule f/u appt, left vm 

## 2019-11-14 NOTE — Progress Notes (Signed)
PATIENT: Erica Bennett DOB: December 04, 1945  REASON FOR VISIT: follow up HISTORY FROM: patient  HISTORY OF PRESENT ILLNESS: Today 11/14/19 Erica Bennett is a 74 year old female with history of seizure-like events, mixed connective tissue disease taking Plaquenil.  MRI of the brain has been unremarkable.  In the past her seizure events have been described as staring off, she will drop things.  In February 2020, found to have low sodium, dose of Carbatrol was decreased from 200 mg twice a day, to 200 mg twice a day.  Since last seen, she had one "funny" sensation of near seizure, brought on by paper movement from a magazine in a doctor's office waiting room.  No seizure actually occurred.  She has fibromyalgia, is now on Cymbalta.  She is blind, has artificial eyes.  She requires brand-name Carbatrol, had breakthrough seizures on generic.  In 17 years, her husband has only seen her have 1 seizure.  Has had some rectal sphincter issues, having an implant.  Presents today for evaluation accompanied by her husband.   HISTORY 03/14/2019 SS: Erica Bennett is a 74 year old female with history of seizure-like events, mixed connective tissue disease taking Plaquenil for this.  MRI of the brain has been unremarkable.  In the past, her seizure events have been described as staring off, she would drop things.  Her last seizure occurred 16 years ago.  She says in the interim she has been diagnosed with fibromyalgia.  She is blind, has artificial eyes. She has remained on lower dose of carbamazepine, as result of low sodium level.  She has done well with this.  She does require brand-name Carbatrol, when on generic, says she has breakthrough seizures.  She presents today for evaluation accompanied by her husband.   REVIEW OF SYSTEMS: Out of a complete 14 system review of symptoms, the patient complains only of the following symptoms, and all other reviewed systems are negative.  Seizure  ALLERGIES: Allergies    Allergen Reactions  . Bacitracin Rash  . Neomycin Rash  . Sulfa Antibiotics Swelling and Rash  . Sulfamethoxazole Other (See Comments) and Swelling  . Eggs Or Egg-Derived Products Diarrhea    Stomach cramps  . Elavil [Amitriptyline Hcl] Other (See Comments)    Really bad tremors  . Gabapentin Other (See Comments)    Balance issues  . Prozac [Fluoxetine Hcl]     Severe headache    HOME MEDICATIONS: Outpatient Medications Prior to Visit  Medication Sig Dispense Refill  . ALPRAZolam (XANAX) 1 MG tablet Take 1 tablet (1 mg total) by mouth at bedtime. 30 tablet 3  . CARBATROL 200 MG 12 hr capsule Take 1 capsule (200 mg total) by mouth daily. 90 capsule 3  . DEXILANT 60 MG capsule TAKE ONE CAPSULE BY MOUTH DAILY; ON AN EMPTY STOMACH, THEN DO NOT EAT FOR ONE HOUR. (Patient taking differently: Take 60 mg by mouth daily before breakfast. ) 30 capsule 5  . DULoxetine (CYMBALTA) 20 MG capsule Take 1 capsule (20 mg total) by mouth daily. (Patient taking differently: Take 40 mg by mouth daily. ) 60 capsule 2  . ECHINACEA PO Take 1,520 mg by mouth daily. 760 mg per capsule    . HYDROcodone-acetaminophen (NORCO/VICODIN) 5-325 MG tablet Take 1 tablet by mouth every 6 (six) hours as needed for moderate pain.    . hydroxychloroquine (PLAQUENIL) 200 MG tablet Take 1 tablet (200 mg total) by mouth 2 (two) times daily. 180 tablet 1  . MAXALT 10 MG tablet  Take 1 tablet (10 mg total) by mouth as needed. (Patient taking differently: Take 10 mg by mouth 3 (three) times daily as needed for migraine. ) 10 tablet 5  . metoprolol succinate (TOPROL-XL) 100 MG 24 hr tablet Take 1 tablet (100 mg total) by mouth every evening. 90 tablet 0  . NARCAN 4 MG/0.1ML LIQD nasal spray kit Place 1 spray into the nose as needed (accidental overdose.).     Marland Kitchen DULoxetine (CYMBALTA) 30 MG capsule TAKE ONE CAPSULE BY MOUTH ONCE DAILY 30 capsule 2   No facility-administered medications prior to visit.    PAST MEDICAL  HISTORY: Past Medical History:  Diagnosis Date  . Anxiety   . Blindness of both eyes   . Complication of anesthesia   . Depression   . Elevated cholesterol   . GERD (gastroesophageal reflux disease)   . Headache   . Hypertension   . PONV (postoperative nausea and vomiting)   . Seizures ([place])    no seizures in over 10 yrs  . Skin cancer   . Sleep apnea    does not use CPAP  . Thyroid mass    left    PAST SURGICAL HISTORY: Past Surgical History:  Procedure Laterality Date  . ABDOMINAL HYSTERECTOMY    . Bone spur removed from shoulder    . CHOLECYSTECTOMY    . ENUCLEATION Bilateral   . THYROIDECTOMY Left 11/07/2018   Procedure: LEFT HEMI THYROIDECTOMY;  Surgeon: Leta Baptist, MD;  Location: [facility];  Service: ENT;  Laterality: Left;  . TONSILLECTOMY      FAMILY HISTORY: Family History  Problem Relation Age of Onset  . Depression Paternal Aunt   . Alcohol abuse Paternal Uncle   . Drug abuse Paternal Uncle   . Depression Cousin   . Depression Maternal Aunt   . Colon cancer Mother   . Colon cancer Maternal Aunt   . Colon cancer Other     SOCIAL HISTORY: Social History   Socioeconomic History  . Marital status: Married    Spouse name: Not on file  . Number of children: 0  . Years of education: 30  . Highest education level: Not on file  Occupational History  . Occupation: Retired  Tobacco Use  . Smoking status: Former Smoker    Quit date: 01/03/1992    Years since quitting: 27.8  . Smokeless tobacco: Never Used  Vaping Use  . Vaping Use: Never used  Substance and Sexual Activity  . Alcohol use: No    Alcohol/week: 0.0 standard drinks  . Drug use: No  . Sexual activity: Yes    Birth control/protection: Surgical  Other Topics Concern  . Not on file  Social History Narrative   POA-[place]   Caffeine use: daily (1 soda per day, 1 coffee per day)   Left handed    Legally blind      Worked as LPN/RN for about 8 years   Social Determinants  of Health   Financial Resource Strain:   . Difficulty of Paying Living Expenses: Not on file  Food Insecurity:   . Worried About Charity fundraiser in the Last Year: Not on file  . Ran Out of Food in the Last Year: Not on file  Transportation Needs:   . Lack of Transportation (Medical): Not on file  . Lack of Transportation (Non-Medical): Not on file  Physical Activity:   . Days of Exercise per Week: Not on file  . Minutes of Exercise  per Session: Not on file  Stress:   . Feeling of Stress : Not on file  Social Connections:   . Frequency of Communication with Friends and Family: Not on file  . Frequency of Social Gatherings with Friends and Family: Not on file  . Attends Religious Services: Not on file  . Active Member of Clubs or Organizations: Not on file  . Attends Archivist Meetings: Not on file  . Marital Status: Not on file  Intimate Partner Violence:   . Fear of Current or Ex-Partner: Not on file  . Emotionally Abused: Not on file  . Physically Abused: Not on file  . Sexually Abused: Not on file   PHYSICAL EXAM  Vitals:   11/14/19 1337  BP: 124/80  Weight: 176 lb 3.2 oz (79.9 kg)  Height: _0  (1.676 m)   Body mass index is 28.44 kg/m.  Generalized: Well developed, in no acute distress  Neurological examination  Mentation: Alert oriented to time, place, history taking. Follows all commands speech and language fluent Cranial nerve II-XII: Facial symmetry is present, speech is normal, she is blind. Motor: Good strength all extremities Sensory: Sensory testing is intact to soft touch on all 4 extremities. No evidence of extinction is noted.  Gait and station: Gait is normal, but is blind, requires leading Reflexes: Deep tendon reflexes are symmetric   DIAGNOSTIC DATA (LABS, IMAGING, TESTING) - I reviewed patient records, labs, notes, testing and imaging myself where available.  Lab Results  Component Value Date   WBC 8.4 03/14/2019   HGB 14.2  03/14/2019   HCT 42.6 03/14/2019   MCV 86 03/14/2019   PLT 271 03/14/2019      Component Value Date/Time   NA 141 03/14/2019 1412   K 4.2 03/14/2019 1412   CL 102 03/14/2019 1412   CO2 24 03/14/2019 1412   GLUCOSE 82 03/14/2019 1412   BUN 12 03/14/2019 1412   CREATININE 0.88 03/14/2019 1412   CALCIUM 9.2 03/14/2019 1412   PROT 6.9 03/14/2019 1412   ALBUMIN 4.4 03/14/2019 1412   AST 15 03/14/2019 1412   ALT 14 03/14/2019 1412   ALKPHOS 111 03/14/2019 1412   BILITOT 0.2 03/14/2019 1412   GFRNONAA 66 03/14/2019 1412   GFRAA 76 03/14/2019 1412   Lab Results  Component Value Date   CHOL 203 (H) 11/18/2017   HDL 51 11/18/2017   LDLCALC 107 (H) 11/18/2017   TRIG 227 (H) 11/18/2017   CHOLHDL 4.0 11/18/2017   No results found for: HGBA1C Lab Results  Component Value Date   VITAMINB12 542 03/02/2018   Lab Results  Component Value Date   TSH 2.940 03/02/2018    ASSESSMENT AND PLAN 74 y.o. year old female  has a past medical history of Anxiety, Blindness of both eyes, Complication of anesthesia, Depression, Elevated cholesterol, GERD (gastroesophageal reflux disease), Headache, Hypertension, PONV (postoperative nausea and vomiting), Seizures ([place]), Skin cancer, Sleep apnea, and Thyroid mass. here with:  1.  Seizure-like events -On lower dose of Carbatrol 200 mg daily since Feb 2020, due to low sodium levels at 400 mg daily -Check routine blood work -1 episode of pre-seizure feeling, but no seizure resulted -Depending on labs, decide to stay on current dosing or increase to 300 mg daily, in Feb carbamazepine level low 3.3, sodium level 141 -Follow-up in 8 months or sooner if needed, call for seizure activity  Orders Placed This Encounter  Procedures  . CBC with Differential/Platelet  . CMP  .  Carbamazepine level, total   I spent 30 minutes of face-to-face and non-face-to-face time with patient.  This included previsit chart review, lab review, study review, order entry,  electronic health record documentation, patient education.  Butler Denmark, AGNP-C, DNP 11/14/2019, 2:26 PM [facility] [address] [place], [place] [address] [phone]

## 2019-11-15 LAB — COMPREHENSIVE METABOLIC PANEL
ALT: 17 IU/L (ref 0–32)
AST: 17 IU/L (ref 0–40)
Albumin/Globulin Ratio: 1.8 (ref 1.2–2.2)
Albumin: 4.4 g/dL (ref 3.7–4.7)
Alkaline Phosphatase: 121 IU/L (ref 44–121)
BUN/Creatinine Ratio: 12 (ref 12–28)
BUN: 10 mg/dL (ref 8–27)
Bilirubin Total: 0.3 mg/dL (ref 0.0–1.2)
CO2: 28 mmol/L (ref 20–29)
Calcium: 9.6 mg/dL (ref 8.7–10.3)
Chloride: 101 mmol/L (ref 96–106)
Creatinine, Ser: 0.83 mg/dL (ref 0.57–1.00)
GFR calc Af Amer: 81 mL/min/{1.73_m2} (ref >59)
GFR calc non Af Amer: 71 mL/min/{1.73_m2} (ref >59)
Globulin, Total: 2.5 g/dL (ref 1.5–4.5)
Glucose: 97 mg/dL (ref 65–99)
Potassium: 4.2 mmol/L (ref 3.5–5.2)
Sodium: 143 mmol/L (ref 134–144)
Total Protein: 6.9 g/dL (ref 6.0–8.5)

## 2019-11-15 LAB — CBC WITH DIFFERENTIAL/PLATELET
Basophils Absolute: 0 10*3/uL (ref 0.0–0.2)
Basos: 0 %
EOS (ABSOLUTE): 0 10*3/uL (ref 0.0–0.4)
Eos: 0 %
Hematocrit: 39.5 % (ref 34.0–46.6)
Hemoglobin: 13.3 g/dL (ref 11.1–15.9)
Immature Grans (Abs): 0.1 10*3/uL (ref 0.0–0.1)
Immature Granulocytes: 1 %
Lymphocytes Absolute: 2.2 10*3/uL (ref 0.7–3.1)
Lymphs: 18 %
MCH: 28.7 pg (ref 26.6–33.0)
MCHC: 33.7 g/dL (ref 31.5–35.7)
MCV: 85 fL (ref 79–97)
Monocytes Absolute: 1.2 10*3/uL — ABNORMAL HIGH (ref 0.1–0.9)
Monocytes: 10 %
Neutrophils Absolute: 8.4 10*3/uL — ABNORMAL HIGH (ref 1.4–7.0)
Neutrophils: 71 %
Platelets: 307 10*3/uL (ref 150–450)
RBC: 4.63 x10E6/uL (ref 3.77–5.28)
RDW: 12.2 % (ref 11.7–15.4)
WBC: 11.8 10*3/uL — ABNORMAL HIGH (ref 3.4–10.8)

## 2019-11-15 LAB — CARBAMAZEPINE LEVEL, TOTAL: Carbamazepine (Tegretol), S: 6.1 ug/mL (ref 4.0–12.0)

## 2019-11-16 NOTE — Progress Notes (Signed)
I have read the note, and I agree with the clinical assessment and plan.  Gaylan Fauver K Lanitra Battaglini   

## 2019-11-22 ENCOUNTER — Ambulatory Visit: Payer: Self-pay | Admitting: General Surgery

## 2019-11-22 DIAGNOSIS — R159 Full incontinence of feces: Secondary | ICD-10-CM | POA: Diagnosis not present

## 2019-11-22 NOTE — H&P (Signed)
The patient is a 74 year old female who presents with fecal incontinence. 74 year old female who presents to the office for evaluation of InterStim placement. She has complete fecal incontinence and underwent trial phase of InterStim which showed no incontinence while using the device. Prior to that she was having episodes of incontinence every day to every other day. She also had improvement in her urgency. She has recently underwent a colonoscopy which showed no other pathology except for 1 polyp. She reports inconsistent bowel movements. She is visually impaired.   Past Surgical History Darden Palmer, [place]; 11/22/2019 9:50 AM) Appendectomy Breast Biopsy Bilateral. Cataract Surgery Bilateral. Colon Polyp Removal - Colonoscopy Colon Polyp Removal - Open Gallbladder Surgery - Laparoscopic Gallbladder Surgery - Open Hysterectomy (not due to cancer) - Partial Oral Surgery Shoulder Surgery Right. Thyroid Surgery  Diagnostic Studies History Darden Palmer, [place]; 11/22/2019 9:50 AM) Colonoscopy within last year Mammogram within last year Pap Smear 1-5 years ago  Allergies Darden Palmer, RMA; 11/22/2019 9:54 AM) Sulfacetamide *CHEMICALS* Swelling, Rash. Allergies Reconciled  Medication History Darden Palmer, [place]; 11/22/2019 9:54 AM) DULoxetine HCl (20MG  Capsule DR Part, Oral) Active. ALPRAZolam (1MG  Tablet, Oral) Active. Carbatrol (200MG  Capsule ER 12HR, Oral) Active. HYDROcodone-Acetaminophen (5-325MG  Tablet, Oral) Active. Metoprolol Succinate ER (100MG  Tablet ER 24HR, Oral) Active. Dexilant (60MG  Capsule DR, Oral) Active. Maxalt (10MG  Tablet, Oral) Active. Hydroxychloroquine Sulfate (200MG  Tablet, Oral) Active. Vitamin D (Ergocalciferol) (1.25 MG(50000 UT) Capsule, Oral) Active. Echinacea (Oral) Specific strength unknown - Active. Medications Reconciled  Pregnancy / Birth History Darden Palmer, [place]; 11/22/2019 9:50 AM) Age at menarche 28  years. Gravida 0 Para 0  Other Problems Lattie Haw [place], [place]; 11/22/2019 9:50 AM) Anxiety Disorder Arthritis Back Pain Cholelithiasis Diverticulosis Gastroesophageal Reflux Disease Hypercholesterolemia Migraine Headache Seizure Disorder Thyroid Disease     Review of Systems Darden Palmer RMA; 11/22/2019 9:50 AM) General Not Present- Appetite Loss, Chills, Fatigue, Fever, Night Sweats, Weight Gain and Weight Loss. Skin Not Present- Change in Wart/Mole, Dryness, Hives, Jaundice, New Lesions, Non-Healing Wounds, Rash and Ulcer. HEENT Not Present- Earache, Hearing Loss, Hoarseness, Nose Bleed, Oral Ulcers, Ringing in the Ears, Seasonal Allergies, Sinus Pain, Sore Throat, Visual Disturbances, Wears glasses/contact lenses and Yellow Eyes. Respiratory Not Present- Bloody sputum, Chronic Cough, Difficulty Breathing, Snoring and Wheezing. Breast Not Present- Breast Mass, Breast Pain, Nipple Discharge and Skin Changes. Cardiovascular Not Present- Chest Pain, Difficulty Breathing Lying Down, Leg Cramps, Palpitations, Rapid Heart Rate, Shortness of Breath and Swelling of Extremities. Gastrointestinal Not Present- Abdominal Pain, Bloating, Bloody Stool, Change in Bowel Habits, Chronic diarrhea, Constipation, Difficulty Swallowing, Excessive gas, Gets full quickly at meals, Hemorrhoids, Indigestion, Nausea, Rectal Pain and Vomiting. Female Genitourinary Not Present- Frequency, Nocturia, Painful Urination, Pelvic Pain and Urgency. Musculoskeletal Not Present- Back Pain, Joint Pain, Joint Stiffness, Muscle Pain, Muscle Weakness and Swelling of Extremities. Neurological Not Present- Decreased Memory, Fainting, Headaches, Numbness, Seizures, Tingling, Tremor, Trouble walking and Weakness. Psychiatric Not Present- Anxiety, Bipolar, Change in Sleep Pattern, Depression, Fearful and Frequent crying. Endocrine Not Present- Cold Intolerance, Excessive Hunger, Hair Changes, Heat Intolerance, Hot  flashes and New Diabetes. Hematology Not Present- Blood Thinners, Easy Bruising, Excessive bleeding, Gland problems, HIV and Persistent Infections.  Vitals Lattie Haw [place] RMA; 11/22/2019 9:55 AM) 11/22/2019 9:54 AM Weight: 180.25 lb Height: 67in Body Surface Area: 1.93 m Body Mass Index: 28.23 kg/m  Temp.: 97.16F  Pulse: 96 (Regular)  P.OX: 98% (Room air) BP: 126/80(Sitting, Left Arm, Standard)        Physical Exam Leighton Ruff MD; 60/45/4098 10:10 AM)  General Mental Status-Alert. General Appearance-Cooperative.  Abdomen Palpation/Percussion Palpation and Percussion of the abdomen reveal - Soft and Non Tender.  Rectal Anorectal Exam Internal - decreased sphincter tone. Note: Decreased squeeze pressure. Mild anal gaping.    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 She is underwent a complete workup including temporary InterStim wire placement, which showed improvement of her symptoms from approximately every other day to every day, incontinence to No incontinence while the device was in place. The device is now been removed and she is experiencing incontinence once again. She has tried multiple forms of medical therapy with no improvement in her symptoms. I have recommended placement of a permanent sacral nerve stimulator to assist with her symptoms. We have discussed the risk of recurrent incontinence, device malfunction and infection. I believe she understands this and wishes to proceed with the surgery.  Current Plans

## 2019-12-04 DIAGNOSIS — G894 Chronic pain syndrome: Secondary | ICD-10-CM | POA: Diagnosis not present

## 2019-12-04 DIAGNOSIS — Z79899 Other long term (current) drug therapy: Secondary | ICD-10-CM | POA: Diagnosis not present

## 2019-12-04 DIAGNOSIS — M2550 Pain in unspecified joint: Secondary | ICD-10-CM | POA: Diagnosis not present

## 2019-12-05 DIAGNOSIS — Z23 Encounter for immunization: Secondary | ICD-10-CM | POA: Diagnosis not present

## 2019-12-19 ENCOUNTER — Encounter (HOSPITAL_BASED_OUTPATIENT_CLINIC_OR_DEPARTMENT_OTHER): Payer: Self-pay | Admitting: General Surgery

## 2019-12-20 ENCOUNTER — Other Ambulatory Visit: Payer: Self-pay

## 2019-12-20 ENCOUNTER — Encounter (HOSPITAL_BASED_OUTPATIENT_CLINIC_OR_DEPARTMENT_OTHER): Payer: Self-pay | Admitting: General Surgery

## 2019-12-20 NOTE — Progress Notes (Addendum)
ADDENDUM:  Pt came today for PAT appointment to have her airway evaluated by anesthesia.  Pt assessed by Dr Suzette Battiest MDA, refer to Konrad Felix PA progress note dated today 12-26-2019, stated on to proceed at [facility] barring any acute status change.   ADDENDUM:  Chart reviewed by anesthesia, Konrad Felix PA,  Stated pt need to come in for airway evaluation.  Called and spoke w/ pt and pt's husband via phone and explained to them about anesthesia evaluation and if she could come in today or tomorrow.  Pt stated she has appointment today at 1300 in [place] and can come after that.  Appointment made at 1430.   Spoke w/ via phone for pre-op interview--- pt Lab needs dos----  NO             Lab results------ no COVID test ------  12-26-2019 @ 1300 Arrive at ------- 0900 NPO after MN NO Solid Food.  Clear liquids from MN until--- 0800 Medications to take morning of surgery ----- Cymbalta, Plaquenil, Dexilant, and if needed Norco, Maxalt Diabetic medication ----- n/a Patient Special Instructions ----- n/a Pre-Op special Istructions -----  Pt is blind w/ bilateral eye prosthesiis.  Pt stated sometimes she needs her husband, told if needed the nurse could get him to pre-op  Patient verbalized understanding of instructions that were given at this phone interview. Patient denies shortness of breath, chest pain, fever, cough at this phone interview.  Anesthesia review:  Pt has limited mouth opening due to tmj surgery.  Also has ponv and hard to wake. Pt has left thyroidectomy 11-07-2018 @[facility]  , refer to anesthesia record.   Chart to be reviewed by anesthesia , Konrad Felix PA.

## 2019-12-26 ENCOUNTER — Encounter (HOSPITAL_COMMUNITY)
Admission: RE | Admit: 2019-12-26 | Discharge: 2019-12-26 | Disposition: A | Discharge status: Home / Self Care | Payer: Medicare Other | Source: Ambulatory Visit | Attending: General Surgery | Admitting: General Surgery

## 2019-12-26 ENCOUNTER — Other Ambulatory Visit (HOSPITAL_COMMUNITY)
Admission: RE | Admit: 2019-12-26 | Discharge: 2019-12-26 | Disposition: A | Discharge status: Home / Self Care | Payer: Medicare Other | Source: Ambulatory Visit | Attending: General Surgery | Admitting: General Surgery

## 2019-12-26 ENCOUNTER — Other Ambulatory Visit: Payer: Self-pay

## 2019-12-26 DIAGNOSIS — Z01812 Encounter for preprocedural laboratory examination: Secondary | ICD-10-CM | POA: Insufficient documentation

## 2019-12-26 DIAGNOSIS — Z20822 Contact with and (suspected) exposure to covid-19: Secondary | ICD-10-CM | POA: Diagnosis not present

## 2019-12-26 LAB — SARS CORONAVIRUS 2 (TAT 6-24 HRS): SARS Coronavirus 2: NEGATIVE

## 2019-12-26 NOTE — Progress Notes (Signed)
Anesthesia Chart Review   Case: [id number] Date/Time: 12/28/19 1200   Procedure: INSERTION OF SCRAL NERVE STIMULATOR FINAL PHASE (N/A )   Anesthesia type: Monitor Anesthesia Care   Pre-op diagnosis: FECAL INCONTIENCE   Location: [facility] OR ROOM 3 / [place]   Surgeons: Erica Ruff, MD      [id number]:74 y.o. 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 Leighton Bennett.   Pt with limited mouth opening due to previous TMJ surgery.  Per previous anesthesia note 11/07/2018, "Smooth iv induction and easy mask.  Attempted with mac 4 blade and could only see epiglottis.  Pt has a very small mouth and is very anterior.  With the glidescope we obtained a grade I view but it was difficult to have the blade and place the ett due to her small oral opening."    Evaluated by Dr. Deatra Bennett at PAT visit 12/26/2019. Anticipate pt can proceed with planned procedure barring acute status change.   VS: BP (!) 165/61   Pulse 63   Temp 36.8 C (Oral)   Resp 16   Ht _0  (1.651 m)   Wt 79.8 kg   SpO2 98%   BMI 29.29 kg/m   PROVIDERS: Erica Perches, NP is PCP   Erica Fast, MD is Neurologist  LABS: labs DOS (all labs ordered are listed, but only abnormal results are displayed)  Labs Reviewed - No data to display   IMAGES:   EKG:   CV:  Past Medical History:  Diagnosis Date  . Anxiety   . Blindness of both eyes    due to degenerative retina and acute glaucoma  . Complication of anesthesia    pt has limited mouth opening due to bilateral tmj surgery  . Fecal incontinence   . Fibromyalgia   . Generalized seizure disorder [facility])    neurologist-- dr Erica Bennett--  seizure is staring off and dropping things  (12-20-2019  per pt last seizure approx. 2005)  . GERD (gastroesophageal reflux disease)   . H/O Hashimoto thyroiditis   . 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 History of hypertension   . History of skin cancer   . History of thyroid nodule    bx left thyroid nodule 11-17-2017;  s/p left thyroidectomy 11-07-2018, benign mass  . Hyperlipidemia   . Limitation of opening of mouth    per pt due to bilateral tmj surgery 1989  . MDD (major depressive disorder)   . Migraines   . Mixed connective tissue disease [facility])    rheumotologist-- dr Erica Bennett--  taking plaquenil  . OA (osteoarthritis)   . OSA (obstructive sleep apnea)    does not use CPAP  . Osteoporosis   . PONV (postoperative nausea and vomiting)    and hard to wake  . Prosthetic eye globe    bilateral  . 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 ABDOMINAL HYSTERECTOMY  1980s   AND APPENDECTOMY  . ENUCLEATION Right 1984   right eye  . EVISCERATION Left 1996   left eye  . LAPAROSCOPIC CHOLECYSTECTOMY  1993  . SHOULDER ARTHROSCOPY WITH SUBACROMIAL DECOMPRESSION Right 09-15-2007  _1    AND BURSECTOMY/  DEBRIDEMENT ROTATOR CUFF  . TEMPOROMANDIBULAR JOINT SURGERY Bilateral 1989  . THYROIDECTOMY Left 11/07/2018   Procedure: LEFT HEMI THYROIDECTOMY;  Surgeon: Erica Baptist, MD;  Location: [facility]  Service: ENT;  Laterality: Left;  . TONSILLECTOMY  child    MEDICATIONS: . ALPRAZolam (XANAX) 1 MG tablet  . CARBATROL 200 MG 12 hr capsule  . DEXILANT 60 MG capsule  . DULoxetine (CYMBALTA) 20 MG capsule  . ECHINACEA PO  . ergocalciferol (VITAMIN D2) 1.25 MG (50000 UT) capsule  . HYDROcodone-acetaminophen (NORCO/VICODIN) 5-325 MG tablet  . hydroxychloroquine (PLAQUENIL) 200 MG tablet  . MAXALT 10 MG tablet  . metoprolol succinate (TOPROL-XL) 100 MG 24 hr tablet  . NARCAN 4 MG/0.1ML LIQD nasal spray kit  . NIACIN PO   No current facility-administered medications  for this encounter.     Erica Felix, PA-C [facility] Pre-Surgical Testing [phone]

## 2019-12-28 ENCOUNTER — Ambulatory Visit (HOSPITAL_COMMUNITY): Payer: Medicare Other

## 2019-12-28 ENCOUNTER — Other Ambulatory Visit: Payer: Self-pay

## 2019-12-28 ENCOUNTER — Encounter (HOSPITAL_BASED_OUTPATIENT_CLINIC_OR_DEPARTMENT_OTHER): Payer: Self-pay | Admitting: General Surgery

## 2019-12-28 ENCOUNTER — Ambulatory Visit (HOSPITAL_BASED_OUTPATIENT_CLINIC_OR_DEPARTMENT_OTHER)
Admission: RE | Admit: 2019-12-28 | Discharge: 2019-12-28 | Disposition: A | Discharge status: Home / Self Care | Payer: Medicare Other | Attending: General Surgery | Admitting: General Surgery

## 2019-12-28 ENCOUNTER — Encounter (HOSPITAL_BASED_OUTPATIENT_CLINIC_OR_DEPARTMENT_OTHER)
Admission: RE | Disposition: A | Discharge status: Home / Self Care | Payer: Self-pay | Source: Home / Self Care | Attending: General Surgery

## 2019-12-28 ENCOUNTER — Ambulatory Visit (HOSPITAL_BASED_OUTPATIENT_CLINIC_OR_DEPARTMENT_OTHER): Payer: Medicare Other | Admitting: Physician Assistant

## 2019-12-28 DIAGNOSIS — G4730 Sleep apnea, unspecified: Secondary | ICD-10-CM | POA: Insufficient documentation

## 2019-12-28 DIAGNOSIS — Z87891 Personal history of nicotine dependence: Secondary | ICD-10-CM | POA: Diagnosis not present

## 2019-12-28 DIAGNOSIS — K219 Gastro-esophageal reflux disease without esophagitis: Secondary | ICD-10-CM | POA: Insufficient documentation

## 2019-12-28 DIAGNOSIS — Z882 Allergy status to sulfonamides status: Secondary | ICD-10-CM | POA: Diagnosis not present

## 2019-12-28 DIAGNOSIS — Z9049 Acquired absence of other specified parts of digestive tract: Secondary | ICD-10-CM | POA: Insufficient documentation

## 2019-12-28 DIAGNOSIS — Z79899 Other long term (current) drug therapy: Secondary | ICD-10-CM | POA: Diagnosis not present

## 2019-12-28 DIAGNOSIS — Z9682 Presence of neurostimulator: Secondary | ICD-10-CM | POA: Diagnosis not present

## 2019-12-28 DIAGNOSIS — R569 Unspecified convulsions: Secondary | ICD-10-CM | POA: Insufficient documentation

## 2019-12-28 DIAGNOSIS — I10 Essential (primary) hypertension: Secondary | ICD-10-CM | POA: Insufficient documentation

## 2019-12-28 DIAGNOSIS — G43909 Migraine, unspecified, not intractable, without status migrainosus: Secondary | ICD-10-CM | POA: Insufficient documentation

## 2019-12-28 DIAGNOSIS — E039 Hypothyroidism, unspecified: Secondary | ICD-10-CM | POA: Diagnosis not present

## 2019-12-28 DIAGNOSIS — E7800 Pure hypercholesterolemia, unspecified: Secondary | ICD-10-CM | POA: Insufficient documentation

## 2019-12-28 DIAGNOSIS — M797 Fibromyalgia: Secondary | ICD-10-CM | POA: Insufficient documentation

## 2019-12-28 DIAGNOSIS — Z86010 Personal history of colonic polyps: Secondary | ICD-10-CM | POA: Diagnosis not present

## 2019-12-28 DIAGNOSIS — R159 Full incontinence of feces: Secondary | ICD-10-CM | POA: Insufficient documentation

## 2019-12-28 DIAGNOSIS — Z970 Presence of artificial eye: Secondary | ICD-10-CM | POA: Diagnosis not present

## 2019-12-28 DIAGNOSIS — E785 Hyperlipidemia, unspecified: Secondary | ICD-10-CM | POA: Diagnosis not present

## 2019-12-28 DIAGNOSIS — Z419 Encounter for procedure for purposes other than remedying health state, unspecified: Secondary | ICD-10-CM

## 2019-12-28 DIAGNOSIS — H548 Legal blindness, as defined in USA: Secondary | ICD-10-CM | POA: Insufficient documentation

## 2019-12-28 HISTORY — DX: Presence of artificial eye: Z97.0

## 2019-12-28 HISTORY — DX: Unspecified osteoarthritis, unspecified site: M19.90

## 2019-12-28 HISTORY — DX: Personal history of other diseases of the circulatory system: Z86.79

## 2019-12-28 HISTORY — DX: Age-related osteoporosis without current pathological fracture: M81.0

## 2019-12-28 HISTORY — DX: Major depressive disorder, single episode, unspecified: F32.9

## 2019-12-28 HISTORY — DX: Migraine, unspecified, not intractable, without status migrainosus: G43.909

## 2019-12-28 HISTORY — DX: Personal history of other endocrine, nutritional and metabolic disease: Z86.39

## 2019-12-28 HISTORY — DX: Hyperlipidemia, unspecified: E78.5

## 2019-12-28 HISTORY — DX: Unspecified retinal disorder: H35.9

## 2019-12-28 HISTORY — DX: Obstructive sleep apnea (adult) (pediatric): G47.33

## 2019-12-28 HISTORY — DX: Generalized idiopathic epilepsy and epileptic syndromes, not intractable, without status epilepticus: G40.309

## 2019-12-28 HISTORY — DX: Fibromyalgia: M79.7

## 2019-12-28 HISTORY — DX: Personal history of other malignant neoplasm of skin: Z85.828

## 2019-12-28 HISTORY — DX: Other overlap syndromes: M35.1

## 2019-12-28 HISTORY — DX: Limited mandibular range of motion: M26.52

## 2019-12-28 HISTORY — DX: Personal history of other diseases of the nervous system and sense organs: Z86.69

## 2019-12-28 HISTORY — DX: Full incontinence of feces: R15.9

## 2019-12-28 SURGERY — INSERTION, NEUROSTIMULATOR, SACRAL
Anesthesia: Monitor Anesthesia Care

## 2019-12-28 MED ORDER — PHENYLEPHRINE 40 MCG/ML (10ML) SYRINGE FOR IV PUSH (FOR BLOOD PRESSURE SUPPORT)
PREFILLED_SYRINGE | INTRAVENOUS | Status: DC | PRN
  Administered 2019-12-28: 80 ug via INTRAVENOUS

## 2019-12-28 MED ORDER — ACETAMINOPHEN 500 MG PO TABS
1000.0000 mg | ORAL_TABLET | ORAL | Status: AC
  Administered 2019-12-28: 1000 mg via ORAL

## 2019-12-28 MED ORDER — ONDANSETRON HCL 4 MG/2ML IJ SOLN
INTRAMUSCULAR | Status: AC
  Filled 2019-12-28: qty 2

## 2019-12-28 MED ORDER — DEXAMETHASONE SODIUM PHOSPHATE 4 MG/ML IJ SOLN
INTRAMUSCULAR | Status: DC | PRN
  Administered 2019-12-28: 4 mg via INTRAVENOUS

## 2019-12-28 MED ORDER — EPHEDRINE SULFATE 50 MG/ML IJ SOLN
INTRAMUSCULAR | Status: DC | PRN
  Administered 2019-12-28: 10 mg via INTRAVENOUS

## 2019-12-28 MED ORDER — PROPOFOL 500 MG/50ML IV EMUL
INTRAVENOUS | Status: AC
  Filled 2019-12-28: qty 50

## 2019-12-28 MED ORDER — FENTANYL CITRATE (PF) 100 MCG/2ML IJ SOLN: 25.0000 ug | INTRAMUSCULAR | Status: DC | PRN

## 2019-12-28 MED ORDER — LIDOCAINE 2% (20 MG/ML) 5 ML SYRINGE
INTRAMUSCULAR | Status: DC | PRN
  Administered 2019-12-28: 60 mg via INTRAVENOUS

## 2019-12-28 MED ORDER — LIDOCAINE HCL (PF) 2 % IJ SOLN
INTRAMUSCULAR | Status: AC
  Filled 2019-12-28: qty 5

## 2019-12-28 MED ORDER — ONDANSETRON HCL 4 MG/2ML IJ SOLN: 4.0000 mg | Freq: Once | INTRAMUSCULAR | Status: DC | PRN

## 2019-12-28 MED ORDER — ONDANSETRON HCL 4 MG/2ML IJ SOLN
INTRAMUSCULAR | Status: DC | PRN
  Administered 2019-12-28: 4 mg via INTRAVENOUS

## 2019-12-28 MED ORDER — CEFAZOLIN SODIUM-DEXTROSE 2-4 GM/100ML-% IV SOLN
2.0000 g | INTRAVENOUS | Status: AC
  Administered 2019-12-28: 2 g via INTRAVENOUS

## 2019-12-28 MED ORDER — PROPOFOL 500 MG/50ML IV EMUL
INTRAVENOUS | Status: DC | PRN
  Administered 2019-12-28: 200 ug/kg/min via INTRAVENOUS

## 2019-12-28 MED ORDER — DEXAMETHASONE SODIUM PHOSPHATE 10 MG/ML IJ SOLN
INTRAMUSCULAR | Status: AC
  Filled 2019-12-28: qty 1

## 2019-12-28 MED ORDER — BUPIVACAINE-EPINEPHRINE 0.5% -1:200000 IJ SOLN
INTRAMUSCULAR | Status: DC | PRN
  Administered 2019-12-28: 26 mL

## 2019-12-28 MED ORDER — STERILE WATER FOR IRRIGATION IR SOLN
Status: DC | PRN
  Administered 2019-12-28: 500 mL

## 2019-12-28 MED ORDER — PHENYLEPHRINE 40 MCG/ML (10ML) SYRINGE FOR IV PUSH (FOR BLOOD PRESSURE SUPPORT)
PREFILLED_SYRINGE | INTRAVENOUS | Status: AC
  Filled 2019-12-28: qty 10

## 2019-12-28 MED ORDER — OXYCODONE HCL 5 MG PO TABS: 5.0000 mg | ORAL_TABLET | Freq: Once | ORAL | Status: DC | PRN

## 2019-12-28 MED ORDER — SODIUM CHLORIDE 0.9% FLUSH: 3.0000 mL | Freq: Two times a day (BID) | INTRAVENOUS | Status: DC

## 2019-12-28 MED ORDER — ACETAMINOPHEN 500 MG PO TABS
ORAL_TABLET | ORAL | Status: AC
  Filled 2019-12-28: qty 2

## 2019-12-28 MED ORDER — LACTATED RINGERS IV SOLN: INTRAVENOUS | Status: DC

## 2019-12-28 MED ORDER — CEFAZOLIN SODIUM-DEXTROSE 2-4 GM/100ML-% IV SOLN
INTRAVENOUS | Status: AC
  Filled 2019-12-28: qty 100

## 2019-12-28 MED ORDER — AMISULPRIDE (ANTIEMETIC) 5 MG/2ML IV SOLN: 10.0000 mg | Freq: Once | INTRAVENOUS | Status: DC | PRN

## 2019-12-28 MED ORDER — OXYCODONE HCL 5 MG/5ML PO SOLN: 5.0000 mg | Freq: Once | ORAL | Status: DC | PRN

## 2019-12-28 SURGICAL SUPPLY — 52 items
ADH SKN CLS APL DERMABOND .7 (GAUZE/BANDAGES/DRESSINGS) ×1
APL PRP STRL LF DISP 70% ISPRP (MISCELLANEOUS) ×1
BLADE HEX COATED 2.75 (ELECTRODE) ×3 IMPLANT
BLADE SURG 15 STRL LF DISP TIS (BLADE) ×1 IMPLANT
BLADE SURG 15 STRL SS (BLADE) ×3
CABLE TEST STIMULATION (UROLOGICAL SUPPLIES) IMPLANT
CABLE TWIST LOCK 25CM (UROLOGICAL SUPPLIES) IMPLANT
CHLORAPREP W/TINT 26 (MISCELLANEOUS) ×3 IMPLANT
CLOSURE WOUND 1/2 X4 (GAUZE/BANDAGES/DRESSINGS)
COVER BACK TABLE 60X90IN (DRAPES) ×3 IMPLANT
COVER MAYO STAND STRL (DRAPES) ×3 IMPLANT
COVER PROBE U/S 5X48 (MISCELLANEOUS) IMPLANT
COVER WAND RF STERILE (DRAPES) ×3 IMPLANT
DERMABOND ADVANCED (GAUZE/BANDAGES/DRESSINGS) ×2
DERMABOND ADVANCED .7 DNX12 (GAUZE/BANDAGES/DRESSINGS) ×1 IMPLANT
DRAPE C-ARM 42X120 X-RAY (DRAPES) IMPLANT
DRAPE INCISE IOBAN 66X45 STRL (DRAPES) ×3 IMPLANT
DRAPE LAPAROSCOPIC ABDOMINAL (DRAPES) ×3 IMPLANT
DRAPE SHEET LG 3/4 BI-LAMINATE (DRAPES) IMPLANT
DRAPE UTILITY XL STRL (DRAPES) ×3 IMPLANT
DRSG TEGADERM 4X4.75 (GAUZE/BANDAGES/DRESSINGS) IMPLANT
ELECT REM PT RETURN 9FT ADLT (ELECTROSURGICAL) ×3
ELECTRODE REM PT RTRN 9FT ADLT (ELECTROSURGICAL) ×1 IMPLANT
GAUZE SPONGE 4X4 12PLY STRL (GAUZE/BANDAGES/DRESSINGS) IMPLANT
GLOVE BIO SURGEON STRL SZ 6.5 (GLOVE) ×2 IMPLANT
GLOVE BIO SURGEONS STRL SZ 6.5 (GLOVE) ×1
GLOVE BIOGEL PI IND STRL 7.0 (GLOVE) ×1 IMPLANT
GLOVE BIOGEL PI INDICATOR 7.0 (GLOVE) ×2
GOWN STRL REUS W/TWL XL LVL3 (GOWN DISPOSABLE) ×3 IMPLANT
INTRODUCER GUIDE DILATR SHEATH (SET/KITS/TRAYS/PACK) IMPLANT
KIT HANDSET INTERSTIM COMM (NEUROSURGERY SUPPLIES) ×3 IMPLANT
KIT TURNOVER CYSTO (KITS) ×3 IMPLANT
LEAD INTERSTIM 4.32 28 L (Lead) ×3 IMPLANT
NEEDLE HYPO 22GX1.5 SAFETY (NEEDLE) ×3 IMPLANT
NEUROSTIMULATOR 1.7X2X.06 (UROLOGICAL SUPPLIES) ×3 IMPLANT
PACK BASIN DAY SURGERY FS (CUSTOM PROCEDURE TRAY) ×3 IMPLANT
PAD ARMBOARD 7.5X6 YLW CONV (MISCELLANEOUS) IMPLANT
PENCIL SMOKE EVACUATOR (MISCELLANEOUS) ×3 IMPLANT
PROGRAMMER SMART TH90G01 (UROLOGICAL SUPPLIES) IMPLANT
STIMULATOR INTERSTIM 2X1.7X.3 (Miscellaneous) ×3 IMPLANT
STRIP CLOSURE SKIN 1/2X4 (GAUZE/BANDAGES/DRESSINGS) IMPLANT
SUT SILK 2 0 TIES 17X18 (SUTURE)
SUT SILK 2-0 18XBRD TIE BLK (SUTURE) IMPLANT
SUT VIC AB 3-0 SH 27 (SUTURE) ×3
SUT VIC AB 3-0 SH 27X BRD (SUTURE) ×1 IMPLANT
SUT VIC AB 4-0 PS2 18 (SUTURE) ×3 IMPLANT
SYR BULB IRRIG 60ML STRL (SYRINGE) ×3 IMPLANT
SYR CONTROL 10ML LL (SYRINGE) ×3 IMPLANT
TOWEL OR 17X26 10 PK STRL BLUE (TOWEL DISPOSABLE) ×6 IMPLANT
TUBE CONNECTING 12'X1/4 (SUCTIONS)
TUBE CONNECTING 12X1/4 (SUCTIONS) IMPLANT
WATER STERILE IRR 500ML POUR (IV SOLUTION) ×3 IMPLANT

## 2019-12-28 NOTE — Anesthesia Postprocedure Evaluation (Signed)
Anesthesia Post Note  Patient: Erica Bennett  Procedure(s) Performed: INSERTION OF SCRAL NERVE STIMULATOR FINAL PHASE (N/A )     Patient location during evaluation: PACU Anesthesia Type: MAC Level of consciousness: awake and alert Pain management: pain level controlled Vital Signs Assessment: post-procedure vital signs reviewed and stable Respiratory status: spontaneous breathing, nonlabored ventilation and respiratory function stable Cardiovascular status: blood pressure returned to baseline and stable Postop Assessment: no apparent nausea or vomiting Anesthetic complications: no   No complications documented.  Last Vitals:  Vitals:   12/28/19 1330 12/28/19 1345  BP: (!) 151/76 (!) 158/68  Pulse: (!) 56 (!) 56  Resp: 12 12  Temp:    SpO2: 100% 100%    Last Pain:  Vitals:   12/28/19 1400  TempSrc:   PainSc: 0-No pain                 Lidia Collum

## 2019-12-28 NOTE — Transfer of Care (Signed)
Immediate Anesthesia Transfer of Care Note  Patient: Erica Bennett  Procedure(s) Performed: Procedure(s) (LRB): INSERTION OF SCRAL NERVE STIMULATOR FINAL PHASE (N/A)  Patient Location: PACU  Anesthesia Type:MAC  Level of Consciousness: awake, alert  and oriented  Airway & Oxygen Therapy: Patient Spontanous Breathing and Patient connected to face mask oxygen  Post-op Assessment: Report given to PACU RN and Post -op Vital signs reviewed and stable  Post vital signs: Reviewed and stable  Complications: No apparent anesthesia complications Last Vitals:  Vitals Value Taken Time  BP 142/67 12/28/19 1313  Temp    Pulse 56 12/28/19 1314  Resp 11 12/28/19 1314  SpO2 100 % 12/28/19 1314  Vitals shown include unvalidated device data.  Last Pain:  Vitals:   12/28/19 0901  TempSrc: Oral         Complications: No complications documented.

## 2019-12-28 NOTE — H&P (Signed)
The patient is a 74 year old female who presents with fecal incontinence. 74 year old female who presents to the office for evaluation of InterStim placement. She has complete fecal incontinence and underwent trial phase of InterStim which showed no incontinence while using the device. Prior to that she was having episodes of incontinence every day to every other day. She also had improvement in her urgency. She has recently underwent a colonoscopy which showed no other pathology except for 1 polyp. She reports inconsistent bowel movements. She is visually impaired.   Past Surgical History  Appendectomy Breast Biopsy Bilateral. Cataract Surgery Bilateral. Colon Polyp Removal - Colonoscopy Colon Polyp Removal - Open Gallbladder Surgery - Laparoscopic Gallbladder Surgery - Open Hysterectomy (not due to cancer) - Partial Oral Surgery Shoulder Surgery Right. Thyroid Surgery  Diagnostic Studies History  Colonoscopy within last year Mammogram within last year Pap Smear 1-5 years ago  Allergies Darden Palmer, RMA; 11/22/2019 9:54 AM) Sulfacetamide *CHEMICALS* Swelling, Rash. Allergies Reconciled  Medication History  DULoxetine HCl (20MG  Capsule DR Part, Oral) Active. ALPRAZolam (1MG  Tablet, Oral) Active. Carbatrol (200MG  Capsule ER 12HR, Oral) Active. HYDROcodone-Acetaminophen (5-325MG  Tablet, Oral) Active. Metoprolol Succinate ER (100MG  Tablet ER 24HR, Oral) Active. Dexilant (60MG  Capsule DR, Oral) Active. Maxalt (10MG  Tablet, Oral) Active. Hydroxychloroquine Sulfate (200MG  Tablet, Oral) Active. Vitamin D (Ergocalciferol) (1.25 MG(50000 UT) Capsule, Oral) Active. Echinacea (Oral) Specific strength unknown - Active. Medications Reconciled  Pregnancy / Birth History Darden Palmer, [place]; 11/22/2019 9:50 AM) Age at menarche 54 years. Gravida 0 Para 0  Other Problems Lattie Haw [place], [place]; 11/22/2019 9:50 AM) Anxiety  Disorder Arthritis Back Pain Cholelithiasis Diverticulosis Gastroesophageal Reflux Disease Hypercholesterolemia Migraine Headache Seizure Disorder Thyroid Disease     Review of Systems Darden Palmer RMA; 11/22/2019 9:50 AM) General Not Present- Appetite Loss, Chills, Fatigue, Fever, Night Sweats, Weight Gain and Weight Loss. Skin Not Present- Change in Wart/Mole, Dryness, Hives, Jaundice, New Lesions, Non-Healing Wounds, Rash and Ulcer. HEENT Not Present- Earache, Hearing Loss, Hoarseness, Nose Bleed, Oral Ulcers, Ringing in the Ears, Seasonal Allergies, Sinus Pain, Sore Throat, Visual Disturbances, Wears glasses/contact lenses and Yellow Eyes. Respiratory Not Present- Bloody sputum, Chronic Cough, Difficulty Breathing, Snoring and Wheezing. Breast Not Present- Breast Mass, Breast Pain, Nipple Discharge and Skin Changes. Cardiovascular Not Present- Chest Pain, Difficulty Breathing Lying Down, Leg Cramps, Palpitations, Rapid Heart Rate, Shortness of Breath and Swelling of Extremities. Gastrointestinal Not Present- Abdominal Pain, Bloating, Bloody Stool, Change in Bowel Habits, Chronic diarrhea, Constipation, Difficulty Swallowing, Excessive gas, Gets full quickly at meals, Hemorrhoids, Indigestion, Nausea, Rectal Pain and Vomiting. Female Genitourinary Not Present- Frequency, Nocturia, Painful Urination, Pelvic Pain and Urgency. Musculoskeletal Not Present- Back Pain, Joint Pain, Joint Stiffness, Muscle Pain, Muscle Weakness and Swelling of Extremities. Neurological Not Present- Decreased Memory, Fainting, Headaches, Numbness, Seizures, Tingling, Tremor, Trouble walking and Weakness. Psychiatric Not Present- Anxiety, Bipolar, Change in Sleep Pattern, Depression, Fearful and Frequent crying. Endocrine Not Present- Cold Intolerance, Excessive Hunger, Hair Changes, Heat Intolerance, Hot flashes and New Diabetes. Hematology Not Present- Blood Thinners, Easy Bruising, Excessive  bleeding, Gland problems, HIV and Persistent Infections.  BP (!) 158/72   Pulse 63   Temp 98.1 F (36.7 C) (Oral)   Resp 15   Ht 5\' 5"  (1.651 m)   Wt 79.5 kg   SpO2 97%   BMI 29.15 kg/m      Physical Exam   General Mental Status-Alert. General Appearance-Cooperative. CV: RRR Lungs: CTA Abdomen Palpation/Percussion Palpation and Percussion of the abdomen reveal - Soft and Non Tender.  Rectal Anorectal Exam Internal - decreased sphincter tone. Note: Decreased squeeze pressure. Mild anal gaping.    Assessment & Plan   FULL INCONTINENCE OF FECES (R15.9) Impression: 74 year old female who presents to the office for evaluation of fecal incontinence. She is underwent a complete workup including temporary InterStim wire placement, which showed improvement of her symptoms from approximately every other day to every day, to No incontinence while the device was in place. The device is now been removed and she is experiencing incontinence once again. She has tried multiple forms of medical therapy with no improvement in her symptoms. I have recommended placement of a permanent sacral nerve stimulator to assist with her symptoms. We have discussed the risk of recurrent incontinence, device malfunction and infection. I believe she understands this and wishes to proceed with the surgery.

## 2019-12-28 NOTE — Anesthesia Preprocedure Evaluation (Signed)
Anesthesia Evaluation  Patient identified by MRN, date of birth, ID band Patient awake    Reviewed: Allergy & Precautions, NPO status , Patient's Chart, lab work & pertinent test results, reviewed documented beta blocker date and time   History of Anesthesia Complications (+) PONVNegative for: history of anesthetic complications  Airway Mallampati: III  TM Distance: >3 FB Neck ROM: Full  Mouth opening: Limited Mouth Opening  Dental  (+) Teeth Intact   Pulmonary sleep apnea , former smoker,    Pulmonary exam normal        Cardiovascular hypertension, Pt. on medications and Pt. 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  Reproductive/Obstetrics                             Anesthesia Physical  Anesthesia Plan  ASA: III  Anesthesia Plan: MAC   Post-op Pain Management:    Induction: Intravenous  PONV Risk Score and Plan: 3 and Ondansetron, Treatment may vary due to age or medical condition and Propofol infusion  Airway Management Planned: Nasal Cannula, Simple Face Mask and Natural Airway  Additional Equipment: None  Intra-op Plan:   Post-operative Plan:   Informed Consent: I have reviewed the patients History and Physical, chart, labs and discussed the procedure including the risks, benefits and alternatives for the proposed anesthesia with the patient or authorized representative who has indicated his/her understanding and acceptance.       Plan  Discussed with:   Anesthesia Plan Comments:         Anesthesia Quick Evaluation

## 2019-12-28 NOTE — Op Note (Signed)
12/28/2019  1:04 PM  PATIENT:  Erica Bennett  74 y.o. female  Patient Care Team: Carylon Perches, NP as PCP - [facility] (Inactive) (Internal Medicine) Hennie Duos, MD as Consulting Physician (Rheumatology) Danie Binder, MD (Inactive) as Consulting Physician (Gastroenterology)  PRE-OPERATIVE DIAGNOSIS:  FECAL INCONTIENCE  POST-OPERATIVE DIAGNOSIS:  FECAL INCONTIENCE  PROCEDURE:   INSERTION OF SCRAL NERVE STIMULATOR FINAL PHASE    Surgeon(s): Leighton Ruff, MD  ASSISTANT:   Sheria Lang, MD  ANESTHESIA:   local and MAC  EBL: 5 ml Total I/O In: 300 [I.V.:200; IV Piggyback:100] Out: -   DRAINS: none   SPECIMEN:  No Specimen  DISPOSITION OF SPECIMEN:  N/A  COUNTS:  YES  PLAN OF CARE: Discharge to home after PACU  PATIENT DISPOSITION:  PACU - hemodynamically stable.  INDICATION: Fecal incontinence.  The patient has underwent the nerve implantation test phase and had no episodes while the device was working.  This is a 100% reduction in symptoms.  The patient agrees to proceed with full implantation of the neuro stimulator device.   OR FINDINGS: Wire lead intact.  Impedence values < 1000.  DESCRIPTION: The patient was identified in the preoperative holding area and taken to the OR where they were laid prone on the operating room table.  MAC anesthesia was induced without difficulty. SCDs were also noted to be in place prior to the initiation of anesthesia.  Pillows were placed under lower abdomen to flatten the sacrum and under shins to allow the toes to dangle freely. A ground pad was placed on the bottom of the patient's foot and the proximal ends of the j-hook patient cable were connected to the ground pad and the external neurostimulator (ENS).The patient was then prepped and draped in the usual sterile fashion.  A surgical timeout was performed indicating the correct patient, procedure, positioning and  need for preoperative antibiotics.  The c-arm was moved into AP position to provide fluoroscopic guidance of the sacrum. The medial edges of the foramina were identified and marked. The c-arm was then moved into the lateral position to identify the S3 foramen. Once the needle entry point was determined, local injection of 0.5% Marcaine was administered bilaterally. A foramen needle was placed in the superior, medial aspect of the L S3 foramen and appropriate needle depth was visualized utilizing fluoroscopy. Proper S3 needle location was also confirmed by direct observation of the lifting of the perineum or "bellowing," and plantar flexion of the great toe utilizing the j-hook patient cable, the external neurostimulator and Verify controller. The foramen needle stylet was removed and a directional guide was placed through the needle using markers on the guide to assure appropriate depth. The foramen needle was removed by sliding over the directional guide. A small incision was made peripherally to the directional guide through the skin. The lead introducer with dilator was placed over the directional guide and utilizing fluoroscopic guidance, the lead introducer was advanced until the radiopaque mark was half-way through the foramen. The dilator was removed along with the directional guide. Using fluoroscopy, the tined lead with  (bent) stylet was placed through the introducer until electrodes two and three straddled the anterior surface of the sacrum. All four electrodes were tested, observing "bellows" and plantar flexion of the great toe utilizing the j-hook patient cable and the external neurostimulator with Verify controller. After satisfactory lead positioning was confirmed, the introducer was retracted over the lead under continuous fluoroscopy, deploying the  tines into presacral tissue. Retesting of all four electrodes confirming appropriate responses was completed.   The potential internal  neurostimulator pocket site was identified below the iliac crest and lateral to the sacrum. Local anesthesia was administered and an incision was made into the subcutaneous tissue creating a connection site. Blunt dissection was used to create a small pocket with hemostasis achieved.  A tunneling tool with sheath was placed from the lead exit site subcutaneously to the small incised pocket site. The tunneling tool was removed and the lead was fed through the sheath, exiting at the pocket connection site. The sheath was removed. The lead was cleaned and dried.  An incision was made in the R upper buttock for battery placement.   A subcutaneous pocket was made to accommodate the internal neurostimulator (INS) and hemostasis was established.   The lead was cleaned and dried. The lead was inserted into the header of the InterStim neurostimulator until the blue tip was visualized at the distal window. The single set screw was tightened using the torque wrench until audible click was heard. The neurostimulator was placed into the pocket with the etched identification side placed upwards and any excessive lead was placed around the neurostimulator. The clinician programmer telemetry head, covered in a sterile sleeve, was placed over the implanted neurostimulator to ensure proper lead connection and that parameters were within normal range. Impedances were confirmed to be within normal limits, greater than 50 and less than 4,000 ohms. The wound was irrigated with sterile water and closed with a running 3-0 Vicryl subcuticular suture and a 4-0 Vicryl running skin suture. Counts were correct. Dermabond was placed over the incision. The patient was transferred to the PACU in satisfactory condition. Using the clinician programmer, the INS was programmed.   Patient was provided utilization instructions for the patient programmer prior to discharge.

## 2019-12-28 NOTE — Discharge Instructions (Addendum)
GENERAL SURGERY: POST OP INSTRUCTIONS  1. DIET: Follow a light bland diet the first 24 hours after arrival home, such as soup, liquids, crackers, etc.  Be sure to include lots of fluids daily.  Avoid fast food or heavy meals as your are more likely to get nauseated.   2. Take your usually prescribed home medications unless otherwise directed. 3. PAIN CONTROL: a. Pain is best controlled by a usual combination of three different methods TOGETHER: i. Ice/Heat ii. Over the counter pain medication iii. Prescription pain medication b. Most patients will experience some swelling and bruising around the incisions.  Ice packs or heating pads (30-60 minutes up to 6 times a day) will help. Use ice for the first few days to help decrease swelling and bruising, then switch to heat to help relax tight/sore spots and speed recovery.  Some people prefer to use ice alone, heat alone, alternating between ice & heat.  Experiment to what works for you.  Swelling and bruising can take several weeks to resolve.   c. It is helpful to take an over-the-counter pain medication regularly for the first few weeks.  Choose one of the following that works best for you: i. Naproxen (Aleve, etc)  Two 220mg  tabs twice a day ii. Ibuprofen (Advil, etc) Three 200mg  tabs four times a day (every meal & bedtime) 4. Avoid getting constipated.  Between the surgery and the pain medications, it is common to experience some constipation.  Increasing fluid intake and taking a fiber supplement (such as Metamucil, Citrucel, FiberCon, MiraLax, etc) 1-2 times a day regularly will usually help prevent this problem from occurring.  A mild laxative (prune juice, Milk of Magnesia, MiraLax, etc) should be taken according to package directions if there are no bowel movements after 48 hours.   5. Wash / shower every day.  You may shower over the dressings as they are waterproof.  Continue to shower over incision(s) after the dressing is off. 6. You have  glue over your incisions.  This will wear off in 1-2 weeks.  Ok to shower  7. ACTIVITIES as tolerated:   a. You may resume regular (light) daily activities beginning the next day--such as daily self-care, walking, climbing stairs--gradually increasing activities as tolerated.  If you can walk 30 minutes without difficulty, it is safe to try more intense activity such as jogging, treadmill, bicycling, low-impact aerobics, swimming, etc. b. Save the most intensive and strenuous activity for last such as sit-ups, heavy lifting, contact sports, etc  Refrain from any heavy lifting or straining until you are off narcotics for pain control.   c. DO NOT PUSH THROUGH PAIN.  Let pain be your guide: If it hurts to do something, don't do it.  Pain is your body warning you to avoid that activity for another week until the pain goes down. d. You may drive when you are no longer taking prescription pain medication, you can comfortably wear a seatbelt, and you can safely maneuver your car and apply brakes. e. Erica Bennett may have sexual intercourse when it is comfortable.  8. FOLLOW UP in our office a. Please call [facility] at [phone] to set up an appointment to see your surgeon in the office for a follow-up appointment approximately 2-3 weeks after your surgery. b. Make sure that you call for this appointment the day you arrive home to insure a convenient appointment time. 9. IF YOU HAVE DISABILITY OR FAMILY LEAVE FORMS, BRING THEM TO THE OFFICE FOR PROCESSING.  DO  NOT GIVE THEM TO YOUR DOCTOR.   WHEN TO CALL us [phone]: 1. Poor pain control 2. Reactions / problems with new medications (rash/itching, nausea, etc)  3. Fever over 101.5 F (38.5 C) 4. Worsening swelling or bruising 5. Continued bleeding from incision. 6. Increased pain, redness, or drainage from the incision   The clinic staff is available to answer your questions during regular business hours (8:30am-5pm).  Please don't hesitate to call and ask  to speak to one of our nurses for clinical concerns.   If you have a medical emergency, go to the nearest emergency room or call 911.  A surgeon from [facility] is always on call at the [facility], [place], [place], [place], [place]  [address] ? MAIN: [phone] ? TOLL FREE: [phone] ?  FAX [phone] [email or web address]   Post Anesthesia Home Care Instructions  Activity: Get plenty of rest for the remainder of the day. A responsible adult should stay with you for 24 hours following the procedure.  For the next 24 hours, DO NOT: -Drive a car -Paediatric nurse -Drink alcoholic beverages -Take any medication unless instructed by your physician -Make any legal decisions or sign important papers.  Meals: Start with liquid foods such as gelatin or soup. Progress to regular foods as tolerated. Avoid greasy, spicy, heavy foods. If nausea and/or vomiting occur, drink only clear liquids until the nausea and/or vomiting subsides. Call your physician if vomiting continues.  Special Instructions/Symptoms: Your throat may feel dry or sore from the anesthesia or the breathing tube placed in your throat during surgery. If this causes discomfort, gargle with warm salt water. The discomfort should disappear within 24 hours.  If you had a scopolamine patch placed behind your ear for the management of post- operative nausea and/or vomiting:  1. The medication in the patch is effective for 72 hours, after which it should be removed.  Wrap patch in a tissue and discard in the trash. Wash hands thoroughly with soap and water. 2. You may remove the patch earlier than 72 hours if you experience unpleasant side effects which may include dry mouth, dizziness or visual disturbances. 3. Avoid touching the patch. Wash your hands with soap and water after contact with the patch.

## 2019-12-28 NOTE — Anesthesia Procedure Notes (Signed)
Procedure Name: MAC Date/Time: 12/28/2019 12:12 PM Performed by: Mechele Claude, CRNA Pre-anesthesia Checklist: Patient identified, Emergency Drugs available, Suction available, Patient being monitored and Timeout performed Oxygen Delivery Method: Simple face mask Placement Confirmation: positive ETCO2 and breath sounds checked- equal and bilateral

## 2020-01-03 DIAGNOSIS — G894 Chronic pain syndrome: Secondary | ICD-10-CM | POA: Diagnosis not present

## 2020-01-03 DIAGNOSIS — Z79899 Other long term (current) drug therapy: Secondary | ICD-10-CM | POA: Diagnosis not present

## 2020-01-03 DIAGNOSIS — Z760 Encounter for issue of repeat prescription: Secondary | ICD-10-CM | POA: Diagnosis not present

## 2020-01-03 DIAGNOSIS — M2550 Pain in unspecified joint: Secondary | ICD-10-CM | POA: Diagnosis not present

## 2020-01-09 ENCOUNTER — Other Ambulatory Visit (HOSPITAL_COMMUNITY): Payer: Self-pay | Admitting: Psychiatry

## 2020-01-11 ENCOUNTER — Telehealth (INDEPENDENT_AMBULATORY_CARE_PROVIDER_SITE_OTHER): Payer: Medicare Other | Admitting: Psychiatry

## 2020-01-11 ENCOUNTER — Other Ambulatory Visit: Payer: Self-pay

## 2020-01-11 ENCOUNTER — Encounter (HOSPITAL_COMMUNITY): Payer: Self-pay | Admitting: Psychiatry

## 2020-01-11 DIAGNOSIS — F321 Major depressive disorder, single episode, moderate: Secondary | ICD-10-CM | POA: Diagnosis not present

## 2020-01-11 MED ORDER — ALPRAZOLAM 1 MG PO TABS
1.0000 mg | ORAL_TABLET | Freq: Every day | ORAL | 3 refills | Status: DC
Start: 2020-01-11 — End: 2020-04-15

## 2020-01-11 MED ORDER — DULOXETINE HCL 20 MG PO CPEP
ORAL_CAPSULE | ORAL | 3 refills | Status: DC
Start: 2020-01-11 — End: 2020-04-15

## 2020-01-11 NOTE — Progress Notes (Signed)
Virtual Visit via Telephone Note  I connected with Erica Bennett on 01/11/20 at  1:40 PM EST by telephone and verified that I am speaking with the correct person using two identifiers.  Location: Patient: home Provider: office   I discussed the limitations, risks, security and privacy concerns of performing an evaluation and management service by telephone and the availability of in person appointments. I also discussed with the patient that there may be a patient responsible charge related to this service. The patient expressed understanding and agreed to proceed.    I discussed the assessment and treatment plan with the patient. The patient was provided an opportunity to ask questions and all were answered. The patient agreed with the plan and demonstrated an understanding of the instructions.   The patient was advised to call back or seek an in-person evaluation if the symptoms worsen or if the condition fails to improve as anticipated.  I provided 15 minutes of non-face-to-face time during this encounter.   Erica Spiller, MD  [facility] MD/PA/NP OP Progress Note  01/11/2020 1:57 PM Erica Bennett  MRN:  [id number]  Chief Complaint:  Chief Complaint    Anxiety; Depression; Follow-up     HPI: this patient is a74 year old married white female who lives with her husband in [place]. They've been married for 14years. She has no children. She is on disability for congenital blindness.  The patient was referred by Dr.Hosanji, her primary physician, for further assessment of depression anxiety.  The patient states that her mother contracted rubella when she was pregnant with her. She developed congenital blindness that worsened in her 72s with severe macular degeneration. By her 72s she was totally blind. 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  She has learned Braille and gets help through the [employer]. She was very comfortable doing things for herself. Both her parents are deceased and she doesn't have any other family. 11 years ago she reconnected with a man that she grown up with all her life. He had a history of alcoholism but claimed he had been sober for 5 years. They got married initially it went okay but then he began drinking. He goes through alcoholic bouts that can last months to years and he usually drinks about a half gallon of vodka a day. He last went through treatment last June but around March of this year he started drinking again. She felt very taken advantage of that she can't see what he is doing but she has been finding the bottles in confronting him and he is been lying about it. This is made her very uncomfortable. He is her power of attorney and they share a bank account. They've been arguing a lot more and she is become increasingly anxious and somewhat depressed.  The patient did have some prior treatment in her 74s when she went blind. She saw psychiatrist to help deal with the change and also took Paxil for a while. She's not had any treatment since. She's very active in [employer] and has several good friends from the program. She knows that she should detach from her alcoholic husband but it's difficult to do so because of her dependency on him.  The patient has always had difficulty sleeping because of the circadian problem with being blind. She has  tried [place] and Ambien but they didn't help and the newer drugs for this are extremely expensive. She is on a low dose of Xanax which is helped a little bit but she only gets about 3-4 hours of sleep a night. She is tired through the day, her energy is low. She denies suicidal ideation. She enjoys doing things with friends but will often walk him around because of her husbands behavior. She herself does not drink or use drugs  and she's never had psychotic symptoms  The patient returns for follow-up after 3 months.  She recently had a sacral nerve stimulator put in due to fecal incontinence.  She states it is helping a lot and it is working great.  Is also relieved her low back pain.  She states that she and her husband are getting along better.  She is sleeping well with the Xanax and her mood is good.  The Cymbalta has helped a lot of her aches and pains and because of this she is less irritable. Visit Diagnosis:    ICD-10-CM   1. Moderate single current episode of major depressive disorder ([place])  F32.1     Past Psychiatric History: Prior outpatient treatment  Past Medical History:  Past Medical History:  Diagnosis Date  . Anxiety   . Blindness of both eyes    due to degenerative retina and acute glaucoma  . Complication of anesthesia    pt has limited mouth opening due to bilateral tmj surgery  . Fecal incontinence   . Fibromyalgia   . Generalized seizure disorder [facility])    neurologist-- dr Jannifer Franklin--  seizure is staring off and dropping things  (12-20-2019  per pt last seizure approx. 2005)  . GERD (gastroesophageal reflux disease)   . H/O Hashimoto thyroiditis   . History of acute angle-closure glaucoma    bilateral eyes  s/p eye removal, now has prosthesis  . History of hypertension   . History of skin cancer   . History of thyroid nodule    bx left thyroid nodule 11-17-2017;  s/p left thyroidectomy 11-07-2018, benign mass  . Hyperlipidemia   . Limitation of opening of mouth    per pt due to bilateral tmj surgery 1989  . MDD (major depressive disorder)   . Migraines   . Mixed connective tissue disease [facility])    rheumotologist-- dr Amil Amen--  taking plaquenil  . OA (osteoarthritis)   . OSA (obstructive sleep apnea)    does not use CPAP  . Osteoporosis   . PONV (postoperative nausea and vomiting)    and hard to wake  . Prosthetic eye globe    bilateral  . 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 ABDOMINAL HYSTERECTOMY  1980s   AND APPENDECTOMY  . ENUCLEATION Right 1984   right eye  . EVISCERATION Left 1996   left eye  . LAPAROSCOPIC CHOLECYSTECTOMY  1993  . SHOULDER ARTHROSCOPY WITH SUBACROMIAL DECOMPRESSION Right 09-15-2007  _0    AND BURSECTOMY/  DEBRIDEMENT ROTATOR CUFF  . TEMPOROMANDIBULAR JOINT SURGERY Bilateral 1989  . THYROIDECTOMY Left 11/07/2018   Procedure: LEFT HEMI THYROIDECTOMY;  Surgeon: Leta Baptist, MD;  Location: [place];  Service: ENT;  Laterality: Left;  . TONSILLECTOMY  child    Family Psychiatric History: see below  Family History:  Family History  Problem Relation Age  of Onset  . Depression Paternal Aunt   . Alcohol abuse Paternal Uncle   . Drug abuse Paternal Uncle   . Depression Cousin   . Depression Maternal Aunt   . Colon cancer Mother   . Colon cancer Maternal Aunt   . Colon cancer Other     Social History:  Social History   Socioeconomic History  . Marital status: Married    Spouse name: Not on file  . Number of children: 0  . Years of education: 18  . Highest education level: Not on file  Occupational History  . Occupation: Retired  Tobacco Use  . Smoking status: Former Smoker    Years: 20.00    Types: Cigarettes    Quit date: 01/03/1992    Years since quitting: 28.0  . Smokeless tobacco: Never Used  Vaping Use  . Vaping Use: Never used  Substance and Sexual Activity  . Alcohol use: No    Alcohol/week: 0.0 standard drinks  . Drug use: Never  . Sexual activity: Yes    Birth control/protection: Surgical  Other Topics Concern  . Not on file  Social History Narrative   [employer]-[place]   Caffeine use: daily (1 soda per day, 1 coffee per day)   Left handed    Legally blind      Worked as LPN/RN for about 8 years   Social Determinants of Health   Financial Resource  Strain: Not on file  Food Insecurity: Not on file  Transportation Needs: Not on file  Physical Activity: Not on file  Stress: Not on file  Social Connections: Not on file    Allergies:  Allergies  Allergen Reactions  . Bacitracin Rash  . Neomycin Rash  . Sulfa Antibiotics Swelling and Rash  . Sulfamethoxazole Swelling and Other (See Comments)  . Eggs Or Egg-Derived Products Diarrhea and Nausea And Vomiting    Stomach cramps  . Elavil [Amitriptyline Hcl] Other (See Comments)    Really bad tremors  . Gabapentin Other (See Comments)    Balance issues  . Prozac [Fluoxetine Hcl]     Severe headache    Metabolic Disorder Labs: No results found for: HGBA1C, MPG No results found for: PROLACTIN Lab Results  Component Value Date   CHOL 203 (H) 11/18/2017   TRIG 227 (H) 11/18/2017   HDL 51 11/18/2017   CHOLHDL 4.0 11/18/2017   LDLCALC 107 (H) 11/18/2017   Lab Results  Component Value Date   TSH 2.940 03/02/2018   TSH 1.56 10/26/2017    Therapeutic Level Labs: No results found for: LITHIUM No results found for: VALPROATE No components found for:  CBMZ  Current Medications: Current Outpatient Medications  Medication Sig Dispense Refill  . ALPRAZolam (XANAX) 1 MG tablet Take 1 tablet (1 mg total) by mouth at bedtime. 30 tablet 3  . CARBATROL 200 MG 12 hr capsule Take 1 capsule (200 mg total) by mouth daily. (Patient taking differently: Take 200 mg by mouth at bedtime. ) 90 capsule 3  . DEXILANT 60 MG capsule TAKE ONE CAPSULE BY MOUTH DAILY; ON AN EMPTY STOMACH, THEN DO NOT EAT FOR ONE HOUR. (Patient taking differently: Take 60 mg by mouth daily before breakfast. ) 30 capsule 5  . DULoxetine (CYMBALTA) 20 MG capsule TAKE TWO (2) CAPSULES BY MOUTH DAILY 60 capsule 3  . ECHINACEA PO Take 1,520 mg by mouth daily. 760 mg per capsule    . ergocalciferol (VITAMIN D2) 1.25 MG (50000 UT) capsule Take 50,000 Units by   mouth once a week. Sunday's    . HYDROcodone-acetaminophen  (NORCO/VICODIN) 5-325 MG tablet Take 1 tablet by mouth every 6 (six) hours as needed for moderate pain.    . hydroxychloroquine (PLAQUENIL) 200 MG tablet Take 1 tablet (200 mg total) by mouth 2 (two) times daily. (Patient taking differently: Take 200 mg by mouth 2 (two) times daily. ) 180 tablet 1  . MAXALT 10 MG tablet Take 1 tablet (10 mg total) by mouth as needed. (Patient taking differently: Take 10 mg by mouth 3 (three) times daily as needed for migraine. ) 10 tablet 5  . metoprolol succinate (TOPROL-XL) 100 MG 24 hr tablet Take 1 tablet (100 mg total) by mouth every evening. (Patient taking differently: Take 100 mg by mouth every evening. ) 90 tablet 0  . NARCAN 4 MG/0.1ML LIQD nasal spray kit Place 1 spray into the nose as needed (accidental overdose.).     Marland Kitchen NIACIN PO Take by mouth daily.     No current facility-administered medications for this visit.     Musculoskeletal: Strength & Muscle Tone: within normal limits Gait & Station: normal Patient leans: N/A  Psychiatric Specialty Exam: Review of Systems  Musculoskeletal: Positive for arthralgias and myalgias.  All other systems reviewed and are negative.   There were no vitals taken for this visit.There is no height or weight on file to calculate BMI.  General Appearance: NA  Eye Contact:  NA  Speech:  Clear and Coherent  Volume:  Normal  Mood:  Euthymic  Affect:  NA  Thought Process:  Goal Directed  Orientation:  Full (Time, Place, and Person)  Thought Content: WDL   Suicidal Thoughts:  No  Homicidal Thoughts:  No  Memory:  Immediate;   Good Recent;   Good Remote;   Good  Judgement:  Good  Insight:  Good  Psychomotor Activity:  Normal  Concentration:  Concentration: Good and Attention Span: Good  Recall:  Good  Fund of Knowledge: Good  Language: Good  Akathisia:  No  Handed:  Right  AIMS (if indicated): not done  Assets:  Communication Skills Desire for Improvement Resilience Social Support Talents/Skills   ADL's:  Intact  Cognition: WNL  Sleep:  Good   Screenings: GAD-7   Flowsheet Row Counselor from 03/30/2019 in [place] [facility]  Total GAD-7 Score 2    PHQ2-9   Flowsheet Row Counselor from 03/30/2019 in [place] Office Visit from 03/18/2018 in [place] Office Visit from 02/16/2018 in [place] Visit from 11/16/2017 in [place]  PHQ-2 Total Score 0 3 3 0  PHQ-9 Total Score -- 11 12 --       Assessment and Plan: This patient is a 74 year old female with a history of congenital blindness, connective tissue disorder fibromyalgia depression and anxiety.  She is doing well on her current regimen.  She will continue Cymbalta 40 mg daily for depression and chronic pain as well as Xanax 1 mg at bedtime to help with anxiety and sleep.  She will return to see me in 4 months   Erica Spiller, MD 01/11/2020, 1:57 PM

## 2020-01-23 DIAGNOSIS — J069 Acute upper respiratory infection, unspecified: Secondary | ICD-10-CM | POA: Diagnosis not present

## 2020-01-23 DIAGNOSIS — B373 Candidiasis of vulva and vagina: Secondary | ICD-10-CM | POA: Diagnosis not present

## 2020-01-23 DIAGNOSIS — Z9189 Other specified personal risk factors, not elsewhere classified: Secondary | ICD-10-CM | POA: Diagnosis not present

## 2020-04-15 ENCOUNTER — Telehealth (INDEPENDENT_AMBULATORY_CARE_PROVIDER_SITE_OTHER): Payer: Medicare Other | Admitting: Psychiatry

## 2020-04-15 ENCOUNTER — Encounter (HOSPITAL_COMMUNITY): Payer: Self-pay | Admitting: Psychiatry

## 2020-04-15 ENCOUNTER — Other Ambulatory Visit: Payer: Self-pay

## 2020-04-15 DIAGNOSIS — F321 Major depressive disorder, single episode, moderate: Secondary | ICD-10-CM | POA: Diagnosis not present

## 2020-04-15 MED ORDER — DULOXETINE HCL 60 MG PO CPEP: 60.0000 mg | ORAL_CAPSULE | Freq: Every day | ORAL | 3 refills | Status: DC

## 2020-04-15 MED ORDER — ALPRAZOLAM 1 MG PO TABS
1.0000 mg | ORAL_TABLET | Freq: Every day | ORAL | 3 refills | Status: DC
Start: 2020-04-15 — End: 2020-05-31

## 2020-04-15 NOTE — Progress Notes (Signed)
Virtual Visit via Telephone Note  I connected with Erica Bennett on 04/15/20 at  1:20 PM EDT by telephone and verified that I am speaking with the correct person using two identifiers.  Location: Patient: home Provider: home   I discussed the limitations, risks, security and privacy concerns of performing an evaluation and management service by telephone and the availability of in person appointments. I also discussed with the patient that there may be a patient responsible charge related to this service. The patient expressed understanding and agreed to proceed.    I discussed the assessment and treatment plan with the patient. The patient was provided an opportunity to ask questions and all were answered. The patient agreed with the plan and demonstrated an understanding of the instructions.   The patient was advised to call back or seek an in-person evaluation if the symptoms worsen or if the condition fails to improve as anticipated.  I provided 15 minutes of non-face-to-face time during this encounter.   Erica Spiller, MD  [facility] MD/PA/NP OP Progress Note  04/15/2020 1:28 PM Erica Bennett  MRN:  [id number]  Chief Complaint:  Chief Complaint    Anxiety; Depression; Follow-up     HPI: this patient is a75 year old married white female who lives with her husband in [place]. They've been married for 14years. She has no children. She is on disability for congenital blindness.  The patient was referred by Dr.Hosanji, her primary physician, for further assessment of depression anxiety.  The patient states that her mother contracted rubella when she was pregnant with her. She developed congenital blindness that worsened in her 73s with severe macular degeneration. By her 29s she was totally blind. 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  She has learned Braille and gets help through the [employer]. She was very comfortable doing things for herself. Both her parents are deceased and she doesn't have any other family. 11 years ago she reconnected with a man that she grown up with all her life. He had a history of alcoholism but claimed he had been sober for 5 years. They got married initially it went okay but then he began drinking. He goes through alcoholic bouts that can last months to years and he usually drinks about a half gallon of vodka a day. He last went through treatment last June but around March of this year he started drinking again. She felt very taken advantage of that she can't see what he is doing but she has been finding the bottles in confronting him and he is been lying about it. This is made her very uncomfortable. He is her power of attorney and they share a bank account. They've been arguing a lot more and she is become increasingly anxious and somewhat depressed.  The patient did have some prior treatment in her 73s when she went blind. She saw psychiatrist to help deal with the change and also took Paxil for a while. She's not had any treatment since. She's very active in [employer] and has several good friends from the program. She knows that she should detach from her alcoholic husband but it's difficult to do so because of her dependency on him.  The patient has always had difficulty sleeping because of the circadian problem with being blind. She has  tried [place] and Ambien but they didn't help and the newer drugs for this are extremely expensive. She is on a low dose of Xanax which is helped a little bit but she only gets about 3-4 hours of sleep a night. She is tired through the day, her energy is low. She denies suicidal ideation. She enjoys doing things with friends but will often walk him around because of her husbands behavior. She herself does not drink or use drugs  and she's never had psychotic symptoms  Patient returns for follow-up after 3 months.  She continues to do fairly well.  Her fibromyalgia has flared up and her rheumatologist suggested an increase of Cymbalta to 60 mg.  She had the 20 mg dose at home and is now taking 3 of them.  She feels like it is helpful.  I explained that we could do it in just 1 capsule of 60 mg.  She still has her aches and pains but now that she has had the spinal stimulator put and she is feeling much better and is able to do housework cooking etc.  She and her husband are getting along well.  She is sleeping well with the Xanax Visit Diagnosis:    ICD-10-CM   1. Moderate single current episode of major depressive disorder ([place])  F32.1     Past Psychiatric History: Prior outpatient treatment  Past Medical History:  Past Medical History:  Diagnosis Date  . Anxiety   . Blindness of both eyes    due to degenerative retina and acute glaucoma  . Complication of anesthesia    pt has limited mouth opening due to bilateral tmj surgery  . Fecal incontinence   . Fibromyalgia   . Generalized seizure disorder [facility])    neurologist-- dr Jannifer Franklin--  seizure is staring off and dropping things  (12-20-2019  per pt last seizure approx. 2005)  . GERD (gastroesophageal reflux disease)   . H/O Hashimoto thyroiditis   . History of acute angle-closure glaucoma    bilateral eyes  s/p eye removal, now has prosthesis  . History of hypertension   . History of skin cancer   . History of thyroid nodule    bx left thyroid nodule 11-17-2017;  s/p left thyroidectomy 11-07-2018, benign mass  . Hyperlipidemia   . Limitation of opening of mouth    per pt due to bilateral tmj surgery 1989  . MDD (major depressive disorder)   . Migraines   . Mixed connective tissue disease [facility])    rheumotologist-- dr Amil Amen--  taking plaquenil  . OA (osteoarthritis)   . OSA (obstructive sleep apnea)    does not use CPAP  . Osteoporosis   . PONV  (postoperative nausea and vomiting)    and hard to wake  . Prosthetic eye globe    bilateral  . 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 ABDOMINAL HYSTERECTOMY  1980s   AND APPENDECTOMY  . ENUCLEATION Right 1984   right eye  . EVISCERATION Left 1996   left eye  . LAPAROSCOPIC CHOLECYSTECTOMY  1993  . SHOULDER ARTHROSCOPY WITH SUBACROMIAL DECOMPRESSION Right 09-15-2007  $[email or web address]    AND BURSECTOMY/  DEBRIDEMENT ROTATOR CUFF  . TEMPOROMANDIBULAR JOINT SURGERY Bilateral 1989  . THYROIDECTOMY Left 11/07/2018   Procedure: LEFT HEMI THYROIDECTOMY;  Surgeon: Leta Baptist, MD;  Location: [facility];  Service: ENT;  Laterality: Left;  . TONSILLECTOMY  child    Family Psychiatric History: see below  Family History:  Family History  Problem Relation Age of Onset  . Depression Paternal Aunt   . Alcohol abuse Paternal Uncle   . Drug abuse Paternal Uncle   . Depression Cousin   . Depression Maternal Aunt   . Colon cancer Mother   . Colon cancer Maternal Aunt   . Colon cancer Other     Social History:  Social History   Socioeconomic History  . Marital status: Married    Spouse name: Not on file  . Number of children: 0  . Years of education: 54  . Highest education level: Not on file  Occupational History  . Occupation: Retired  Tobacco Use  . Smoking status: Former Smoker    Years: 20.00    Types: Cigarettes    Quit date: 01/03/1992    Years since quitting: 28.3  . Smokeless tobacco: Never Used  Vaping Use  . Vaping Use: Never used  Substance and Sexual Activity  . Alcohol use: No    Alcohol/week: 0.0 standard drinks  . Drug use: Never  . Sexual activity: Yes    Birth control/protection: Surgical  Other Topics Concern  . Not on file  Social History Narrative   [employer]-[place]   Caffeine use: daily (1 soda per day, 1 coffee per day)    Left handed    Legally blind      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 broadcast assistant Strain: Not on file  Food Insecurity: Not on file  Transportation Needs: Not on file  Physical Activity: Not on file  Stress: Not on file  Social Connections: Not on file    Allergies:  Allergies  Allergen Reactions  . Bacitracin Rash  . Neomycin Rash  . Sulfa Antibiotics Swelling and Rash  . Sulfamethoxazole Swelling and Other (See Comments)  . Eggs Or Egg-Derived Products Diarrhea and Nausea And Vomiting    Stomach cramps  . Elavil [Amitriptyline Hcl] Other (See Comments)    Really bad tremors  . Gabapentin Other (See Comments)    Balance issues  . Prozac [Fluoxetine Hcl]     Severe headache    Metabolic Disorder Labs: No results found for: HGBA1C, MPG No results found for: PROLACTIN Lab Results  Component Value Date   CHOL 203 (H) 11/18/2017   TRIG 227 (H) 11/18/2017   HDL 51 11/18/2017   CHOLHDL 4.0 11/18/2017   LDLCALC 107 (H) 11/18/2017   Lab Results  Component Value Date   TSH 2.940 03/02/2018   TSH 1.56 10/26/2017    Therapeutic Level Labs: No results found for: LITHIUM No results found for: VALPROATE No components found for:  CBMZ  Current Medications: Current Outpatient Medications  Medication Sig Dispense Refill  . DULoxetine (CYMBALTA) 60 MG capsule Take 1 capsule (60 mg total) by mouth daily. 90 capsule 3  . ALPRAZolam (XANAX) 1 MG tablet Take 1 tablet (1 mg total) by mouth at bedtime. 30 tablet 3  . CARBATROL 200 MG 12 hr capsule Take 1 capsule (200 mg total) by mouth daily. (Patient taking differently: Take 200 mg by mouth at bedtime. ) 90 capsule 3  . DEXILANT 60 MG capsule TAKE ONE CAPSULE BY MOUTH DAILY; ON AN EMPTY STOMACH, THEN DO NOT EAT FOR ONE HOUR. (Patient taking differently: Take 60 mg by mouth daily before breakfast. ) 30  capsule 5  . ECHINACEA PO Take 1,520 mg by mouth daily. 760 mg per capsule    .  ergocalciferol (VITAMIN D2) 1.25 MG (50000 UT) capsule Take 50,000 Units by mouth once a week. Sunday's    . HYDROcodone-acetaminophen (NORCO/VICODIN) 5-325 MG tablet Take 1 tablet by mouth every 6 (six) hours as needed for moderate pain.    . hydroxychloroquine (PLAQUENIL) 200 MG tablet Take 1 tablet (200 mg total) by mouth 2 (two) times daily. (Patient taking differently: Take 200 mg by mouth 2 (two) times daily. ) 180 tablet 1  . MAXALT 10 MG tablet Take 1 tablet (10 mg total) by mouth as needed. (Patient taking differently: Take 10 mg by mouth 3 (three) times daily as needed for migraine. ) 10 tablet 5  . metoprolol succinate (TOPROL-XL) 100 MG 24 hr tablet Take 1 tablet (100 mg total) by mouth every evening. (Patient taking differently: Take 100 mg by mouth every evening. ) 90 tablet 0  . NARCAN 4 MG/0.1ML LIQD nasal spray kit Place 1 spray into the nose as needed (accidental overdose.).     Marland Kitchen NIACIN PO Take by mouth daily.     No current facility-administered medications for this visit.     Musculoskeletal: Strength & Muscle Tone: within normal limits Gait & Station: normal Patient leans: N/A  Psychiatric Specialty Exam: Review of Systems  Eyes: Positive for visual disturbance.  Musculoskeletal: Positive for arthralgias, joint swelling and myalgias.  All other systems reviewed and are negative.   There were no vitals taken for this visit.There is no height or weight on file to calculate BMI.  General Appearance: NA  Eye Contact:  NA  Speech:  Clear and Coherent  Volume:  Normal  Mood:  Euthymic  Affect:  NA  Thought Process:  Goal Directed  Orientation:  Full (Time, Place, and Person)  Thought Content: WDL   Suicidal Thoughts:  No  Homicidal Thoughts:  No  Memory:  Immediate;   Good Recent;   Good Remote;   Good  Judgement:  Good  Insight:  Good  Psychomotor Activity:  Normal  Concentration:  Concentration: Good and Attention Span: Good  Recall:  Good  Fund of  Knowledge: Good  Language: Good  Akathisia:  No  Handed:  Right  AIMS (if indicated): not done  Assets:  Communication Skills Desire for Improvement Resilience Social Support Talents/Skills  ADL's:  Intact  Cognition: WNL  Sleep:  Good   Screenings: GAD-7   Flowsheet Row Counselor from 03/30/2019 in [facility]  Total GAD-7 Score 2    PHQ2-9   Flowsheet Row Video Visit from 04/15/2020 in [place] Counselor from 03/30/2019 in [place] Office Visit from 03/18/2018 in [place] Office Visit from 02/16/2018 in [place] Visit from 11/16/2017 in [place]  PHQ-2 Total Score 0 0 3 3 0  PHQ-9 Total Score - - 11 12 -    Flowsheet Row Video Visit from 04/15/2020 in [place] No Risk       Assessment and Plan: This patient is a 75 year old female with a history of congenital blindness, connective tissue disorder, fibromyalgia depression and anxiety as well as rheumatoid arthritis.  She has gone up on the Cymbalta and I will continue this at 60 mg daily for depression and chronic pain as well as Xanax 1 mg at bedtime to  up with anxiety and sleep.  She will return to see me in 3 months   Erica Spiller, MD 04/15/2020, 1:28 PM

## 2020-05-01 ENCOUNTER — Telehealth (HOSPITAL_COMMUNITY): Payer: Self-pay | Admitting: *Deleted

## 2020-05-01 NOTE — Telephone Encounter (Signed)
LMOM for patient to call office for f/u appt

## 2020-05-08 ENCOUNTER — Telehealth (HOSPITAL_COMMUNITY): Payer: Medicare Other | Admitting: Psychiatry

## 2020-05-13 ENCOUNTER — Telehealth (HOSPITAL_COMMUNITY): Payer: Medicare Other | Admitting: Psychiatry

## 2020-05-20 ENCOUNTER — Other Ambulatory Visit: Payer: Self-pay | Admitting: Neurology

## 2020-05-20 DIAGNOSIS — G40409 Other generalized epilepsy and epileptic syndromes, not intractable, without status epilepticus: Secondary | ICD-10-CM

## 2020-05-28 ENCOUNTER — Telehealth (HOSPITAL_COMMUNITY): Payer: Self-pay | Admitting: Psychiatry

## 2020-05-28 NOTE — Telephone Encounter (Signed)
Patient called in, did leave vm advising her husband committed suicide this past weekend. Patient tearfully said she thinks she is okay and broke down crying on voicemail. Pt stated she wanted you both to know.

## 2020-05-28 NOTE — Telephone Encounter (Signed)
Please schedule her with me and Peggy this week if possible

## 2020-05-28 NOTE — Telephone Encounter (Signed)
Therapist called patient in response to message about patient's husband dying by suicide this past weekend.  Patient reports support from 2 friends as well of 2 local churches.  Husband's service is tomorrow and patient is finalizing arrangements today.  Therapist expressed condolences and encouraged patient to use support system.  Therapist also encouraged patient to contact therapist should she want to schedule appointment.

## 2020-05-29 ENCOUNTER — Telehealth (HOSPITAL_COMMUNITY): Payer: Self-pay | Admitting: Psychiatry

## 2020-05-29 NOTE — Telephone Encounter (Signed)
Called to schedule f/u appt, left vm 

## 2020-05-31 ENCOUNTER — Telehealth (INDEPENDENT_AMBULATORY_CARE_PROVIDER_SITE_OTHER): Payer: Medicare Other | Admitting: Psychiatry

## 2020-05-31 ENCOUNTER — Encounter (HOSPITAL_COMMUNITY): Payer: Self-pay | Admitting: Psychiatry

## 2020-05-31 DIAGNOSIS — F321 Major depressive disorder, single episode, moderate: Secondary | ICD-10-CM

## 2020-05-31 MED ORDER — ALPRAZOLAM 1 MG PO TABS
1.0000 mg | ORAL_TABLET | Freq: Two times a day (BID) | ORAL | 3 refills | Status: DC
Start: 2020-05-31 — End: 2020-07-16

## 2020-05-31 MED ORDER — DULOXETINE HCL 60 MG PO CPEP
60.0000 mg | ORAL_CAPSULE | Freq: Every day | ORAL | 3 refills | Status: DC
Start: 2020-05-31 — End: 2020-07-16
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
## 2020-06-28 ENCOUNTER — Ambulatory Visit (HOSPITAL_COMMUNITY): Payer: Medicare Other | Admitting: Psychiatry

## 2020-07-01 ENCOUNTER — Ambulatory Visit (INDEPENDENT_AMBULATORY_CARE_PROVIDER_SITE_OTHER): Payer: Medicare Other | Admitting: Psychiatry

## 2020-07-01 ENCOUNTER — Other Ambulatory Visit: Payer: Self-pay

## 2020-07-01 DIAGNOSIS — F321 Major depressive disorder, single episode, moderate: Secondary | ICD-10-CM | POA: Diagnosis not present
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
## 2020-07-15 ENCOUNTER — Other Ambulatory Visit: Payer: Self-pay

## 2020-07-15 ENCOUNTER — Encounter: Payer: Self-pay | Admitting: Neurology

## 2020-07-15 ENCOUNTER — Ambulatory Visit (INDEPENDENT_AMBULATORY_CARE_PROVIDER_SITE_OTHER): Payer: Medicare Other | Admitting: Neurology

## 2020-07-15 VITALS — BP 110/81 | HR 70 | Ht 65.0 in | Wt 165.6 lb

## 2020-07-15 DIAGNOSIS — R569 Unspecified convulsions: Secondary | ICD-10-CM

## 2020-07-15 NOTE — Progress Notes (Signed)
PATIENT: Erica Bennett DOB: 13-May-1945  REASON FOR VISIT: follow up HISTORY FROM: patient  HISTORY OF PRESENT ILLNESS: Today 07/15/20 Erica Bennett is a 75 year old female with history of seizure-like events. She is blind, has artifical eyes.  Remains on brand-name Carbatrol 200 mg twice daily.  No seizures since last seen.  Carbamazepine level was therapeutic in October 2021 at 6.1, CMP was unremarkable. Her husband committed suicide back in April. She is now living alone, seems to be doing okay adjusting to this. She has 2 friends who help her, friends from church drive her to appointments. She has no relatives left or children. 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 Seizures well controlled, reports 1 seizure in 17 years. Sees PCP. Getting social service consult, came out and labeled things (air fryer).  Worried, may be developing PD, intermittent tremor, both hands, at rest or action. Balance isn't great, hard to tell since blind, no sense of smell for years. Strong family history of PD.  Update 11/14/2019 SS: Erica Bennett is a 75 year old female with history of seizure-like events, mixed connective tissue disease taking Plaquenil.  MRI of the brain has been unremarkable.  In the past her seizure events have been described as staring off, she will drop things.  In February 2020, found to have low sodium, dose of Carbatrol was decreased from 200 mg twice a day, to 200 mg twice a day.  Since last seen, she had one "funny" sensation of near seizure, brought on by paper movement from a magazine in a doctor's office waiting room.  No seizure actually occurred.  She has fibromyalgia, is now on Cymbalta.  She is blind, has artificial eyes.  She requires brand-name Carbatrol, had breakthrough seizures on generic.  In 17 years, her husband has only seen her have 1 seizure.  Has had some rectal sphincter issues, having an implant.  Presents today for evaluation accompanied by her husband.    HISTORY 03/14/2019 SS: Erica Bennett is a 75 year old female with history of seizure-like events, mixed connective tissue disease taking Plaquenil for this.  MRI of the brain has been unremarkable.  In the past, her seizure events have been described as staring off, she would drop things.  Her last seizure occurred 16 years ago.  She says in the interim she has been diagnosed with fibromyalgia.  She is blind, has artificial eyes. She has remained on lower dose of carbamazepine, as result of low sodium level.  She has done well with this.  She does require brand-name Carbatrol, when on generic, says she has breakthrough seizures.  She presents today for evaluation accompanied by her husband.    REVIEW OF SYSTEMS: Out of a complete 14 system review of symptoms, the patient complains only of the following symptoms, and all other reviewed systems are negative.  N/A  ALLERGIES: Allergies  Allergen Reactions   Bacitracin Rash   Neomycin Rash   Sulfa Antibiotics Swelling and Rash   Sulfamethoxazole Swelling and Other (See Comments)   Eggs Or Egg-Derived Products Diarrhea and Nausea And Vomiting    Stomach cramps   Elavil [Amitriptyline Hcl] Other (See Comments)    Really bad tremors   Gabapentin Other (See Comments)    Balance issues   Prozac [Fluoxetine Hcl]     Severe headache    HOME MEDICATIONS: Outpatient Medications Prior to Visit  Medication Sig Dispense Refill   ALPRAZolam (XANAX) 1 MG tablet Take 1 tablet (1 mg total) by mouth 2 (two) times  daily. 60 tablet 3   CARBATROL 200 MG 12 hr capsule TAKE ONE CAPSULE BY MOUTH DAILY. 90 capsule 3   DEXILANT 60 MG capsule TAKE ONE CAPSULE BY MOUTH DAILY; ON AN EMPTY STOMACH, THEN DO NOT EAT FOR ONE HOUR. (Patient taking differently: Take 60 mg by mouth daily before breakfast.) 30 capsule 5   DULoxetine (CYMBALTA) 60 MG capsule Take 1 capsule (60 mg total) by mouth daily. 90 capsule 3   HYDROcodone-acetaminophen (NORCO/VICODIN) 5-325 MG tablet  Take 1 tablet by mouth every 6 (six) hours as needed for moderate pain.     hydroxychloroquine (PLAQUENIL) 200 MG tablet Take 1 tablet (200 mg total) by mouth 2 (two) times daily. (Patient taking differently: Take 200 mg by mouth 2 (two) times daily.) 180 tablet 1   MAXALT 10 MG tablet Take 1 tablet (10 mg total) by mouth as needed. (Patient taking differently: Take 10 mg by mouth 3 (three) times daily as needed for migraine.) 10 tablet 5   metoprolol succinate (TOPROL-XL) 100 MG 24 hr tablet Take 1 tablet (100 mg total) by mouth every evening. (Patient taking differently: Take 100 mg by mouth every evening.) 90 tablet 0   NIACIN PO Take by mouth daily.     VITAMIN D PO Take 1 tablet by mouth daily.     ECHINACEA PO Take 1,520 mg by mouth daily. 760 mg per capsule     ergocalciferol (VITAMIN D2) 1.25 MG (50000 UT) capsule Take 50,000 Units by mouth once a week. Sunday's     NARCAN 4 MG/0.1ML LIQD nasal spray kit Place 1 spray into the nose as needed (accidental overdose.).  (Patient not taking: Reported on 07/15/2020)     No facility-administered medications prior to visit.    PAST MEDICAL HISTORY: Past Medical History:  Diagnosis Date   Anxiety    Blindness of both eyes    due to degenerative retina and acute glaucoma   Complication of anesthesia    pt has limited mouth opening due to bilateral tmj surgery   Fecal incontinence    Fibromyalgia    Generalized seizure disorder [facility])    neurologist-- dr Jannifer Franklin--  seizure is staring off and dropping things  (12-20-2019  per pt last seizure approx. 2005)   GERD (gastroesophageal reflux disease)    H/O Hashimoto thyroiditis    History of acute angle-closure glaucoma    bilateral eyes  s/p eye removal, now has prosthesis   History of hypertension    History of skin cancer    History of thyroid nodule    bx left thyroid nodule 11-17-2017;  s/p left thyroidectomy 11-07-2018, benign mass   Hyperlipidemia    Limitation of opening of mouth     per pt due to bilateral tmj surgery 1989   MDD (major depressive disorder)    Migraines    Mixed connective tissue disease ([place])    rheumotologist-- dr Amil Amen--  taking plaquenil   OA (osteoarthritis)    OSA (obstructive sleep apnea)    does not use CPAP   Osteoporosis    PONV (postoperative nausea and vomiting)    and hard to wake   Prosthetic eye globe    bilateral   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 ;  s/p enucleation     PAST SURGICAL HISTORY: Past Surgical History:  Procedure Laterality Date   ABDOMINAL HYSTERECTOMY  1980s   AND APPENDECTOMY   ENUCLEATION Right 1984  right eye   EVISCERATION Left 1996   left eye   LAPAROSCOPIC CHOLECYSTECTOMY  1993   SHOULDER ARTHROSCOPY WITH SUBACROMIAL DECOMPRESSION Right 09-15-2007  @[facility]    AND BURSECTOMY/  DEBRIDEMENT ROTATOR CUFF   TEMPOROMANDIBULAR JOINT SURGERY Bilateral 1989   THYROIDECTOMY Left 11/07/2018   Procedure: LEFT HEMI THYROIDECTOMY;  Surgeon: 01/07/2019, MD;  Location: [facility];  Service: ENT;  Laterality: Left;   TONSILLECTOMY  child    FAMILY HISTORY: Family History  Problem Relation Age of Onset   Depression Paternal Aunt    Alcohol abuse Paternal Uncle    Drug abuse Paternal Uncle    Depression Cousin    Depression Maternal Aunt    Colon cancer Mother    Colon cancer Maternal Aunt    Colon cancer Other     SOCIAL HISTORY: Social History   Socioeconomic History   Marital status: Married    Spouse name: Not on file   Number of children: 0   Years of education: 18   Highest education level: Not on file  Occupational History   Occupation: Retired  Tobacco Use   Smoking status: Former    Years: 20.00    Pack years: 0.00    Types: Cigarettes    Quit date: 01/03/1992    Years since quitting: 28.5   Smokeless tobacco: Never  Vaping Use   Vaping Use: Never used  Substance and Sexual Activity   Alcohol use: No     Alcohol/week: 0.0 standard drinks   Drug use: Never   Sexual activity: Yes    Birth control/protection: Surgical  Other Topics Concern   Not on file  Social History Narrative   POA-Dennis   Caffeine use: daily (1 soda per day, 1 coffee per day)   Left handed    Legally blind      Worked as LPN/RN for about 8 years   Social Determinants of 14/08/1991 Strain: Not on file  Food Insecurity: Not on file  Transportation Needs: Not on file  Physical Activity: Not on file  Stress: Not on file  Social Connections: Not on file  Intimate Partner Violence: Not on file   PHYSICAL EXAM  Vitals:   07/15/20 1401  BP: 110/81  Pulse: 70  SpO2: 98%  Weight: 165 lb 9.6 oz (75.1 kg)  Height: 5\' 5"  (1.651 m)    Body mass index is 27.56 kg/m.  Generalized: Well developed, in no acute distress  Neurological examination  Mentation: Alert oriented to time, place, history taking. Follows all commands speech and language fluent, affect is flat Cranial nerve II-XII: Facial symmetry is present, speech is normal, she is blind. Motor: Good strength all extremities. No tremor noted. No rigidity noted. Sensory: Sensory testing is intact to soft touch on all 4 extremities. No evidence of extinction is noted.  Coordination: Mild tremor on left when touching nose Gait and station: Gait is normal, but is blind, requires leading, is slow, no shuffling Reflexes: Deep tendon reflexes are symmetric   DIAGNOSTIC DATA (LABS, IMAGING, TESTING) - I reviewed patient records, labs, notes, testing and imaging myself where available.  Lab Results  Component Value Date   WBC 11.8 (H) 11/14/2019   HGB 13.3 11/14/2019   HCT 39.5 11/14/2019   MCV 85 11/14/2019   PLT 307 11/14/2019      Component Value Date/Time   NA 143 11/14/2019 1427   K 4.2 11/14/2019 1427   CL 101 11/14/2019 1427  CO2 28 11/14/2019 1427   GLUCOSE 97 11/14/2019 1427   BUN 10 11/14/2019 1427   CREATININE 0.83  11/14/2019 1427   CALCIUM 9.6 11/14/2019 1427   PROT 6.9 11/14/2019 1427   ALBUMIN 4.4 11/14/2019 1427   AST 17 11/14/2019 1427   ALT 17 11/14/2019 1427   ALKPHOS 121 11/14/2019 1427   BILITOT 0.3 11/14/2019 1427   GFRNONAA 71 11/14/2019 1427   GFRAA 81 11/14/2019 1427   Lab Results  Component Value Date   CHOL 203 (H) 11/18/2017   HDL 51 11/18/2017   LDLCALC 107 (H) 11/18/2017   TRIG 227 (H) 11/18/2017   CHOLHDL 4.0 11/18/2017   No results found for: HGBA1C Lab Results  Component Value Date   MOQHUTML46 503 03/02/2018   Lab Results  Component Value Date   TSH 2.940 03/02/2018    ASSESSMENT AND PLAN 75 y.o. year old female  has a past medical history of Anxiety, Blindness of both eyes, Complication of anesthesia, Fecal incontinence, Fibromyalgia, Generalized seizure disorder ([place]), GERD (gastroesophageal reflux disease), H/O Hashimoto thyroiditis, History of acute angle-closure glaucoma, History of hypertension, History of skin cancer, History of thyroid nodule, Hyperlipidemia, Limitation of opening of mouth, MDD (major depressive disorder), Migraines, Mixed connective tissue disease ([place]), OA (osteoarthritis), OSA (obstructive sleep apnea), Osteoporosis, PONV (postoperative nausea and vomiting), Prosthetic eye globe, and Retinal degenerative disease. here with:  1.  Seizure-like events -No recent seizure events -Continue Carbatrol 200 mg daily -Labs were okay in October, carbamazepine level was 6.1 will update labs at next visit -A lot of change, her husband committed suicide 2 months ago, living alone now, she is blind -Family history of PD, will follow for signs, has flat affect, gait is slow, hard to assess exam given blindness, will follow overtime -Follow-up in 6 months or sooner if needed  I spent 30 minutes of face-to-face and non-face-to-face time with patient.  This included previsit chart review, lab review, study review, order entry, discussing management plan, and  follow-up.  Butler Denmark, AGNP-C, DNP 07/15/2020, 2:16 PM [facility] [address], [place], [place] [address] [phone]

## 2020-07-15 NOTE — Progress Notes (Signed)
I have read the note, and I agree with the clinical assessment and plan.  Christin Moline K Wiatt Mahabir   

## 2020-07-15 NOTE — Patient Instructions (Signed)
Continue current medications Check back in 6 months Call for seizures

## 2020-07-16 ENCOUNTER — Encounter (HOSPITAL_COMMUNITY): Payer: Self-pay | Admitting: Psychiatry

## 2020-07-16 ENCOUNTER — Other Ambulatory Visit: Payer: Self-pay

## 2020-07-16 ENCOUNTER — Telehealth (INDEPENDENT_AMBULATORY_CARE_PROVIDER_SITE_OTHER): Payer: Medicare Other | Admitting: Psychiatry

## 2020-07-16 DIAGNOSIS — F321 Major depressive disorder, single episode, moderate: Secondary | ICD-10-CM | POA: Diagnosis not present

## 2020-07-16 MED ORDER — DULOXETINE HCL 60 MG PO CPEP
60.0000 mg | ORAL_CAPSULE | Freq: Every day | ORAL | 3 refills | Status: DC
Start: 2020-07-16 — End: 2020-07-16

## 2020-07-16 MED ORDER — DULOXETINE HCL 60 MG PO CPEP
60.0000 mg | ORAL_CAPSULE | Freq: Every day | ORAL | 3 refills | Status: DC
Start: 2020-07-16 — End: 2020-08-27

## 2020-07-16 MED ORDER — ALPRAZOLAM 1 MG PO TABS
1.0000 mg | ORAL_TABLET | Freq: Three times a day (TID) | ORAL | 3 refills | Status: DC | PRN
Start: 2020-07-16 — End: 2020-07-16

## 2020-07-16 MED ORDER — ALPRAZOLAM 1 MG PO TABS
1.0000 mg | ORAL_TABLET | Freq: Three times a day (TID) | ORAL | 3 refills | Status: DC | PRN
Start: 2020-07-16 — End: 2020-08-27
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
## 2020-07-25 ENCOUNTER — Ambulatory Visit (INDEPENDENT_AMBULATORY_CARE_PROVIDER_SITE_OTHER): Payer: Medicare Other | Admitting: Psychiatry

## 2020-07-25 ENCOUNTER — Other Ambulatory Visit: Payer: Self-pay

## 2020-07-25 DIAGNOSIS — F321 Major depressive disorder, single episode, moderate: Secondary | ICD-10-CM
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
## 2020-08-08 ENCOUNTER — Ambulatory Visit (INDEPENDENT_AMBULATORY_CARE_PROVIDER_SITE_OTHER): Payer: Medicare Other | Admitting: Psychiatry

## 2020-08-08 ENCOUNTER — Other Ambulatory Visit: Payer: Self-pay

## 2020-08-08 DIAGNOSIS — F321 Major depressive disorder, single episode, moderate: Secondary | ICD-10-CM
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
## 2020-08-22 ENCOUNTER — Ambulatory Visit (INDEPENDENT_AMBULATORY_CARE_PROVIDER_SITE_OTHER): Payer: Medicare Other | Admitting: Psychiatry

## 2020-08-22 ENCOUNTER — Other Ambulatory Visit: Payer: Self-pay

## 2020-08-22 DIAGNOSIS — F321 Major depressive disorder, single episode, moderate: Secondary | ICD-10-CM
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
## 2020-08-27 ENCOUNTER — Other Ambulatory Visit: Payer: Self-pay

## 2020-08-27 ENCOUNTER — Encounter (HOSPITAL_COMMUNITY): Payer: Self-pay | Admitting: Psychiatry

## 2020-08-27 ENCOUNTER — Telehealth (INDEPENDENT_AMBULATORY_CARE_PROVIDER_SITE_OTHER): Payer: Medicare Other | Admitting: Psychiatry

## 2020-08-27 DIAGNOSIS — F321 Major depressive disorder, single episode, moderate: Secondary | ICD-10-CM | POA: Diagnosis not present

## 2020-08-27 MED ORDER — ALPRAZOLAM 1 MG PO TABS: 1.0000 mg | ORAL_TABLET | Freq: Three times a day (TID) | ORAL | 3 refills | Status: DC | PRN

## 2020-08-27 MED ORDER — DULOXETINE HCL 60 MG PO CPEP
60.0000 mg | ORAL_CAPSULE | Freq: Two times a day (BID) | ORAL | 3 refills | Status: DC
Start: 2020-08-27 — End: 2020-11-27
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
## 2020-09-03 ENCOUNTER — Other Ambulatory Visit: Payer: Self-pay

## 2020-09-03 ENCOUNTER — Ambulatory Visit (INDEPENDENT_AMBULATORY_CARE_PROVIDER_SITE_OTHER): Payer: Medicare Other | Admitting: Psychiatry

## 2020-09-03 DIAGNOSIS — F321 Major depressive disorder, single episode, moderate: Secondary | ICD-10-CM
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
## 2020-09-17 ENCOUNTER — Ambulatory Visit (INDEPENDENT_AMBULATORY_CARE_PROVIDER_SITE_OTHER): Payer: Medicare Other | Admitting: Psychiatry

## 2020-09-17 ENCOUNTER — Other Ambulatory Visit: Payer: Self-pay

## 2020-09-17 DIAGNOSIS — F321 Major depressive disorder, single episode, moderate: Secondary | ICD-10-CM | POA: Diagnosis not present
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
## 2020-10-01 ENCOUNTER — Ambulatory Visit (HOSPITAL_COMMUNITY): Payer: Medicare Other | Admitting: Psychiatry

## 2020-10-15 ENCOUNTER — Ambulatory Visit (INDEPENDENT_AMBULATORY_CARE_PROVIDER_SITE_OTHER): Payer: Medicare Other | Admitting: Psychiatry

## 2020-10-15 ENCOUNTER — Other Ambulatory Visit: Payer: Self-pay

## 2020-10-15 DIAGNOSIS — F321 Major depressive disorder, single episode, moderate: Secondary | ICD-10-CM
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
## 2020-10-29 ENCOUNTER — Ambulatory Visit (INDEPENDENT_AMBULATORY_CARE_PROVIDER_SITE_OTHER): Payer: Medicare Other | Admitting: Psychiatry

## 2020-10-29 ENCOUNTER — Other Ambulatory Visit: Payer: Self-pay

## 2020-10-29 DIAGNOSIS — F321 Major depressive disorder, single episode, moderate: Secondary | ICD-10-CM | POA: Diagnosis not present
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
## 2020-11-12 ENCOUNTER — Ambulatory Visit (INDEPENDENT_AMBULATORY_CARE_PROVIDER_SITE_OTHER): Payer: Medicare Other | Admitting: Psychiatry

## 2020-11-12 ENCOUNTER — Other Ambulatory Visit: Payer: Self-pay

## 2020-11-12 DIAGNOSIS — F321 Major depressive disorder, single episode, moderate: Secondary | ICD-10-CM | POA: Diagnosis not present
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
## 2020-11-25 ENCOUNTER — Other Ambulatory Visit (HOSPITAL_COMMUNITY): Payer: Self-pay | Admitting: Family Medicine

## 2020-11-25 DIAGNOSIS — Z1231 Encounter for screening mammogram for malignant neoplasm of breast: Secondary | ICD-10-CM

## 2020-11-27 ENCOUNTER — Other Ambulatory Visit: Payer: Self-pay

## 2020-11-27 ENCOUNTER — Encounter (HOSPITAL_COMMUNITY): Payer: Self-pay | Admitting: Psychiatry

## 2020-11-27 ENCOUNTER — Telehealth (INDEPENDENT_AMBULATORY_CARE_PROVIDER_SITE_OTHER): Payer: Medicare Other | Admitting: Psychiatry

## 2020-11-27 DIAGNOSIS — F321 Major depressive disorder, single episode, moderate: Secondary | ICD-10-CM | POA: Diagnosis not present

## 2020-11-27 MED ORDER — ALPRAZOLAM 1 MG PO TABS
1.0000 mg | ORAL_TABLET | Freq: Three times a day (TID) | ORAL | 3 refills | Status: DC | PRN
Start: 2020-11-27 — End: 2021-01-07

## 2020-11-27 MED ORDER — PRIMIDONE 50 MG PO TABS: 50.0000 mg | ORAL_TABLET | Freq: Every day | ORAL | 2 refills | Status: DC

## 2020-11-27 MED ORDER — DULOXETINE HCL 60 MG PO CPEP
60.0000 mg | ORAL_CAPSULE | Freq: Two times a day (BID) | ORAL | 3 refills | Status: DC
Start: 2020-11-27 — End: 2021-01-07
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
## 2020-11-28 ENCOUNTER — Ambulatory Visit (INDEPENDENT_AMBULATORY_CARE_PROVIDER_SITE_OTHER): Payer: Medicare Other | Admitting: Psychiatry

## 2020-11-28 ENCOUNTER — Other Ambulatory Visit: Payer: Self-pay

## 2020-11-28 DIAGNOSIS — F321 Major depressive disorder, single episode, moderate: Secondary | ICD-10-CM | POA: Diagnosis not present
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
## 2020-12-04 ENCOUNTER — Ambulatory Visit (HOSPITAL_COMMUNITY)
Admission: RE | Admit: 2020-12-04 | Discharge: 2020-12-04 | Disposition: A | Discharge status: Home / Self Care | Payer: Medicare Other | Source: Ambulatory Visit | Attending: Family Medicine | Admitting: Family Medicine

## 2020-12-04 ENCOUNTER — Other Ambulatory Visit: Payer: Self-pay

## 2020-12-04 DIAGNOSIS — Z1231 Encounter for screening mammogram for malignant neoplasm of breast: Secondary | ICD-10-CM | POA: Insufficient documentation

## 2020-12-11 ENCOUNTER — Ambulatory Visit (HOSPITAL_COMMUNITY): Payer: Medicare Other

## 2020-12-12 ENCOUNTER — Other Ambulatory Visit: Payer: Self-pay

## 2020-12-12 ENCOUNTER — Ambulatory Visit (INDEPENDENT_AMBULATORY_CARE_PROVIDER_SITE_OTHER): Payer: Medicare Other | Admitting: Psychiatry

## 2020-12-12 DIAGNOSIS — F321 Major depressive disorder, single episode, moderate: Secondary | ICD-10-CM | POA: Diagnosis not present
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
## 2020-12-16 ENCOUNTER — Ambulatory Visit (HOSPITAL_COMMUNITY)
Admission: RE | Admit: 2020-12-16 | Discharge: 2020-12-16 | Disposition: A | Discharge status: Home / Self Care | Payer: Medicare Other | Source: Ambulatory Visit | Attending: Family Medicine | Admitting: Family Medicine

## 2020-12-16 ENCOUNTER — Other Ambulatory Visit: Payer: Self-pay

## 2020-12-16 DIAGNOSIS — Z1231 Encounter for screening mammogram for malignant neoplasm of breast: Secondary | ICD-10-CM | POA: Insufficient documentation

## 2020-12-26 ENCOUNTER — Ambulatory Visit (INDEPENDENT_AMBULATORY_CARE_PROVIDER_SITE_OTHER): Payer: Medicare Other | Admitting: Psychiatry

## 2020-12-26 ENCOUNTER — Other Ambulatory Visit: Payer: Self-pay

## 2020-12-26 DIAGNOSIS — F321 Major depressive disorder, single episode, moderate: Secondary | ICD-10-CM | POA: Diagnosis not present
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
## 2021-01-07 ENCOUNTER — Other Ambulatory Visit: Payer: Self-pay

## 2021-01-07 ENCOUNTER — Ambulatory Visit (INDEPENDENT_AMBULATORY_CARE_PROVIDER_SITE_OTHER): Payer: Medicare Other | Admitting: Psychiatry

## 2021-01-07 ENCOUNTER — Encounter (HOSPITAL_COMMUNITY): Payer: Self-pay | Admitting: Psychiatry

## 2021-01-07 ENCOUNTER — Telehealth (INDEPENDENT_AMBULATORY_CARE_PROVIDER_SITE_OTHER): Payer: Medicare Other | Admitting: Psychiatry

## 2021-01-07 DIAGNOSIS — F321 Major depressive disorder, single episode, moderate: Secondary | ICD-10-CM | POA: Diagnosis not present

## 2021-01-07 MED ORDER — PRIMIDONE 50 MG PO TABS
50.0000 mg | ORAL_TABLET | Freq: Every day | ORAL | 2 refills | Status: DC
Start: 2021-01-07 — End: 2021-01-30

## 2021-01-07 MED ORDER — ALPRAZOLAM 1 MG PO TABS
1.0000 mg | ORAL_TABLET | Freq: Three times a day (TID) | ORAL | 3 refills | Status: DC | PRN
Start: 2021-01-07 — End: 2021-01-30

## 2021-01-07 MED ORDER — TRAZODONE HCL 100 MG PO TABS: 100.0000 mg | ORAL_TABLET | Freq: Every day | ORAL | 2 refills | Status: DC

## 2021-01-07 MED ORDER — DULOXETINE HCL 60 MG PO CPEP
60.0000 mg | ORAL_CAPSULE | Freq: Two times a day (BID) | ORAL | 3 refills | Status: DC
Start: 2021-01-07 — End: 2021-01-30
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
## 2021-01-13 ENCOUNTER — Telehealth (HOSPITAL_COMMUNITY): Payer: Self-pay | Admitting: Psychiatry

## 2021-01-13 ENCOUNTER — Telehealth (HOSPITAL_COMMUNITY): Payer: Self-pay | Admitting: *Deleted

## 2021-01-13 NOTE — Telephone Encounter (Signed)
[facility] called [facility] stating she would like to update provider on her Trazodone.   Per pt on Dec 13t h she took 1/2 tablet of the Trazodone 100mg  and it did nothing. Per pt she then too the other 1/2 tablet about 2 hours later and it did nothing.   Per pt she will try again on Dec 14 th and she will be taking the tire tablet and hopefully this works and hopefully she's able to sleep.   Message will be sent to provider

## 2021-01-13 NOTE — Telephone Encounter (Signed)
Please ask her if she wants to try something else for sleep, thanks

## 2021-01-13 NOTE — Telephone Encounter (Signed)
okay

## 2021-01-13 NOTE — Telephone Encounter (Signed)
Patient called and [facility] stating she would like to schedule f/u for provider.   Staff called patient and was not able to reach her and [facility] for patient to call office back to resch appt.
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
## 2021-01-13 NOTE — Telephone Encounter (Signed)
Therapist returned call to patient regarding patient's reaction to triggers this past weekend.  Encouraged patient to practice relaxation techniques 5 to 10 minutes twice per day.  Therapist also discussed possible next steps for treatment to be discussed more in detail at next session.  Therapist reiterated rationale for practicing relaxation techniques regularly in preparation for work on trauma history and exposure therapy.

## 2021-01-15 NOTE — Progress Notes (Signed)
PATIENT: Erica Bennett DOB: 19-Nov-1945  REASON FOR VISIT: follow up for history of seizures HISTORY FROM: patient Primary Neurologist: Dr. Rexene Alberts since Dr. Jannifer Franklin retired   HISTORY OF PRESENT ILLNESS: Today 01/16/21 Erica Bennett is here today for follow-up with history of seizure-like events.  Remains on brand-name Carbatrol.  She is blind. Living alone since her husband passed earlier this year. Seeing psychiatry dealing with depression, anxiety following her husbands suicide. Is on Xanax for anxiety, Cymbalta for depression and fibromyalgia, and Primidone for tremor, trazodone for sleep. Episodes of loud noise trigger her to remind her of when her husband shot himself. Concerns about PD, for the last year tremors in the morning in hands. Since husbands passing, has traveled to her legs. PD runs in her family on mother's side (her mother, 2 uncles, grandfather). Does feel her memory isn't as good, repeating herself. Tremor is both hands, worse in the left, notices most with action prior to her husbands shooting, but after is at rest. No freezing episodes but feels stiff, sometimes has to rock out of chair few times. No smell or taste for the last 5 years. Not sleeping well, stays awake 3-4 am, no naps during the day. Primidone has been helpful at night for tremor, but is also taking Xanax 3 times daily, medications usually wear off at 3 PM, tremor is back. Has not fallen. Friend from church drove here today.   Update 07/15/2020 SS: Erica Bennett is a 75 year old female with history of seizure-like events. She is blind, has artifical eyes.  Remains on brand-name Carbatrol 200 mg twice daily.  No seizures since last seen.  Carbamazepine level was therapeutic in October 2021 at 6.1, CMP was unremarkable. Her husband committed suicide back in April. She is now living alone, seems to be doing okay adjusting to this. She has 2 friends who help her, friends from church drive her to appointments. She has no  relatives left or children. 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 Seizures well controlled, reports 1 seizure in 17 years. Sees PCP. Getting social service consult, came out and labeled things (air fryer).  Worried, may be developing PD, intermittent tremor, both hands, at rest or action. Balance isn't great, hard to tell since blind, no sense of smell for years. Strong family history of PD.  Update 11/14/2019 SS: Erica Bennett is a 75 year old female with history of seizure-like events, mixed connective tissue disease taking Plaquenil.  MRI of the brain has been unremarkable.  In the past her seizure events have been described as staring off, she will drop things.  In February 2020, found to have low sodium, dose of Carbatrol was decreased from 200 mg twice a day, to 200 mg twice a day.  Since last seen, she had one "funny" sensation of near seizure, brought on by paper movement from a magazine in a doctor's office waiting room.  No seizure actually occurred.  She has fibromyalgia, is now on Cymbalta.  She is blind, has artificial eyes.  She requires brand-name Carbatrol, had breakthrough seizures on generic.  In 17 years, her husband has only seen her have 1 seizure.  Has had some rectal sphincter issues, having an implant.  Presents today for evaluation accompanied by her husband.   HISTORY 03/14/2019 SS: Erica Bennett is a 75 year old female with history of seizure-like events, mixed connective tissue disease taking Plaquenil for this.  MRI of the brain has been unremarkable.  In the past, her seizure events have been described  as staring off, she would drop things.  Her last seizure occurred 16 years ago.  She says in the interim she has been diagnosed with fibromyalgia.  She is blind, has artificial eyes. She has remained on lower dose of carbamazepine, as result of low sodium level.  She has done well with this.  She does require brand-name Carbatrol, when on generic, says she has breakthrough  seizures.  She presents today for evaluation accompanied by her husband.    REVIEW OF SYSTEMS: Out of a complete 14 system review of symptoms, the patient complains only of the following symptoms, and all other reviewed systems are negative.  See HPI  ALLERGIES: Allergies  Allergen Reactions   Bacitracin Rash   Neomycin Rash   Sulfa Antibiotics Swelling and Rash   Sulfamethoxazole Swelling and Other (See Comments)   Eggs Or Egg-Derived Products Diarrhea and Nausea And Vomiting    Stomach cramps   Elavil [Amitriptyline Hcl] Other (See Comments)    Really bad tremors   Gabapentin Other (See Comments)    Balance issues   Mixed Ragweed     Nasal congestion   Prozac [Fluoxetine Hcl]     Severe headache    HOME MEDICATIONS: Outpatient Medications Prior to Visit  Medication Sig Dispense Refill   ALPRAZolam (XANAX) 1 MG tablet Take 1 tablet (1 mg total) by mouth 3 (three) times daily as needed for anxiety. 90 tablet 3   CARBATROL 200 MG 12 hr capsule TAKE ONE CAPSULE BY MOUTH DAILY. 90 capsule 3   DEXILANT 60 MG capsule TAKE ONE CAPSULE BY MOUTH DAILY; ON AN EMPTY STOMACH, THEN DO NOT EAT FOR ONE HOUR. (Patient taking differently: Take 60 mg by mouth daily before breakfast.) 30 capsule 5   DULoxetine (CYMBALTA) 60 MG capsule Take 1 capsule (60 mg total) by mouth 2 (two) times daily. 180 capsule 3   HYDROcodone-acetaminophen (NORCO/VICODIN) 5-325 MG tablet Take 1 tablet by mouth every 6 (six) hours as needed for moderate pain.     hydroxychloroquine (PLAQUENIL) 200 MG tablet Take 1 tablet (200 mg total) by mouth 2 (two) times daily. (Patient taking differently: Take 200 mg by mouth 2 (two) times daily.) 180 tablet 1   MAXALT 10 MG tablet Take 1 tablet (10 mg total) by mouth as needed. (Patient taking differently: Take 10 mg by mouth 3 (three) times daily as needed for migraine.) 10 tablet 5   metoprolol succinate (TOPROL-XL) 100 MG 24 hr tablet Take 1 tablet (100 mg total) by mouth every  evening. (Patient taking differently: Take 100 mg by mouth every evening.) 90 tablet 0   NARCAN 4 MG/0.1ML LIQD nasal spray kit Place 1 spray into the nose as needed (accidental overdose.).     NIACIN PO Take by mouth daily.     primidone (MYSOLINE) 50 MG tablet Take 1 tablet (50 mg total) by mouth at bedtime. 30 tablet 2   traZODone (DESYREL) 100 MG tablet Take 1 tablet (100 mg total) by mouth at bedtime. 30 tablet 2   VITAMIN D PO Take 1 tablet by mouth daily.     No facility-administered medications prior to visit.    PAST MEDICAL HISTORY: Past Medical History:  Diagnosis Date   Anxiety    Blindness of both eyes    due to degenerative retina and acute glaucoma   Complication of anesthesia    pt has limited mouth opening due to bilateral tmj surgery   Fecal incontinence    Fibromyalgia  Generalized seizure disorder [facility])    neurologist-- dr Jannifer Franklin--  seizure is staring off and dropping things  (12-20-2019  per pt last seizure approx. 2005)   GERD (gastroesophageal reflux disease)    H/O Hashimoto thyroiditis    History of acute angle-closure glaucoma    bilateral eyes  s/p eye removal, now has prosthesis   History of hypertension    History of skin cancer    History of thyroid nodule    bx left thyroid nodule 11-17-2017;  s/p left thyroidectomy 11-07-2018, benign mass   Hyperlipidemia    Limitation of opening of mouth    per pt due to bilateral tmj surgery 1989   MDD (major depressive disorder)    Migraines    Mixed connective tissue disease ([place])    rheumotologist-- dr Amil Amen--  taking plaquenil   OA (osteoarthritis)    OSA (obstructive sleep apnea)    does not use CPAP   Osteoporosis    PONV (postoperative nausea and vomiting)    and hard to wake   Prosthetic eye globe    bilateral   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 ;  s/p enucleation     PAST SURGICAL HISTORY: Past Surgical History:  Procedure  Laterality Date   ABDOMINAL HYSTERECTOMY  1980s   AND APPENDECTOMY   ENUCLEATION Right 1984   right eye   EVISCERATION Left 1996   left eye   [place] ARTHROSCOPY WITH SUBACROMIAL DECOMPRESSION Right 09-15-2007  _0    AND BURSECTOMY/  DEBRIDEMENT ROTATOR CUFF   TEMPOROMANDIBULAR JOINT SURGERY Bilateral 1989   THYROIDECTOMY Left 11/07/2018   Procedure: LEFT HEMI THYROIDECTOMY;  Surgeon: Leta Baptist, MD;  Location: [facility];  Service: ENT;  Laterality: Left;   TONSILLECTOMY  child    FAMILY HISTORY: Family History  Problem Relation Age of Onset   Colon cancer Mother    Parkinson's disease Mother    Depression Maternal Aunt    Colon cancer Maternal Aunt    Depression Paternal Aunt    Alcohol abuse Paternal Uncle    Drug abuse Paternal Uncle    Parkinson's disease Maternal Grandfather    Depression Cousin    Colon cancer Other     SOCIAL HISTORY: Social History   Socioeconomic History   Marital status: Married    Spouse name: Not on file   Number of children: 0   Years of education: 18   Highest education level: Not on file  Occupational History   Occupation: Retired  Tobacco Use   Smoking status: Former    Years: 20.00    Types: Cigarettes    Quit date: 01/03/1992    Years since quitting: 29.0   Smokeless tobacco: Never  Vaping Use   Vaping Use: Never used  Substance and Sexual Activity   Alcohol use: No    Alcohol/week: 0.0 standard drinks   Drug use: Never   Sexual activity: Yes    Birth control/protection: Surgical  Other Topics Concern   Not on file  Social History Narrative   POA-[place]   Caffeine use: daily (1 soda per day, 1 coffee per day)   Left handed    Legally blind      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 broadcast assistant Strain: Not on file  Food Insecurity: Not on file  Transportation Needs: Not on file  Physical Activity: Not on file  Stress: Not  on file  Social Connections: Not on file  Intimate Partner Violence: Not on file   PHYSICAL EXAM  Vitals:   01/16/21 1341  BP: 119/70  Pulse: (!) 59  Weight: 159 lb (72.1 kg)  Height: _0  (1.651 m)   Body mass index is 26.46 kg/m. Generalized: Well developed, in no acute distress, flat affect, masking of the face Neurological examination  Mentation: Alert oriented to time, place, history taking. Follows all commands speech and language fluent Cranial nerve II-XII: Facial symmetry is present, speech is normal, she is blind, has artifical eyes  Motor: Good strength all extremities. Mild left tremor with hands outstretched, do not note resting tremor sitting in chair, mild decrease hand taps Sensory: Sensory testing is intact to soft touch on all 4 extremities. No evidence of extinction is noted.  Coordination: Is blind, difficulty performing Gait and station: Gait is normal, but is blind, requires leading, is slow, with turns tends to lean back, is cautious Reflexes: Deep tendon reflexes are symmetric   DIAGNOSTIC DATA (LABS, IMAGING, TESTING) - I reviewed patient records, labs, notes, testing and imaging myself where available.  Lab Results  Component Value Date   WBC 11.8 (H) 11/14/2019   HGB 13.3 11/14/2019   HCT 39.5 11/14/2019   MCV 85 11/14/2019   PLT 307 11/14/2019      Component Value Date/Time   NA 143 11/14/2019 1427   K 4.2 11/14/2019 1427   CL 101 11/14/2019 1427   CO2 28 11/14/2019 1427   GLUCOSE 97 11/14/2019 1427   BUN 10 11/14/2019 1427   CREATININE 0.83 11/14/2019 1427   CALCIUM 9.6 11/14/2019 1427   PROT 6.9 11/14/2019 1427   ALBUMIN 4.4 11/14/2019 1427   AST 17 11/14/2019 1427   ALT 17 11/14/2019 1427   ALKPHOS 121 11/14/2019 1427   BILITOT 0.3 11/14/2019 1427   GFRNONAA 71 11/14/2019 1427   GFRAA 81 11/14/2019 1427   Lab Results  Component Value Date   CHOL 203 (H) 11/18/2017   HDL 51 11/18/2017   LDLCALC 107 (H) 11/18/2017   TRIG 227  (H) 11/18/2017   CHOLHDL 4.0 11/18/2017   No results found for: HGBA1C Lab Results  Component Value Date   VITAMINB12 542 03/02/2018   Lab Results  Component Value Date   TSH 2.940 03/02/2018    ASSESSMENT AND PLAN 75 y.o. year old female  has a past medical history of Anxiety, Blindness of both eyes, Complication of anesthesia, Fecal incontinence, Fibromyalgia, Generalized seizure disorder ([place]), GERD (gastroesophageal reflux disease), H/O Hashimoto thyroiditis, History of acute angle-closure glaucoma, History of hypertension, History of skin cancer, History of thyroid nodule, Hyperlipidemia, Limitation of opening of mouth, MDD (major depressive disorder), Migraines, Mixed connective tissue disease ([place]), OA (osteoarthritis), OSA (obstructive sleep apnea), Osteoporosis, PONV (postoperative nausea and vomiting), Prosthetic eye globe, and Retinal degenerative disease. here with:  1.  Seizure-like events -No recent seizure spells -Continue Carbatrol 200 mg daily -Check routine labs today  2.  Reported tremor, gait instability, loss of smell -Concern for Parkinson's disease, strong family history -Some symptoms present such as loss of smell for the last 5 years, tremor for 1 year, everything intensified after the traumatic passing of her husband earlier this year;  she is blind  -Now seeing psychiatry, on Xanax, Cymbalta, trazodone, primidone (for tremor) -Will get her into see one of her movement specialist for evaluation of possible PD (on primidone now for tremor)  Butler Denmark, AGNP-C, DNP 01/16/2021, 1:58 PM [facility] [address] [place],  [address] [phone]

## 2021-01-16 ENCOUNTER — Ambulatory Visit (INDEPENDENT_AMBULATORY_CARE_PROVIDER_SITE_OTHER): Payer: Medicare Other | Admitting: Neurology

## 2021-01-16 ENCOUNTER — Encounter: Payer: Self-pay | Admitting: Neurology

## 2021-01-16 VITALS — BP 119/70 | HR 59 | Ht 65.0 in | Wt 159.0 lb

## 2021-01-16 DIAGNOSIS — G40409 Other generalized epilepsy and epileptic syndromes, not intractable, without status epilepticus: Secondary | ICD-10-CM

## 2021-01-16 DIAGNOSIS — R569 Unspecified convulsions: Secondary | ICD-10-CM | POA: Diagnosis not present

## 2021-01-16 DIAGNOSIS — R251 Tremor, unspecified: Secondary | ICD-10-CM | POA: Diagnosis not present

## 2021-01-16 MED ORDER — CARBATROL 200 MG PO CP12: 200.0000 mg | ORAL_CAPSULE | Freq: Every day | ORAL | 3 refills | Status: DC

## 2021-01-16 NOTE — Patient Instructions (Signed)
-  Great to see you today -Continue the current medications -Will get you in to see a neurologist for evaluation of tremor/gait issues

## 2021-01-17 LAB — COMPREHENSIVE METABOLIC PANEL
ALT: 21 IU/L (ref 0–32)
AST: 16 IU/L (ref 0–40)
Albumin/Globulin Ratio: 2.3 — ABNORMAL HIGH (ref 1.2–2.2)
Albumin: 4.4 g/dL (ref 3.7–4.7)
Alkaline Phosphatase: 53 IU/L (ref 44–121)
BUN/Creatinine Ratio: 19 (ref 12–28)
BUN: 13 mg/dL (ref 8–27)
Bilirubin Total: 0.2 mg/dL (ref 0.0–1.2)
CO2: 28 mmol/L (ref 20–29)
Calcium: 9 mg/dL (ref 8.7–10.3)
Chloride: 101 mmol/L (ref 96–106)
Creatinine, Ser: 0.7 mg/dL (ref 0.57–1.00)
Globulin, Total: 1.9 g/dL (ref 1.5–4.5)
Glucose: 93 mg/dL (ref 70–99)
Potassium: 4.3 mmol/L (ref 3.5–5.2)
Sodium: 141 mmol/L (ref 134–144)
Total Protein: 6.3 g/dL (ref 6.0–8.5)
eGFR: 91 mL/min/{1.73_m2} (ref >59)

## 2021-01-17 LAB — CBC WITH DIFFERENTIAL/PLATELET
Basophils Absolute: 0 10*3/uL (ref 0.0–0.2)
Basos: 0 %
EOS (ABSOLUTE): 0 10*3/uL (ref 0.0–0.4)
Eos: 1 %
Hematocrit: 36 % (ref 34.0–46.6)
Hemoglobin: 11.9 g/dL (ref 11.1–15.9)
Immature Grans (Abs): 0 10*3/uL (ref 0.0–0.1)
Immature Granulocytes: 0 %
Lymphocytes Absolute: 1.5 10*3/uL (ref 0.7–3.1)
Lymphs: 19 %
MCH: 28.6 pg (ref 26.6–33.0)
MCHC: 33.1 g/dL (ref 31.5–35.7)
MCV: 87 fL (ref 79–97)
Monocytes Absolute: 0.5 10*3/uL (ref 0.1–0.9)
Monocytes: 7 %
Neutrophils Absolute: 5.5 10*3/uL (ref 1.4–7.0)
Neutrophils: 73 %
Platelets: 225 10*3/uL (ref 150–450)
RBC: 4.16 x10E6/uL (ref 3.77–5.28)
RDW: 12.1 % (ref 11.7–15.4)
WBC: 7.5 10*3/uL (ref 3.4–10.8)

## 2021-01-17 LAB — CARBAMAZEPINE LEVEL, TOTAL: Carbamazepine (Tegretol), S: 4 ug/mL (ref 4.0–12.0)

## 2021-01-21 ENCOUNTER — Telehealth: Payer: Self-pay | Admitting: *Deleted

## 2021-01-21 NOTE — Telephone Encounter (Signed)
I spoke to the patient and she is aware of the lab results.

## 2021-01-21 NOTE — Telephone Encounter (Signed)
-----   Message from Suzzanne Cloud, NP sent at 01/21/2021  5:53 AM EST ----- Please call the patient. Blood work is unremarkable. Will be seen in this office for new problem, tremor.

## 2021-01-30 ENCOUNTER — Other Ambulatory Visit: Payer: Self-pay

## 2021-01-30 ENCOUNTER — Encounter (HOSPITAL_COMMUNITY): Payer: Self-pay | Admitting: Psychiatry

## 2021-01-30 ENCOUNTER — Telehealth (INDEPENDENT_AMBULATORY_CARE_PROVIDER_SITE_OTHER): Payer: Medicare Other | Admitting: Psychiatry

## 2021-01-30 DIAGNOSIS — F321 Major depressive disorder, single episode, moderate: Secondary | ICD-10-CM

## 2021-01-30 MED ORDER — DULOXETINE HCL 60 MG PO CPEP: 60.0000 mg | ORAL_CAPSULE | Freq: Two times a day (BID) | ORAL | 3 refills | Status: DC

## 2021-01-30 MED ORDER — ALPRAZOLAM 1 MG PO TABS: 1.0000 mg | ORAL_TABLET | Freq: Three times a day (TID) | ORAL | 3 refills | Status: DC | PRN

## 2021-01-30 MED ORDER — TRAZODONE HCL 100 MG PO TABS: 100.0000 mg | ORAL_TABLET | Freq: Every day | ORAL | 2 refills | Status: DC

## 2021-01-30 MED ORDER — PRIMIDONE 50 MG PO TABS: 50.0000 mg | ORAL_TABLET | Freq: Every day | ORAL | 2 refills | Status: DC
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
## 2021-01-31 ENCOUNTER — Ambulatory Visit (INDEPENDENT_AMBULATORY_CARE_PROVIDER_SITE_OTHER): Payer: Medicare Other | Admitting: Psychiatry

## 2021-01-31 ENCOUNTER — Telehealth (HOSPITAL_COMMUNITY): Payer: Self-pay | Admitting: Psychiatry

## 2021-01-31 ENCOUNTER — Other Ambulatory Visit: Payer: Self-pay

## 2021-01-31 DIAGNOSIS — F321 Major depressive disorder, single episode, moderate: Secondary | ICD-10-CM
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
## 2021-01-31 NOTE — Telephone Encounter (Signed)
Opened in error

## 2021-02-14 ENCOUNTER — Ambulatory Visit (HOSPITAL_COMMUNITY): Payer: Medicare Other | Admitting: Psychiatry

## 2021-02-21 ENCOUNTER — Ambulatory Visit (INDEPENDENT_AMBULATORY_CARE_PROVIDER_SITE_OTHER): Payer: Medicare Other | Admitting: Psychiatry

## 2021-02-21 ENCOUNTER — Other Ambulatory Visit: Payer: Self-pay

## 2021-02-21 DIAGNOSIS — F321 Major depressive disorder, single episode, moderate: Secondary | ICD-10-CM | POA: Diagnosis not present
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
## 2021-03-14 ENCOUNTER — Ambulatory Visit (INDEPENDENT_AMBULATORY_CARE_PROVIDER_SITE_OTHER): Payer: Medicare Other | Admitting: Psychiatry

## 2021-03-14 ENCOUNTER — Other Ambulatory Visit: Payer: Self-pay

## 2021-03-14 DIAGNOSIS — F321 Major depressive disorder, single episode, moderate: Secondary | ICD-10-CM
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
## 2021-03-21 IMAGING — RF DG PELVIS 1-2V
1 series · 2 of 2 positions shown · non-contrast
Comparison: None.

CLINICAL DATA: Sacral nerve stimulator placement

EXAM:
PELVIS - 1-2 VIEW

[Series 1: run · 2 of 2 slices shown]
[im 1/2]
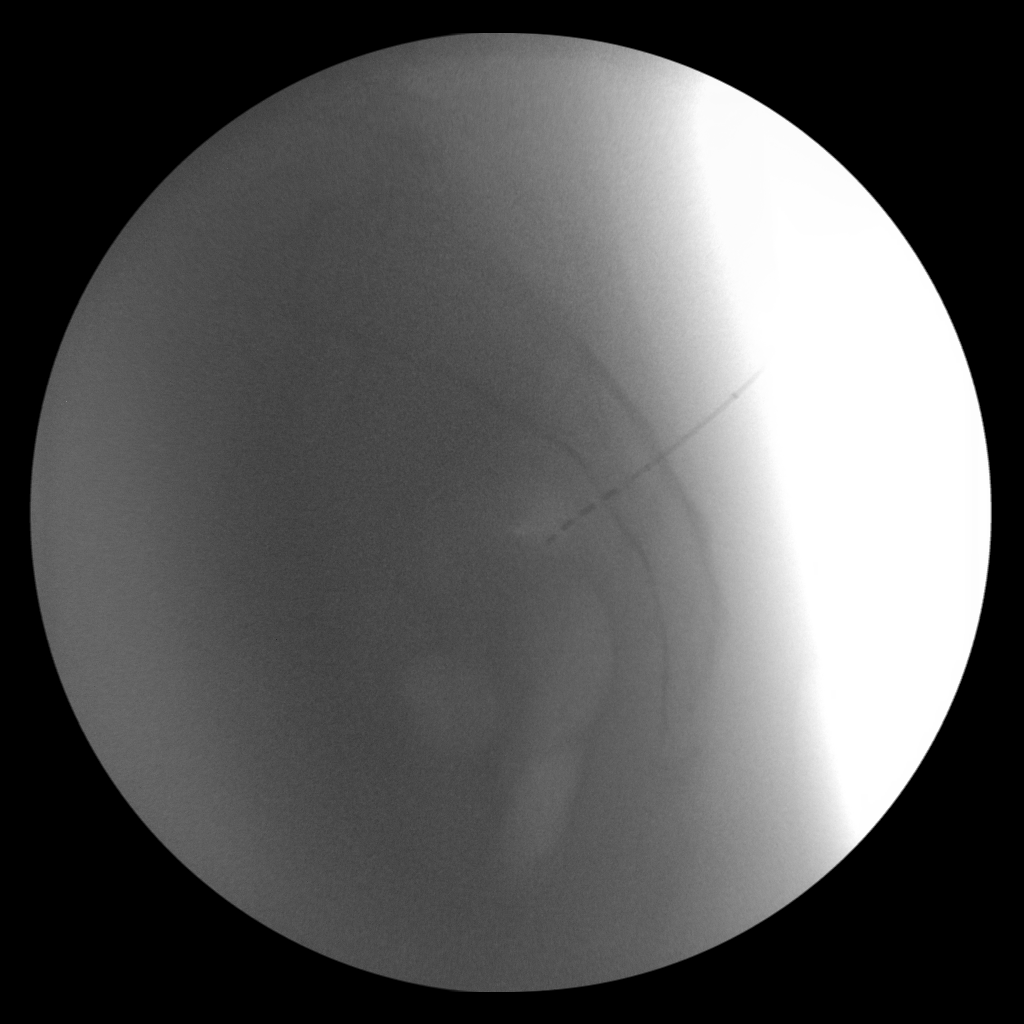
[im 2/2]
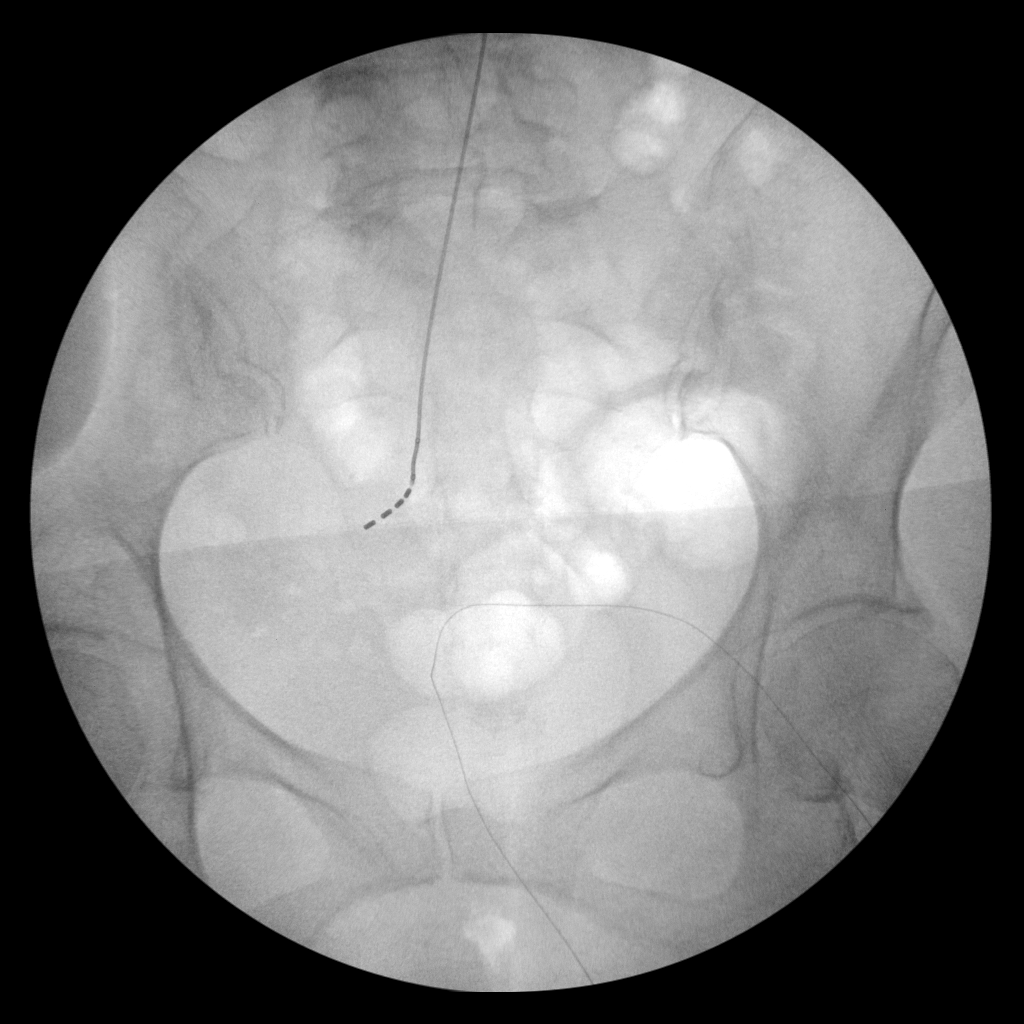

[2 of 2 positions shown; findings below may reference images not displayed]

FINDINGS: Two view intraoperative fluoroscopic radiographs of the sacrum
demonstrate placement of a single electrode in the expected region
of the right sacral plexus

FLUOROSCOPY TIME:  25 seconds
IMPRESSION: Intraoperative radiographs as described above.

## 2021-03-24 ENCOUNTER — Encounter: Payer: Self-pay | Admitting: Neurology

## 2021-03-24 ENCOUNTER — Ambulatory Visit (INDEPENDENT_AMBULATORY_CARE_PROVIDER_SITE_OTHER): Payer: Medicare Other | Admitting: Neurology

## 2021-03-24 VITALS — BP 134/81 | HR 58 | Ht 65.0 in | Wt 150.5 lb

## 2021-03-24 DIAGNOSIS — M359 Systemic involvement of connective tissue, unspecified: Secondary | ICD-10-CM | POA: Insufficient documentation

## 2021-03-24 DIAGNOSIS — M810 Age-related osteoporosis without current pathological fracture: Secondary | ICD-10-CM | POA: Insufficient documentation

## 2021-03-24 DIAGNOSIS — R2689 Other abnormalities of gait and mobility: Secondary | ICD-10-CM | POA: Insufficient documentation

## 2021-03-24 DIAGNOSIS — E063 Autoimmune thyroiditis: Secondary | ICD-10-CM | POA: Insufficient documentation

## 2021-03-24 DIAGNOSIS — E041 Nontoxic single thyroid nodule: Secondary | ICD-10-CM | POA: Insufficient documentation

## 2021-03-24 DIAGNOSIS — G40209 Localization-related (focal) (partial) symptomatic epilepsy and epileptic syndromes with complex partial seizures, not intractable, without status epilepticus: Secondary | ICD-10-CM | POA: Diagnosis not present

## 2021-03-24 DIAGNOSIS — R251 Tremor, unspecified: Secondary | ICD-10-CM | POA: Diagnosis not present

## 2021-03-24 NOTE — Progress Notes (Signed)
Chief Complaint  Patient presents with   Follow-up    Rm 15. PCP is Carylon Perches, NP. Accompanied by friend, [place]. C/o breakthrough tremors, usually later in the day. evaluation of possible PD.      ASSESSMENT AND PLAN  [name] is a 76 y.o. female   Bilateral hands tremor,  This can be related to her significant anxiety, exaggerated physiological tremor,  No parkinsonian features,  TSH to rule out thyroid malfunction,  Not sure about the benefit of primidone, she is already on polypharmacy for anxiety, I have suggested her stop primidone, which is an enzyme inducer,  History of partial seizure,  On stable dose of Carbatrol 200 mg every day   DIAGNOSTIC DATA (LABS, IMAGING, TESTING) - I reviewed patient records, labs, notes, testing and imaging myself where available.   MEDICAL HISTORY:  Erica Bennett is a 76 year old female, accompanied by her husband at today's clinical visit, she was a patient of Dr. Jannifer Franklin in the past, today she is accompanied by her friend Opal Sidles for evaluation of worsening tremor  I reviewed and summarized the referring note. PMHx. Partial seizure, Carbatrol 230m qhs, last seizure was in 2005. Mixed connective tissue disease. Chronic migraine, Maxalt prn Chronic low back pain,  Chronic insomnia Anxiety  She was last seen by SJudson Rochin December 2022, she had a history of partial seizure, has been on brand-name Carbatrol 200 mg daily for many years, has not had recurrent seizure for many years  She has suffered significant anxiety when her husband committed suicide in front of her in April 2022,  She is blind for many years, mother suffered with rubella infection during pregnancy, she was born with bilateral retinal scarring, she become legally blind at age 76 then developed glaucoma, infection, required bilateral surgery  She denies a family history of tremor, around 2021, she noticed intermittent bilateral hands tremor, become much  worse and since the incident of her husband and increased anxiety,  Her anxiety is under better control now with treatment, but she still has intermittent bilateral hands tremor, sometimes lower extremity tremor, difficulty bearing weight,  She was put on primidone treatment since January 2023, not sure about the benefit  She still lives by herself with her cat, her friends drove her around   PHYSICAL EXAM:   Vitals:   03/24/21 1510  BP: 134/81  Pulse: (!) 58  Weight: 150 lb 8 oz (68.3 kg)  Height: _0  (1.651 m)   Not recorded     Body mass index is 25.04 kg/m.  PHYSICAL EXAMNIATION:  Gen: NAD, conversant, well nourised, well groomed                     Cardiovascular: Regular rate rhythm, no peripheral edema, warm, nontender. Eyes: Conjunctivae clear without exudates or hemorrhage Neck: Supple, no carotid bruits. Pulmonary: Clear to auscultation bilaterally   NEUROLOGICAL EXAM:  MENTAL STATUS: Speech:    Speech is normal; fluent and spontaneous with normal comprehension.  Cognition:     Orientation to time, place and person     Normal recent and remote memory     Normal Attention span and concentration     Normal Language, naming, repeating,spontaneous speech     Fund of knowledge   CRANIAL NERVES: CN II: Bilateral prosthetic eye CN III, IV, VI: extraocular movement are normal. No ptosis. CN V: Facial sensation is intact to light touch CN VII: Face is symmetric with normal eye closure  CN VIII: Hearing is normal to causal conversation. CN IX, X: Phonation is normal. CN XI: Head turning and shoulder shrug are intact  MOTOR: Bilateral upper extremity motor strength is normal, no significant action tremor, no rigidity or bradykinesia  REFLEXES: Reflexes are 2+ and symmetric at the biceps, triceps, knees, and ankles. Plantar responses are flexor.  SENSORY: Intact to light touch, pinprick and vibratory sensation are intact in fingers and  toes.  COORDINATION: There is no trunk or limb dysmetria noted.  GAIT/STANCE: Posture is normal. Gait is steady with normal steps, base, arm swing, and turning. Heel and toe walking are normal. Tandem gait is normal.  Romberg is absent.  REVIEW OF SYSTEMS:  Full 14 system review of systems performed and notable only for as above All other review of systems were negative.   ALLERGIES: Allergies  Allergen Reactions   Bacitracin Rash   Neomycin Rash   Sulfa Antibiotics Swelling and Rash   Sulfamethoxazole Swelling and Other (See Comments)   Eggs Or Egg-Derived Products Diarrhea and Nausea And Vomiting    Stomach cramps   Elavil [Amitriptyline Hcl] Other (See Comments)    Really bad tremors   Gabapentin Other (See Comments)    Balance issues   Mixed Ragweed     Nasal congestion   Prozac [Fluoxetine Hcl]     Severe headache    HOME MEDICATIONS: Current Outpatient Medications  Medication Sig Dispense Refill   ALPRAZolam (XANAX) 1 MG tablet Take 1 tablet (1 mg total) by mouth 3 (three) times daily as needed for anxiety. 90 tablet 3   CARBATROL 200 MG 12 hr capsule Take 1 capsule (200 mg total) by mouth daily. 90 capsule 3   Cholecalciferol (VITAMIN D3 PO) Take 1 tablet by mouth daily.     DEXILANT 60 MG capsule TAKE ONE CAPSULE BY MOUTH DAILY; ON AN EMPTY STOMACH, THEN DO NOT EAT FOR ONE HOUR. (Patient taking differently: Take 60 mg by mouth daily before breakfast.) 30 capsule 5   DULoxetine (CYMBALTA) 60 MG capsule Take 1 capsule (60 mg total) by mouth 2 (two) times daily. 180 capsule 3   HYDROcodone-acetaminophen (NORCO/VICODIN) 5-325 MG tablet Take 1 tablet by mouth every 6 (six) hours as needed for moderate pain.     hydroxychloroquine (PLAQUENIL) 200 MG tablet Take 1 tablet (200 mg total) by mouth 2 (two) times daily. (Patient taking differently: Take 100 mg by mouth 2 (two) times daily. Take four 2 tablets by mouth with food or milk once daily.) 180 tablet 1   MAXALT 10 MG  tablet Take 1 tablet (10 mg total) by mouth as needed. (Patient taking differently: Take 10 mg by mouth 3 (three) times daily as needed for migraine.) 10 tablet 5   metoprolol succinate (TOPROL-XL) 100 MG 24 hr tablet Take 1 tablet (100 mg total) by mouth every evening. (Patient taking differently: Take 100 mg by mouth every evening.) 90 tablet 0   NARCAN 4 MG/0.1ML LIQD nasal spray kit Place 1 spray into the nose as needed (accidental overdose.).     NIACIN PO Take by mouth daily.     ondansetron (ZOFRAN) 4 MG tablet Take 4 mg by mouth every 8 (eight) hours as needed for nausea or vomiting.     primidone (MYSOLINE) 50 MG tablet Take 1 tablet (50 mg total) by mouth at bedtime. 30 tablet 2   traZODone (DESYREL) 100 MG tablet Take 1 tablet (100 mg total) by mouth at bedtime. (Patient taking differently: Take 100 mg  by mouth at bedtime as needed.) 30 tablet 2   VITAMIN D PO Take 1 tablet by mouth daily.     No current facility-administered medications for this visit.    PAST MEDICAL HISTORY: Past Medical History:  Diagnosis Date   Anxiety    Blindness of both eyes    due to degenerative retina and acute glaucoma   Complication of anesthesia    pt has limited mouth opening due to bilateral tmj surgery   Fecal incontinence    Fibromyalgia    Generalized seizure disorder [facility])    neurologist-- dr Jannifer Franklin--  seizure is staring off and dropping things  (12-20-2019  per pt last seizure approx. 2005)   GERD (gastroesophageal reflux disease)    H/O Hashimoto thyroiditis    History of acute angle-closure glaucoma    bilateral eyes  s/p eye removal, now has prosthesis   History of hypertension    History of skin cancer    History of thyroid nodule    bx left thyroid nodule 11-17-2017;  s/p left thyroidectomy 11-07-2018, benign mass   Hyperlipidemia    Limitation of opening of mouth    per pt due to bilateral tmj surgery 1989   MDD (major depressive disorder)    Migraines    Mixed connective  tissue disease ([place])    rheumotologist-- dr Amil Amen--  taking plaquenil   OA (osteoarthritis)    OSA (obstructive sleep apnea)    does not use CPAP   Osteoporosis    PONV (postoperative nausea and vomiting)    and hard to wake   Prosthetic eye globe    bilateral   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 ;  s/p enucleation     PAST SURGICAL HISTORY: Past Surgical History:  Procedure Laterality Date   ABDOMINAL HYSTERECTOMY  1980s   AND APPENDECTOMY   ENUCLEATION Right 1984   right eye   EVISCERATION Left 1996   left eye   [place] ARTHROSCOPY WITH SUBACROMIAL DECOMPRESSION Right 09-15-2007  _0    AND BURSECTOMY/  DEBRIDEMENT ROTATOR CUFF   TEMPOROMANDIBULAR JOINT SURGERY Bilateral 1989   THYROIDECTOMY Left 11/07/2018   Procedure: LEFT HEMI THYROIDECTOMY;  Surgeon: Leta Baptist, MD;  Location: [facility];  Service: ENT;  Laterality: Left;   TONSILLECTOMY  child    FAMILY HISTORY: Family History  Problem Relation Age of Onset   Colon cancer Mother    Parkinson's disease Mother    Depression Maternal Aunt    Colon cancer Maternal Aunt    Depression Paternal Aunt    Alcohol abuse Paternal Uncle    Drug abuse Paternal Uncle    Parkinson's disease Maternal Grandfather    Depression Cousin    Colon cancer Other     SOCIAL HISTORY: Social History   Socioeconomic History   Marital status: Married    Spouse name: Not on file   Number of children: 0   Years of education: 18   Highest education level: Not on file  Occupational History   Occupation: Retired  Tobacco Use   Smoking status: Former    Years: 20.00    Types: Cigarettes    Quit date: 01/03/1992    Years since quitting: 29.2   Smokeless tobacco: Never  Vaping Use   Vaping Use: Never used  Substance and Sexual Activity   Alcohol use: No    Alcohol/week: 0.0 standard drinks  Drug use: Never   Sexual  activity: Yes    Birth control/protection: Surgical  Other Topics Concern   Not on file  Social History Narrative   POA-[place]   Caffeine use: daily (1 soda per day, 1 coffee per day)   Left handed    Legally blind      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 broadcast assistant Strain: Not on file  Food Insecurity: Not on file  Transportation Needs: Not on file  Physical Activity: Not on file  Stress: Not on file  Social Connections: Not on file  Intimate Partner Violence: Not on file      Marcial Pacas, M.D. Ph.D.  [facility] [address] [place], [place] [address] Ph: [phone] Fax: [phone]  CC:  Suzzanne Cloud, NP [address] [place],  [place] [address]  Carylon Perches, NP

## 2021-03-25 ENCOUNTER — Telehealth: Payer: Self-pay | Admitting: *Deleted

## 2021-03-25 LAB — TSH: TSH: 1.94 u[IU]/mL (ref 0.450–4.500)

## 2021-03-25 NOTE — Telephone Encounter (Signed)
-----   Message from Marcial Pacas, MD sent at 03/25/2021  8:26 AM EST ----- Please call patient, TSH was normal.

## 2021-03-25 NOTE — Telephone Encounter (Signed)
I spoke to the patient and notified her of the lab results.

## 2021-03-28 ENCOUNTER — Ambulatory Visit (INDEPENDENT_AMBULATORY_CARE_PROVIDER_SITE_OTHER): Payer: Medicare Other | Admitting: Psychiatry

## 2021-03-28 ENCOUNTER — Other Ambulatory Visit: Payer: Self-pay

## 2021-03-28 DIAGNOSIS — F321 Major depressive disorder, single episode, moderate: Secondary | ICD-10-CM

## 2021-03-28 NOTE — Progress Notes (Signed)
Virtual Visit via Telephone Note ? ?I connected with Erica Bennett on 03/28/21 at 10:00 AM EST by telephone and verified that I am speaking with the correct person using two identifiers. ? ?Location: ?Patient: Home ?Provider: [facility] Out[facility] [place] office  ?  ?I discussed the limitations, risks, security and privacy concerns of performing an evaluation and management service by telephone and the availability of in person appointments. I also discussed with the patient that there may be a patient responsible charge related to this service. The patient expressed understanding and agreed to proceed. ? ? ? ?I provided 50 minutes of non-face-to-face time during this encounter. ? ? ?Julina Altmann E Kylle Lall, LCSW ? ? ? ?         ?THERAPIST PROGRESS NOTE ? ? ? ? ?Session Time:  Friday 03/28/2021  10:00 AM - 10:50 AM  ? ?Participation Level: Active ? ?Behavioral Response: AlertAnxious/  ? ?Type of Therapy: Individual Therapy ? ?Treatment Goals addressed:  Improve ability to manage event recollections without experiencing intense arousal (nausea, shaking) AEB patient practicing calming and grounding techniques and experiencing a reduction in intensity of episodes (from 8 to a 4 on 10 point scale per patient's report) ? ?Progress on Goals: ? ? ?Interventions: CBT and Supportive ? ?Summary: Erica Bennett is a 76 y.o. female who  is self -referred and is a returning patient to this clinician. She denies any psychiatric hospitalizations. She continues to see psychiatrist Dr. Harrington Challenger  for medicatiion management.  Patient is resuming services due to grief and loss issues, worry, anxiety, and intrusive recollections of husband's death.  Her husband died by suicide in 15-May-2022 of this year.  He shot himself in the head in patient's presence.  Patient is blind and reports she had to touch the entry and exit areas made by the bullet in order to give information to 911 dispatcher when she called for help for husband.   ? ?Patient last was seen  via virtual visit about 2-3 weeks ago.  She reports no episodes of heightened arousal since last session.  She reports a recent event triggered memories of the 911 call she made on the night of her husband's death and says this caused some stress and anxiety but no intense arousal.  She continues to report feeling good about her progress and coping with the death of her husband.  However, she reports increased frustration regarding managing current business matters as she cannot find paperwork that she thought her husband kept in on file.  She also reports continued adjustment to taking care of tasks her husband normally would have done.  Patient reports beginning to adjust better to being a widow versus being part of a couple.  She continues to participate in various activities including attending church and socializing with friends.  She also reports increased thoughts of husband as his birthday approaches as it is on March [birth year]. ? ?Suicidal/Homicidal: Nowithout intent/plan      ? ?Therapist Response:, reviewed symptoms, discussed stressors, facilitated expression of thoughts and feelings, validated feelings, continued to discus tasks of mourning, assisted patient identify the task continued to discuss internal adjustments, began to discuss external adjustments, assisted patient identify new roles/new tasks/new skills she has assumed or developed since husband's death ? ?Plan return in 2 weeks           ? ?Diagnosis: Axis I: Major Depression, single episode ? ?  Axis II: No diagnosis ? ?Collaboration of Care: Psychiatrist AEB  patient works with psychiatrist Dr.  Ross.  ? ?Patient/Guardian was advised Release of Information must be obtained prior to any record release in order to collaborate their care with an outside provider. Patient/Guardian was advised if they have not already done so to contact the registration department to sign all necessary forms in order for [place] to release information regarding their care.   ? ?Consent: Patient/Guardian gives verbal consent for treatment and assignment of benefits for services provided during this visit. Patient/Guardian expressed understanding and agreed to proceed.  ? ?[place], LCSW ?03/28/2021  ? ? ? ? ? ? ? ? ? ? ?

## 2021-03-31 ENCOUNTER — Encounter (HOSPITAL_COMMUNITY): Payer: Self-pay | Admitting: Psychiatry

## 2021-03-31 ENCOUNTER — Telehealth (INDEPENDENT_AMBULATORY_CARE_PROVIDER_SITE_OTHER): Payer: Medicare Other | Admitting: Psychiatry

## 2021-03-31 ENCOUNTER — Other Ambulatory Visit: Payer: Self-pay

## 2021-03-31 DIAGNOSIS — F321 Major depressive disorder, single episode, moderate: Secondary | ICD-10-CM | POA: Diagnosis not present

## 2021-03-31 MED ORDER — ALPRAZOLAM 1 MG PO TABS: 1.0000 mg | ORAL_TABLET | Freq: Two times a day (BID) | ORAL | 2 refills | Status: DC | PRN

## 2021-03-31 MED ORDER — PRIMIDONE 50 MG PO TABS: 50.0000 mg | ORAL_TABLET | Freq: Every day | ORAL | 2 refills | Status: DC

## 2021-03-31 MED ORDER — DULOXETINE HCL 60 MG PO CPEP: 60.0000 mg | ORAL_CAPSULE | Freq: Two times a day (BID) | ORAL | 3 refills | Status: DC
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
## 2021-04-11 ENCOUNTER — Other Ambulatory Visit: Payer: Self-pay

## 2021-04-11 ENCOUNTER — Ambulatory Visit (INDEPENDENT_AMBULATORY_CARE_PROVIDER_SITE_OTHER): Payer: Medicare Other | Admitting: Psychiatry

## 2021-04-11 DIAGNOSIS — F321 Major depressive disorder, single episode, moderate: Secondary | ICD-10-CM | POA: Diagnosis not present
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
## 2021-05-08 ENCOUNTER — Ambulatory Visit (INDEPENDENT_AMBULATORY_CARE_PROVIDER_SITE_OTHER): Payer: Medicare Other | Admitting: Psychiatry

## 2021-05-08 DIAGNOSIS — F321 Major depressive disorder, single episode, moderate: Secondary | ICD-10-CM
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
## 2021-05-22 ENCOUNTER — Other Ambulatory Visit: Payer: Self-pay | Admitting: Otolaryngology

## 2021-05-22 ENCOUNTER — Ambulatory Visit (INDEPENDENT_AMBULATORY_CARE_PROVIDER_SITE_OTHER): Payer: Medicare Other | Admitting: Psychiatry

## 2021-05-22 ENCOUNTER — Other Ambulatory Visit (HOSPITAL_COMMUNITY): Payer: Self-pay | Admitting: Otolaryngology

## 2021-05-22 DIAGNOSIS — F321 Major depressive disorder, single episode, moderate: Secondary | ICD-10-CM | POA: Diagnosis not present

## 2021-05-22 DIAGNOSIS — R221 Localized swelling, mass and lump, neck: Secondary | ICD-10-CM

## 2021-05-22 NOTE — Progress Notes (Signed)
Virtual Visit via Telephone Note ? ?I connected with [name] on 05/22/21 at 9:09 AM EDT by telephone and verified that I am speaking with the correct person using two identifiers. ? ?Location: ?Patient: Home ?Provider: [facility] [place] office  ?  ?I discussed the limitations, risks, security and privacy concerns of performing an evaluation and management service by telephone and the availability of in person appointments. I also discussed with the patient that there may be a patient responsible charge related to this service. The patient expressed understanding and agreed to proceed. ? ? ? ?I provided 46 minutes of non-face-to-face time during this encounter. ? ? ?Hally Colella E Josearmando Kuhnert, LCSW ? ? ? ?         ?THERAPIST PROGRESS NOTE ? ? ? ? ?Session Time:  Thursday 05/21/2021 9:09 AM - 9:55 AM  ? ?Participation Level: Active ? ?Behavioral Response: AlertAnxious/  ? ?Type of Therapy: Individual Therapy ? ?Treatment Goals addressed:  Improve ability to manage event recollections without experiencing intense arousal (nausea, shaking) AEB patient practicing calming and grounding techniques and experiencing a reduction in intensity of episodes (from 8 to a 4 on 10 point scale per patient's report) ? ?Progress on Goals: progressing  ? ? ?Interventions: CBT and Supportive ? ?Summary: QUANITA BARONA is a 76 y.o. female who  is self -referred and is a returning patient to this clinician. She denies any psychiatric hospitalizations. She continues to see psychiatrist Dr. Harrington Challenger  for medicatiion management.  Patient is resuming services due to grief and loss issues, worry, anxiety, and intrusive recollections of husband's death.  Her husband died by suicide in 05/18/22 of this year.  He shot himself in the head in patient's presence.  Patient is blind and reports she had to touch the entry and exit areas made by the bullet in order to give information to 911 dispatcher when she called for help for husband.   ? ?Patient  last was seen via virtual visit about 2-3 weeks ago.  She reports being a little sad since last session but reports no moments of intense arousal.  She also reports having some good moments.  Patient has been experiencing more thoughts about husband and states dreading the upcoming anniversary of his death on Jun 02, 2022.  However, she plans to attend 2 masses for her husband and has invited his family.  Patient reports still trying to figure out her identity and adjusting to life without her husband.  She states not feeling comfortable with self.  She reports often comparing self to other widows.  Patient maintains involvement in activities doing light household task, attending church, reading, taking care of her pet, and socializing with friends.   ? ?Suicidal/Homicidal: Nowithout intent/plan      ? ?Therapist Response:, reviewed symptoms, praised and reinforced patient's efforts to plan for the anniversary of her husband's death, discussed ways to cope including acknowledging her feelings and balancing painful memories with positive memories, normalized feelings related to acute grief triggered by anniversaries of a special days, assisted patient examine and processed her thoughts and feelings regarding her identity and being a widow, assisted patient identify and replace unhelpful thoughts with helpful thoughts. ?        ?Plan return in 2 weeks           ? ?Diagnosis: Axis I: Major Depression, single episode ? ?  Axis II: No diagnosis ? ?Collaboration of Care: Psychiatrist AEB  patient works with psychiatrist Dr. Harrington Challenger.  , Clinician  reviewing chart ? ?Patient/Guardian was advised Release of Information must be obtained prior to any record release in order to collaborate their care with an outside provider. Patient/Guardian was advised if they have not already done so to contact the registration department to sign all necessary forms in order for [place] to release information regarding their care.  ? ?Consent:  Patient/Guardian gives verbal consent for treatment and assignment of benefits for services provided during this visit. Patient/Guardian expressed understanding and agreed to proceed.  ? ?[place], LCSW ?05/22/2021  ? ? ? ? ? ? ? ? ? ? ? ?

## 2021-06-05 ENCOUNTER — Ambulatory Visit (INDEPENDENT_AMBULATORY_CARE_PROVIDER_SITE_OTHER): Payer: Medicare Other | Admitting: Psychiatry

## 2021-06-05 DIAGNOSIS — F321 Major depressive disorder, single episode, moderate: Secondary | ICD-10-CM | POA: Diagnosis not present

## 2021-06-05 NOTE — Progress Notes (Signed)
Virtual Visit via Telephone Note  I connected with Erica Bennett on 06/05/21 at 9:02 AM EDt by telephone and verified that I am speaking with the correct person using two identifiers.  Location: Patient: Home Provider: [place] office    I discussed the limitations, risks, security and privacy concerns of performing an evaluation and management service by telephone and the availability of in person appointments. I also discussed with the patient that there may be a patient responsible charge related to this service. The patient expressed understanding and agreed to proceed.   I provided 48 minutes of non-face-to-face time during this encounter.   [name], LCSW             THERAPIST PROGRESS NOTE     Session Time:  Thursday 06/05/2021 9:02 AM - 9:50 AM   Participation Level: Active  Behavioral Response: AlertAnxious/   Type of Therapy: Individual Therapy  Treatment Goals addressed:  Improve ability to manage event recollections without experiencing intense arousal (nausea, shaking) AEB patient practicing calming and grounding techniques and experiencing a reduction in intensity of episodes (from 8 to a 4 on 10 point scale per patient's report)  Progress on Goals: progressing    Interventions: CBT and Supportive  Summary: Erica Bennett is a 76 y.o. female who  is self -referred and is a returning patient to this clinician. She denies any psychiatric hospitalizations. She continues to see psychiatrist Dr. Harrington Challenger  for medicatiion management.  Patient is resuming services due to grief and loss issues, worry, anxiety, and intrusive recollections of husband's death.  Her husband died by suicide in 2022-05-20 of this year.  He shot himself in the head in patient's presence.  Patient is blind and reports she had to touch the entry and exit areas made by the bullet in order to give information to 911 dispatcher when she called for help for husband.    Patient last  was seen via virtual visit about 2-3 weeks ago.  She reports increased stress and sadness triggered by the first anniversary of her husband's death.  She also reports sleep difficulty, fatigue, and procrastination.  She states she has been waking up about 3-5 times per night.  She reports attending a mass in honor of her husband's death.  She reports still having strong support from friends.  She reports she has still been going out and has avoided isolating self.  However, she expresses frustration as she thought she would be at peace after the anniversary but is not.  She has thoughts of she should be over this by now as well as thoughts of self blame along with feelings of guilt and shame.  She also has thoughts of being a failure.  Suicidal/Homicidal: Nowithout intent/plan       Therapist Response:, reviewed symptoms, praised and reinforced patient's efforts to try to cope with anniversary of husband's death in a healthy way, assisted patient began to examine her thought patterns about length of grieving and identify realistic expectations of self, began to assist patient examine thought patterns regarding the cause of husband's death, began to discuss stuck points and effects on patient regarding processing grief, will discuss more at next session   Plan return in 2 weeks            Diagnosis: Axis I: Major Depression, single episode    Axis II: No diagnosis  Collaboration of Care: Psychiatrist AEB  patient works with psychiatrist Dr. Harrington Challenger.  , Clinician reviewing  chart  Patient/Guardian was advised Release of Information must be obtained prior to any record release in order to collaborate their care with an outside provider. Patient/Guardian was advised if they have not already done so to contact the registration department to sign all necessary forms in order for [place] to release information regarding their care.   Consent: Patient/Guardian gives verbal consent for treatment and assignment of  benefits for services provided during this visit. Patient/Guardian expressed understanding and agreed to proceed.   [name], LCSW 06/05/2021

## 2021-06-20 ENCOUNTER — Other Ambulatory Visit (HOSPITAL_COMMUNITY): Payer: Self-pay | Admitting: Psychiatry

## 2021-06-30 ENCOUNTER — Other Ambulatory Visit (HOSPITAL_COMMUNITY): Payer: Self-pay | Admitting: Psychiatry

## 2021-06-30 NOTE — Telephone Encounter (Signed)
Call for appt

## 2021-07-01 ENCOUNTER — Encounter (HOSPITAL_COMMUNITY): Payer: Self-pay | Admitting: Psychiatry

## 2021-07-01 ENCOUNTER — Telehealth (INDEPENDENT_AMBULATORY_CARE_PROVIDER_SITE_OTHER): Payer: Medicare Other | Admitting: Psychiatry

## 2021-07-01 ENCOUNTER — Ambulatory Visit (HOSPITAL_COMMUNITY)
Admission: RE | Admit: 2021-07-01 | Discharge: 2021-07-01 | Disposition: A | Discharge status: Home / Self Care | Payer: Medicare Other | Source: Ambulatory Visit | Attending: Otolaryngology | Admitting: Otolaryngology

## 2021-07-01 DIAGNOSIS — R221 Localized swelling, mass and lump, neck: Secondary | ICD-10-CM | POA: Insufficient documentation

## 2021-07-01 DIAGNOSIS — F321 Major depressive disorder, single episode, moderate: Secondary | ICD-10-CM | POA: Diagnosis not present

## 2021-07-01 MED ORDER — IOHEXOL 300 MG/ML  SOLN
100.0000 mL | Freq: Once | INTRAMUSCULAR | Status: AC | PRN
  Administered 2021-07-01: 75 mL via INTRAVENOUS

## 2021-07-01 MED ORDER — ALPRAZOLAM 1 MG PO TABS: 1.0000 mg | ORAL_TABLET | Freq: Two times a day (BID) | ORAL | 2 refills | Status: DC | PRN

## 2021-07-01 MED ORDER — DULOXETINE HCL 60 MG PO CPEP: 60.0000 mg | ORAL_CAPSULE | Freq: Two times a day (BID) | ORAL | 3 refills | Status: DC

## 2021-07-01 NOTE — Progress Notes (Signed)
Virtual Visit via Telephone Note  I connected with Erica Bennett on 07/01/21 at  9:00 AM EDT by telephone and verified that I am speaking with the correct person using two identifiers.  Location: Patient: home Provider: office   I discussed the limitations, risks, security and privacy concerns of performing an evaluation and management service by telephone and the availability of in person appointments. I also discussed with the patient that there may be a patient responsible charge related to this service. The patient expressed understanding and agreed to proceed.      I discussed the assessment and treatment plan with the patient. The patient was provided an opportunity to ask questions and all were answered. The patient agreed with the plan and demonstrated an understanding of the instructions.   The patient was advised to call back or seek an in-person evaluation if the symptoms worsen or if the condition fails to improve as anticipated.  I provided 20 minutes of non-face-to-face time during this encounter.   [name], MD  [facility] MD/PA/NP OP Progress Note  07/01/2021 9:20 AM Erica Bennett  MRN:  [id number]  Chief Complaint:  Chief Complaint  Patient presents with   Anxiety   Depression   Follow-up   HPI: This patient is a 76 year old widowed white female who lives alone in [place].  She has no children.  She is on disability for congenital blindness.  The patient returns for follow-up after 3 months.  For the most part she is doing okay.  She still shakes a lot particularly if she misses a dose of Xanax or Mysoline.  She is still coming to terms with her husband suicide which was a year ago April.  She is in therapy and this is helping as well is her supportive friends and church.  She is sleeping about 7 hours with a few awakenings but she does not feel too tired during the day.  She does have chronic fatigue and autoimmune disorder.  She states that her dentist found a lump  in her jaw and she has to have a CT scan today and she is somewhat worried about this.  Nevertheless she is trying to stay positive she denies significant depression anxiety or thoughts of self-harm Visit Diagnosis:    ICD-10-CM   1. Moderate single current episode of major depressive disorder (HCC)  F32.1       Past Psychiatric History: Prior outpatient treatment  Past Medical History:  Past Medical History:  Diagnosis Date   Anxiety    Blindness of both eyes    due to degenerative retina and acute glaucoma   Complication of anesthesia    pt has limited mouth opening due to bilateral tmj surgery   Fecal incontinence    Fibromyalgia    Generalized seizure disorder [facility])    neurologist-- dr Jannifer Franklin--  seizure is staring off and dropping things  (12-20-2019  per pt last seizure approx. 2005)   GERD (gastroesophageal reflux disease)    H/O Hashimoto thyroiditis    History of acute angle-closure glaucoma    bilateral eyes  s/p eye removal, now has prosthesis   History of hypertension    History of skin cancer    History of thyroid nodule    bx left thyroid nodule 11-17-2017;  s/p left thyroidectomy 11-07-2018, benign mass   Hyperlipidemia    Limitation of opening of mouth    per pt due to bilateral tmj surgery 1989   MDD (major depressive disorder)  Migraines    Mixed connective tissue disease ([place])    rheumotologist-- dr Amil Amen--  taking plaquenil   OA (osteoarthritis)    OSA (obstructive sleep apnea)    does not use CPAP   Osteoporosis    PONV (postoperative nausea and vomiting)    and hard to wake   Prosthetic eye globe    bilateral   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 ;  s/p enucleation     Past Surgical History:  Procedure Laterality Date   ABDOMINAL HYSTERECTOMY  1980s   AND APPENDECTOMY   ENUCLEATION Right 1984   right eye   EVISCERATION Left 1996   left eye   [place] ARTHROSCOPY WITH SUBACROMIAL DECOMPRESSION Right 09-15-2007  _0    AND BURSECTOMY/  DEBRIDEMENT ROTATOR CUFF   TEMPOROMANDIBULAR JOINT SURGERY Bilateral 1989   THYROIDECTOMY Left 11/07/2018   Procedure: LEFT HEMI THYROIDECTOMY;  Surgeon: Leta Baptist, MD;  Location: [place];  Service: ENT;  Laterality: Left;   TONSILLECTOMY  child    Family Psychiatric History: see below  Family History:  Family History  Problem Relation Age of Onset   Colon cancer Mother    Parkinson's disease Mother    Depression Maternal Aunt    Colon cancer Maternal Aunt    Depression Paternal Aunt    Alcohol abuse Paternal Uncle    Drug abuse Paternal Uncle    Parkinson's disease Maternal Grandfather    Depression Cousin    Colon cancer Other     Social History:  Social History   Socioeconomic History   Marital status: Married    Spouse name: Not on file   Number of children: 0   Years of education: 18   Highest education level: Not on file  Occupational History   Occupation: Retired  Tobacco Use   Smoking status: Former    Years: 20.00    Types: Cigarettes    Quit date: 01/03/1992    Years since quitting: 29.5   Smokeless tobacco: Never  Vaping Use   Vaping Use: Never used  Substance and Sexual Activity   Alcohol use: No    Alcohol/week: 0.0 standard drinks   Drug use: Never   Sexual activity: Yes    Birth control/protection: Surgical  Other Topics Concern   Not on file  Social History Narrative   POA-[place]   Caffeine use: daily (1 soda per day, 1 coffee per day)   Left handed    Legally blind      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 broadcast assistant Strain: Not on file  Food Insecurity: Not on file  Transportation Needs: Not on file  Physical Activity: Not on file  Stress: Not on file  Social Connections: Not on file    Allergies:  Allergies  Allergen Reactions   Bacitracin Rash   Neomycin Rash   Sulfa  Antibiotics Swelling and Rash   Sulfamethoxazole Swelling and Other (See Comments)   Eggs Or Egg-Derived Products Diarrhea and Nausea And Vomiting    Stomach cramps   Elavil [Amitriptyline Hcl] Other (See Comments)    Really bad tremors   Gabapentin Other (See Comments)    Balance issues   Mixed Ragweed     Nasal congestion   Prozac [Fluoxetine Hcl]     Severe headache    Metabolic Disorder Labs: No results  found for: HGBA1C, MPG No results found for: PROLACTIN Lab Results  Component Value Date   CHOL 203 (H) 11/18/2017   TRIG 227 (H) 11/18/2017   HDL 51 11/18/2017   CHOLHDL 4.0 11/18/2017   LDLCALC 107 (H) 11/18/2017   Lab Results  Component Value Date   TSH 1.940 03/24/2021   TSH 2.940 03/02/2018    Therapeutic Level Labs: No results found for: LITHIUM No results found for: VALPROATE No components found for:  CBMZ  Current Medications: Current Outpatient Medications  Medication Sig Dispense Refill   ALPRAZolam (XANAX) 1 MG tablet Take 1 tablet (1 mg total) by mouth 2 (two) times daily as needed. for anxiety 60 tablet 2   CARBATROL 200 MG 12 hr capsule Take 1 capsule (200 mg total) by mouth daily. 90 capsule 3   Cholecalciferol (VITAMIN D3 PO) Take 1 tablet by mouth daily.     DEXILANT 60 MG capsule TAKE ONE CAPSULE BY MOUTH DAILY; ON AN EMPTY STOMACH, THEN DO NOT EAT FOR ONE HOUR. (Patient taking differently: Take 60 mg by mouth daily before breakfast.) 30 capsule 5   DULoxetine (CYMBALTA) 60 MG capsule Take 1 capsule (60 mg total) by mouth 2 (two) times daily. 180 capsule 3   HYDROcodone-acetaminophen (NORCO/VICODIN) 5-325 MG tablet Take 1 tablet by mouth every 6 (six) hours as needed for moderate pain.     hydroxychloroquine (PLAQUENIL) 200 MG tablet Take 1 tablet (200 mg total) by mouth 2 (two) times daily. (Patient taking differently: Take 100 mg by mouth 2 (two) times daily. Take four 2 tablets by mouth with food or milk once daily.) 180 tablet 1   MAXALT 10 MG  tablet Take 1 tablet (10 mg total) by mouth as needed. (Patient taking differently: Take 10 mg by mouth 3 (three) times daily as needed for migraine.) 10 tablet 5   metoprolol succinate (TOPROL-XL) 100 MG 24 hr tablet Take 1 tablet (100 mg total) by mouth every evening. (Patient taking differently: Take 100 mg by mouth every evening.) 90 tablet 0   NARCAN 4 MG/0.1ML LIQD nasal spray kit Place 1 spray into the nose as needed (accidental overdose.).     NIACIN PO Take by mouth daily.     ondansetron (ZOFRAN) 4 MG tablet Take 4 mg by mouth every 8 (eight) hours as needed for nausea or vomiting.     primidone (MYSOLINE) 50 MG tablet TAKE ONE TABLET BY MOUTH AT BEDTIME 30 tablet 2   VITAMIN D PO Take 1 tablet by mouth daily.     No current facility-administered medications for this visit.     Musculoskeletal: Strength & Muscle Tone: na Gait & Station: na Patient leans: N/A  Psychiatric Specialty Exam: Review of Systems  Constitutional:  Positive for fatigue.  Eyes:  Positive for visual disturbance.  Musculoskeletal:  Positive for arthralgias and myalgias.  Neurological:  Positive for tremors.  Psychiatric/Behavioral:  The patient is nervous/anxious.   All other systems reviewed and are negative.  There were no vitals taken for this visit.There is no height or weight on file to calculate BMI.  General Appearance: NA  Eye Contact:  NA  Speech:  Clear and Coherent  Volume:  Normal  Mood:  Anxious and Euthymic  Affect:  NA  Thought Process:  Goal Directed  Orientation:  Full (Time, Place, and Person)  Thought Content: Rumination   Suicidal Thoughts:  No  Homicidal Thoughts:  No  Memory:  Immediate;   Good Recent;   Good  Remote;   Good  Judgement:  Good  Insight:  Good  Psychomotor Activity:  Decreased  Concentration:  Concentration: Good and Attention Span: Good  Recall:  Good  Fund of Knowledge: Good  Language: Good  Akathisia:  No  Handed:  Right  AIMS (if indicated): not  done  Assets:  Communication Skills Desire for Improvement Resilience Social Support Talents/Skills  ADL's:  Intact  Cognition: WNL  Sleep:  Fair   Screenings: GAD-7    Health and safety inspector from 03/30/2019 in [facility] [facility]  Total GAD-7 Score 2      PHQ2-9    Flowsheet Row Video Visit from 07/01/2021 in [place] Video Visit from 03/31/2021 in [facility] Video Visit from 01/30/2021 in [place] Video Visit from 01/07/2021 in [place] Video Visit from 11/27/2020 in [facility] [facility]  PHQ-2 Total Score 0 0 0 1 0      Flowsheet Row Video Visit from 07/01/2021 in [place] Video Visit from 03/31/2021 in [place] [facility] Video Visit from 01/30/2021 in [place] No Risk No Risk No Risk        Assessment and Plan: This patient is a 76 year old female with a history of depression anxiety as well as PTSD relating to her husband suicide.  She continues to do fairly well.  She will continue Xanax 1 mg up to twice daily for anxiety Cymbalta 60 mg daily for depression and fibromyalgia and Mysoline 50 mg in the evening for tremor.  She will return to see me in 3 months  Collaboration of Care: Collaboration of Care: Referral or follow-up with counselor/therapist AEB patient continues to follow with Maurice Small therapist in our  Patient/Guardian was advised Release of Information must be obtained prior to any record release in order to collaborate their care with an outside provider. Patient/Guardian was advised if they have not already done so to contact the registration department to sign all necessary  forms in order for [place] to release information regarding their care.   Consent: Patient/Guardian gives verbal consent for treatment and assignment of benefits for services provided during this visit. Patient/Guardian expressed understanding and agreed to proceed.    [name], MD 07/01/2021, 9:20 AM

## 2021-07-02 ENCOUNTER — Ambulatory Visit (INDEPENDENT_AMBULATORY_CARE_PROVIDER_SITE_OTHER): Payer: Medicare Other | Admitting: Psychiatry

## 2021-07-02 DIAGNOSIS — F321 Major depressive disorder, single episode, moderate: Secondary | ICD-10-CM

## 2021-07-02 NOTE — Progress Notes (Signed)
Virtual Visit via Telephone Note  I connected with Erica Bennett on 07/02/21 at 9:10 AM EDT  by telephone and verified that I am speaking with the correct person using two identifiers.  Location: Patient: Home Provider: [place] office    I discussed the limitations, risks, security and privacy concerns of performing an evaluation and management service by telephone and the availability of in person appointments. I also discussed with the patient that there may be a patient responsible charge related to this service. The patient expressed understanding and agreed to proceed.       I provided 60 minutes of non-face-to-face time during this encounter.   [name], LCSW             THERAPIST PROGRESS NOTE          Session Time:  Wednesday 07/02/2021 9:10 AM - 10:10 AM   Participation Level: Active  Behavioral Response: AlertAnxious/   Type of Therapy: Individual Therapy  Treatment Goals addressed:  Improve ability to manage event recollections without experiencing intense arousal (nausea, shaking) AEB patient practicing calming and grounding techniques and experiencing a reduction in intensity of episodes (from 8 to a 4 on 10 point scale per patient's report)  Progress on Goals: progressing    Interventions: CBT and Supportive  Summary: Erica Bennett is a 76 y.o. female who  is self -referred and is a returning patient to this clinician. She denies any psychiatric hospitalizations. She continues to see psychiatrist Dr. Harrington Challenger  for medicatiion management.  Patient is resuming services due to grief and loss issues, worry, anxiety, and intrusive recollections of husband's death.  Her husband died by suicide in 05/22/22 of this year.  He shot himself in the head in patient's presence.  Patient is blind and reports she had to touch the entry and exit areas made by the bullet in order to give information to 911 dispatcher when she called for help for husband.     Patient last was seen via virtual visit about 3-4 weeks ago.  She reports experiencing 1 episode of intense arousal since last session. Per her report, this was triggered by a bird hitting her door. This triggered a flashback. Pt rates intensity of episode at an 8 but reports decreased duration as compared to previous episodes. She used self talk and listening to an audio book to cope. She continues to have grief and loss issues regarding her husband's death including self blame along with guilt.  However, she expresses increased acceptance some of her questions about her husband's death may never be answered.  Patient maintains involvement in activities and socialization.  She reports experiencing enjoyment and pleasure in activities.  She reports continued strong support from church family and friends.  Suicidal/Homicidal: Nowithout intent/plan       Therapist Response:, reviewed symptoms, continued to assist patient examine thought patterns regarding the cause of husband's death, assisted patient identify and challenge stuck points related to self blame, encouraged patient to maintain involvement in activities and socialization   Plan return in 2 weeks            Diagnosis: Axis I: Major Depression, single episode    Axis II: No diagnosis  Collaboration of Care: Psychiatrist AEB  patient works with psychiatrist Dr. Harrington Challenger.  , Clinician reviewing chart  Patient/Guardian was advised Release of Information must be obtained prior to any record release in order to collaborate their care with an outside provider. Patient/Guardian was advised if  they have not already done so to contact the registration department to sign all necessary forms in order for [place] to release information regarding their care.   Consent: Patient/Guardian gives verbal consent for treatment and assignment of benefits for services provided during this visit. Patient/Guardian expressed understanding and agreed to proceed.   [name], LCSW 07/02/2021

## 2021-07-16 ENCOUNTER — Encounter (HOSPITAL_COMMUNITY): Payer: Self-pay

## 2021-07-16 ENCOUNTER — Ambulatory Visit (INDEPENDENT_AMBULATORY_CARE_PROVIDER_SITE_OTHER): Payer: Medicare Other | Admitting: Psychiatry

## 2021-07-16 DIAGNOSIS — F321 Major depressive disorder, single episode, moderate: Secondary | ICD-10-CM | POA: Diagnosis not present

## 2021-07-16 NOTE — Plan of Care (Signed)
Pt participated in development of plan Problem: PTSD-Trauma Disorder CCP easily startled, tremors,  Goal: LTG: Reduce frequency, intensity, and duration of PTSD symptoms so daily functioning is improved: AEB reducing intensity and frequency of episodes of  shaking to 1 day per week for 60 days, reduce intensity of startle reactions by 50% per pt's report. Outcome: Not Progressing Goal: STG: [place] WILL PRACTICE EMOTION REGULATION SKILLS 5 PER WEEK FOR THE NEXT  12 WEEKS Outcome: Not Progressing
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
## 2021-07-30 ENCOUNTER — Ambulatory Visit (INDEPENDENT_AMBULATORY_CARE_PROVIDER_SITE_OTHER): Payer: Medicare Other | Admitting: Psychiatry

## 2021-07-30 DIAGNOSIS — F321 Major depressive disorder, single episode, moderate: Secondary | ICD-10-CM | POA: Diagnosis not present
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
## 2021-08-14 ENCOUNTER — Ambulatory Visit (INDEPENDENT_AMBULATORY_CARE_PROVIDER_SITE_OTHER): Payer: Medicare Other | Admitting: Psychiatry

## 2021-08-14 DIAGNOSIS — F321 Major depressive disorder, single episode, moderate: Secondary | ICD-10-CM | POA: Diagnosis not present
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
## 2021-08-15 ENCOUNTER — Telehealth (HOSPITAL_COMMUNITY): Payer: Self-pay

## 2021-08-15 NOTE — Telephone Encounter (Signed)
Pt states that she was in a fog yesterday and wanted to share that it was not Lupus Fog after seeing rheumatologist yesterday and it may be Fibromyalgia Fog.

## 2021-08-15 NOTE — Telephone Encounter (Signed)
Noted  

## 2021-08-28 ENCOUNTER — Ambulatory Visit (INDEPENDENT_AMBULATORY_CARE_PROVIDER_SITE_OTHER): Payer: Medicare Other | Admitting: Psychiatry

## 2021-08-28 DIAGNOSIS — F321 Major depressive disorder, single episode, moderate: Secondary | ICD-10-CM
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
## 2021-09-11 ENCOUNTER — Ambulatory Visit (INDEPENDENT_AMBULATORY_CARE_PROVIDER_SITE_OTHER): Payer: Medicare Other | Admitting: Psychiatry

## 2021-09-11 DIAGNOSIS — F321 Major depressive disorder, single episode, moderate: Secondary | ICD-10-CM

## 2021-09-11 NOTE — Progress Notes (Signed)
Virtual Visit via Telephone Note  I connected with Erica Bennett on 09/11/21 at 9:07 AM  by telephone and verified that I am speaking with the correct person using two identifiers.  Location: Patient: Home Provider: [place] office    I discussed the limitations, risks, security and privacy concerns of performing an evaluation and management service by telephone and the availability of in person appointments. I also discussed with the patient that there may be a patient responsible charge related to this service. The patient expressed understanding and agreed to proceed.   I provided 45 minutes of non-face-to-face time during this encounter.   [name], LCSW              THERAPIST PROGRESS NOTE          Session Time:  Thursday 09/11/2021 9:07 AM -  9:52 AM   Participation Level: Active  Behavioral Response: AlertAnxious/   Type of Therapy: Individual Therapy  Treatment Goals addressed:  Reduce frequency, intensity, and duration of PTSD symptoms so daily functioning is improved: AEB reducing intensity and frequency of episodes of  shaking to 1 day per week for 60 days [place] WILL PRACTICE EMOTION REGULATION SKILLS 5 PER WEEK FOR THE NEXT  12 WEEKS ays, reduce intensity of startle reactions by 50% per pt's report.  Progress on Goals: progressing    Interventions: CBT and Supportive  Summary: Erica Bennett is a 76 y.o. female who  is self -referred and is a returning patient to this clinician. She denies any psychiatric hospitalizations. She continues to see psychiatrist Dr. Harrington Challenger  for medicatiion management.  Patient is resuming services due to grief and loss issues, worry, anxiety, and intrusive recollections of husband's death.  Her husband died by suicide in 24-May-2020. He shot himself in the head in patient's presence.  Patient is blind and reports she had to touch the entry and exit areas made by the bullet in order to give information to 911  dispatcher when she called for help for husband.    Patient last was seen via virtual visit about 2-3 weeks ago.  She reports decreased stress and anxiety.  She also reports decreased intensity and frequency of shaking episodes and states having 3 episodes since last session. She has been practicing body scan meditation and reports positive results at times regarding certain areas of her body. She reports improved sleep pattern. She reports continued behavioral activation and socialization. She reports really enjoying going out of town with friends for the evening this past weekend but expresses guilt about enjoying self.  She reports continued memory difficulty and poor concentration.      Suicidal/Homicidal: Nowithout intent/plan       Therapist Response:, reviewed symptoms, praised and reinforced patient's use of body scan mediation, discussed effects, developed plan with pt to continue practicing, discussed stressors, facilitated expression of thoughts and feelings, validated feelings, assisted patient identify/challenge/and replace statements evoking inappropriate guilt, assisted patient identify compensatory tools and strategies for memory difficulty, also identified ways to use assertiveness skills in requesting help from support system regarding this.  d Plan return in 2 weeks            Diagnosis: Axis I: Major Depression, single episode    Axis II: No diagnosis  Collaboration of Care: Psychiatrist AEB  patient works with psychiatrist Dr. Harrington Challenger.  , Clinician reviewing chart  Patient/Guardian was advised Release of Information must be obtained prior to any record release in order to  collaborate their care with an outside provider. Patient/Guardian was advised if they have not already done so to contact the registration department to sign all necessary forms in order for [place] to release information regarding their care.   Consent: Patient/Guardian gives verbal consent for treatment and  assignment of benefits for services provided during this visit. Patient/Guardian expressed understanding and agreed to proceed.   [name], LCSW 09/11/2021

## 2021-09-25 ENCOUNTER — Ambulatory Visit (INDEPENDENT_AMBULATORY_CARE_PROVIDER_SITE_OTHER): Payer: Medicare Other | Admitting: Psychiatry

## 2021-09-25 DIAGNOSIS — F321 Major depressive disorder, single episode, moderate: Secondary | ICD-10-CM | POA: Diagnosis not present

## 2021-09-25 NOTE — Progress Notes (Signed)
Virtual Visit via Telephone Note  I connected with Erica Bennett on 09/25/21 at 9:10 AM EDT  by telephone and verified that I am speaking with the correct person using two identifiers.  Location: Patient: Home  Provider: [place] office    I discussed the limitations, risks, security and privacy concerns of performing an evaluation and management service by telephone and the availability of in person appointments. I also discussed with the patient that there may be a patient responsible charge related to this service. The patient expressed understanding and agreed to proceed.   I provided 45 minutes of non-face-to-face time during this encounter.   Erica Smoker, LCSW             THERAPIST PROGRESS NOTE          Session Time:  Thursday 09/25/2021 9:10 AM -  9:55 AM   Participation Level: Active  Behavioral Response: AlertAnxious/   Type of Therapy: Individual Therapy  Treatment Goals addressed:  Reduce frequency, intensity, and duration of PTSD symptoms so daily functioning is improved: AEB reducing intensity and frequency of episodes of  shaking to 1 day per week for 60 days [name], reduce intensity of startle reactions by 50% per pt's report.  Progress on Goals: progressing      Interventions: CBT and Supportive  Summary: Erica Bennett is a 76 y.o. female who  is self -referred and is a returning patient to this clinician. She denies any psychiatric hospitalizations. She continues to see psychiatrist Dr. Harrington Bennett  for medicatiion management.  Patient is resuming services due to grief and loss issues, worry, anxiety, and intrusive recollections of husband's death.  Her husband died by suicide in May 11, 2020. He shot himself in the head in patient's presence.  Patient is blind and reports she had to touch the entry and exit areas made by the bullet in order to give information to 911  dispatcher when she called for help for husband.    Patient last was seen via virtual visit about 2-3 weeks ago.  She reports increased startled responses due to hickory nuts falling on her home as this reminds her of gunshots.  However her responses to this are not as intense as the responses were last year.  She reports trying to use distracting activities to cope.  She reports increased restlessness but cannot identify any particular trigger.  She has continued to maintain involvement in activities and socializing with friends.  She reports no longer experiencing guilt about enjoying self.  Patient also is pleased she has improved assertiveness skills in making request to her support system.  She continues to express concern about memory difficulty and poor concentration.  She has what if thoughts about help from her support system should she develop Alzheimer's.   Suicidal/Homicidal: Nowithout intent/plan       Therapist Response:, reviewed symptoms, praised and reinforced patient's efforts to cope with startle response, assisted patient distinguish between real threat and perceived threat, elaborated on the anxiety and stress response/the way it affects the body physically, reviewed rationale for using strategies to release tension from the body, assisted patient practice body scan meditation, developed plan with patient to practice body scan meditation, praised and reinforced patient's continued social involvement and use of assertiveness skills Plan return in 2 weeks            Diagnosis: Axis I: Major Depression, single  episode    Axis II: No diagnosis  Collaboration of Care: Psychiatrist AEB  patient works with psychiatrist Dr. Harrington Bennett.  , Clinician reviewing chart  Patient/Guardian was advised Release of Information must be obtained prior to any record release in order to collaborate their care with an outside provider. Patient/Guardian was advised if they have not already done so to contact the  registration department to sign all necessary forms in order for [place] to release information regarding their care.   Consent: Patient/Guardian gives verbal consent for treatment and assignment of benefits for services provided during this visit. Patient/Guardian expressed understanding and agreed to proceed.   Erica Smoker, LCSW 09/25/2021

## 2021-09-30 ENCOUNTER — Telehealth (INDEPENDENT_AMBULATORY_CARE_PROVIDER_SITE_OTHER): Payer: Medicare Other | Admitting: Psychiatry

## 2021-09-30 ENCOUNTER — Encounter (HOSPITAL_COMMUNITY): Payer: Self-pay | Admitting: Psychiatry

## 2021-09-30 DIAGNOSIS — F321 Major depressive disorder, single episode, moderate: Secondary | ICD-10-CM | POA: Diagnosis not present

## 2021-09-30 MED ORDER — DULOXETINE HCL 60 MG PO CPEP: 60.0000 mg | ORAL_CAPSULE | Freq: Two times a day (BID) | ORAL | 3 refills | Status: DC

## 2021-09-30 MED ORDER — ALPRAZOLAM 1 MG PO TABS
1.0000 mg | ORAL_TABLET | Freq: Two times a day (BID) | ORAL | 2 refills | Status: DC | PRN
Start: 2021-09-30 — End: 2021-12-30

## 2021-09-30 NOTE — Progress Notes (Signed)
Virtual Visit via Telephone Note  I connected with Erica Bennett on 09/30/21 at  8:40 AM EDT by telephone and verified that I am speaking with the correct person using two identifiers.  Location: Patient: home Provider: office   I discussed the limitations, risks, security and privacy concerns of performing an evaluation and management service by telephone and the availability of in person appointments. I also discussed with the patient that there may be a patient responsible charge related to this service. The patient expressed understanding and agreed to proceed.       I discussed the assessment and treatment plan with the patient. The patient was provided an opportunity to ask questions and all were answered. The patient agreed with the plan and demonstrated an understanding of the instructions.   The patient was advised to call back or seek an in-person evaluation if the symptoms worsen or if the condition fails to improve as anticipated.  I provided 15 minutes of non-face-to-face time during this encounter.   [name], MD  [facility] MD/PA/NP OP Progress Note  09/30/2021 9:04 AM Erica Bennett  MRN:  [id number]  Chief Complaint:  Chief Complaint  Patient presents with   Depression   Anxiety   Follow-up   HPI: This patient is a 76 year old widowed white female who lives alone in [place].  She has no children.  She is on disability for [place] blindness.  The patient returns for follow-up after 3 months regarding depression and anxiety.  She states for the most part she has been doing well.  She had a flareup of her joint inflammation.  She has been diagnosed with mixed connective tissue disease.  She went on prednisone for several days and now feels much better.  She denies significant depression or anxiety.  Her sleep is fair although sometimes she wakes up at 4 AM and cannot get back to sleep.  She does not want to take any more medication at night for fear of falling since she  is legally blind.  She is trying to stay positive and states that she has "come to terms" with her husband suicide which happened a year ago April.  She is very supported by church friends. Visit Diagnosis:    ICD-10-CM   1. Moderate single current episode of major depressive disorder (HCC)  F32.1       Past Psychiatric History: Prior outpatient treatment  Past Medical History:  Past Medical History:  Diagnosis Date   Anxiety    Blindness of both eyes    due to degenerative retina and acute glaucoma   Complication of anesthesia    pt has limited mouth opening due to bilateral tmj surgery   Fecal incontinence    Fibromyalgia    Generalized seizure disorder [facility])    neurologist-- dr Jannifer Franklin--  seizure is staring off and dropping things  (12-20-2019  per pt last seizure approx. 2005)   GERD (gastroesophageal reflux disease)    H/O Hashimoto thyroiditis    History of acute angle-closure glaucoma    bilateral eyes  s/p eye removal, now has prosthesis   History of hypertension    History of skin cancer    History of thyroid nodule    bx left thyroid nodule 11-17-2017;  s/p left thyroidectomy 11-07-2018, benign mass   Hyperlipidemia    Limitation of opening of mouth    per pt due to bilateral tmj surgery 1989   MDD (major depressive disorder)    Migraines  Mixed connective tissue disease ([place])    rheumotologist-- dr Amil Amen--  taking plaquenil   OA (osteoarthritis)    OSA (obstructive sleep apnea)    does not use CPAP   Osteoporosis    PONV (postoperative nausea and vomiting)    and hard to wake   Prosthetic eye globe    bilateral   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 ;  s/p enucleation     Past Surgical History:  Procedure Laterality Date   ABDOMINAL HYSTERECTOMY  1980s   AND APPENDECTOMY   ENUCLEATION Right 1984   right eye   EVISCERATION Left 1996   left eye   [place]  ARTHROSCOPY WITH SUBACROMIAL DECOMPRESSION Right 09-15-2007  $[email or web address]'@[id number]$    AND BURSECTOMY/  DEBRIDEMENT ROTATOR CUFF   TEMPOROMANDIBULAR JOINT SURGERY Bilateral 1989   THYROIDECTOMY Left 11/07/2018   Procedure: LEFT HEMI THYROIDECTOMY;  Surgeon: Leta Baptist, MD;  Location: [place];  Service: ENT;  Laterality: Left;   TONSILLECTOMY  child    Family Psychiatric History: See below  Family History:  Family History  Problem Relation Age of Onset   Colon cancer Mother    Parkinson's disease Mother    Depression Maternal Aunt    Colon cancer Maternal Aunt    Depression Paternal Aunt    Alcohol abuse Paternal Uncle    Drug abuse Paternal Uncle    Parkinson's disease Maternal Grandfather    Depression Cousin    Colon cancer Other     Social History:  Social History   Socioeconomic History   Marital status: Married    Spouse name: Not on file   Number of children: 0   Years of education: 18   Highest education level: Not on file  Occupational History   Occupation: Retired  Tobacco Use   Smoking status: Former    Years: 20.00    Types: Cigarettes    Quit date: 01/03/1992    Years since quitting: 29.7   Smokeless tobacco: Never  Vaping Use   Vaping Use: Never used  Substance and Sexual Activity   Alcohol use: No    Alcohol/week: 0.0 standard drinks of alcohol   Drug use: Never   Sexual activity: Yes    Birth control/protection: Surgical  Other Topics Concern   Not on file  Social History Narrative   POA-[place]   Caffeine use: daily (1 soda per day, 1 coffee per day)   Left handed    Legally blind      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 broadcast assistant Strain: Not on file  Food Insecurity: Not on file  Transportation Needs: Not on file  Physical Activity: Not on file  Stress: Not on file  Social Connections: Not on file    Allergies:  Allergies  Allergen Reactions   Bacitracin Rash   Neomycin Rash   Sulfa  Antibiotics Swelling and Rash   Sulfamethoxazole Swelling and Other (See Comments)   Eggs Or Egg-Derived Products Diarrhea and Nausea And Vomiting    Stomach cramps   Elavil [Amitriptyline Hcl] Other (See Comments)    Really bad tremors   Gabapentin Other (See Comments)    Balance issues   Mixed Ragweed     Nasal congestion   Prozac [Fluoxetine Hcl]     Severe headache    Metabolic Disorder Labs: No results found for: "  HGBA1C", "MPG" No results found for: "PROLACTIN" Lab Results  Component Value Date   CHOL 203 (H) 11/18/2017   TRIG 227 (H) 11/18/2017   HDL 51 11/18/2017   CHOLHDL 4.0 11/18/2017   LDLCALC 107 (H) 11/18/2017   Lab Results  Component Value Date   TSH 1.940 03/24/2021   TSH 2.940 03/02/2018    Therapeutic Level Labs: No results found for: "LITHIUM" No results found for: "VALPROATE" Lab Results  Component Value Date   CBMZ 4.0 01/16/2021   CBMZ 6.1 11/14/2019    Current Medications: Current Outpatient Medications  Medication Sig Dispense Refill   ALPRAZolam (XANAX) 1 MG tablet Take 1 tablet (1 mg total) by mouth 2 (two) times daily as needed. for anxiety 60 tablet 2   CARBATROL 200 MG 12 hr capsule Take 1 capsule (200 mg total) by mouth daily. 90 capsule 3   Cholecalciferol (VITAMIN D3 PO) Take 1 tablet by mouth daily.     DEXILANT 60 MG capsule TAKE ONE CAPSULE BY MOUTH DAILY; ON AN EMPTY STOMACH, THEN DO NOT EAT FOR ONE HOUR. (Patient taking differently: Take 60 mg by mouth daily before breakfast.) 30 capsule 5   DULoxetine (CYMBALTA) 60 MG capsule Take 1 capsule (60 mg total) by mouth 2 (two) times daily. 180 capsule 3   HYDROcodone-acetaminophen (NORCO/VICODIN) 5-325 MG tablet Take 1 tablet by mouth every 6 (six) hours as needed for moderate pain.     hydroxychloroquine (PLAQUENIL) 200 MG tablet Take 1 tablet (200 mg total) by mouth 2 (two) times daily. (Patient taking differently: Take 100 mg by mouth 2 (two) times daily. Take four 2 tablets by mouth  with food or milk once daily.) 180 tablet 1   MAXALT 10 MG tablet Take 1 tablet (10 mg total) by mouth as needed. (Patient taking differently: Take 10 mg by mouth 3 (three) times daily as needed for migraine.) 10 tablet 5   metoprolol succinate (TOPROL-XL) 100 MG 24 hr tablet Take 1 tablet (100 mg total) by mouth every evening. (Patient taking differently: Take 100 mg by mouth every evening.) 90 tablet 0   NARCAN 4 MG/0.1ML LIQD nasal spray kit Place 1 spray into the nose as needed (accidental overdose.).     NIACIN PO Take by mouth daily.     ondansetron (ZOFRAN) 4 MG tablet Take 4 mg by mouth every 8 (eight) hours as needed for nausea or vomiting.     VITAMIN D PO Take 1 tablet by mouth daily.     No current facility-administered medications for this visit.     Musculoskeletal: Strength & Muscle Tone: na Gait & Station: na Patient leans: N/A  Psychiatric Specialty Exam: Review of Systems  Constitutional:  Positive for fatigue.  Musculoskeletal:  Positive for arthralgias and myalgias.  Neurological:  Positive for headaches.  All other systems reviewed and are negative.   There were no vitals taken for this visit.There is no height or weight on file to calculate BMI.  General Appearance: NA  Eye Contact:  None  Speech:  NA  Volume:  Normal  Mood:  Euthymic  Affect:  NA  Thought Process:  Goal Directed  Orientation:  Full (Time, Place, and Person)  Thought Content: WDL   Suicidal Thoughts:  No  Homicidal Thoughts:  No  Memory:  Immediate;   Good Recent;   Good Remote;   Good  Judgement:  Good  Insight:  Good  Psychomotor Activity:  Normal  Concentration:  Concentration: Good and Attention Span:  Good  Recall:  Good  Fund of Knowledge: Good  Language: Good  Akathisia:  No  Handed:  Right  AIMS (if indicated): not done  Assets:  Communication Skills Desire for Improvement Resilience Social Support Talents/Skills  ADL's:  Intact  Cognition: WNL  Sleep:  Fair    Screenings: GAD-7    Health and safety inspector from 03/30/2019 in [facility]  Total GAD-7 Score 2      PHQ2-9    Flowsheet Row Video Visit from 09/30/2021 in [place] Counselor from 07/16/2021 in [place] Video Visit from 07/01/2021 in [place] Video Visit from 03/31/2021 in [place] Video Visit from 01/30/2021 in [facility] [facility]  PHQ-2 Total Score 0 1 0 0 0      Flowsheet Row Video Visit from 09/30/2021 in [place] Counselor from 07/16/2021 in [facility] Video Visit from 07/01/2021 in [place] No Risk No Risk No Risk        Assessment and Plan: This patient is a 76 year old female with a history of depression anxiety as well as PTSD relating to her husband suicide.  She feels like she is getting past this and is doing well.  She is no longer using the Mysoline for tremor as it seems to be subsiding.  She will continue Xanax 1 mg twice daily for anxiety as needed and Cymbalta 60 mg twice daily for depression and fibromyalgia.  She will return to see me in 3 months  Collaboration of Care: Collaboration of Care: Referral or follow-up with counselor/therapist AEB patient will continue therapy with Maurice Small in our office  Patient/Guardian was advised Release of Information must be obtained prior to any record release in order to collaborate their care with an outside provider. Patient/Guardian was advised if they have not already done so to contact the registration department to sign all necessary forms in order for [place] to release information regarding their care.   Consent:  Patient/Guardian gives verbal consent for treatment and assignment of benefits for services provided during this visit. Patient/Guardian expressed understanding and agreed to proceed.    [name], MD 09/30/2021, 9:04 AM

## 2021-10-21 ENCOUNTER — Telehealth (HOSPITAL_COMMUNITY): Payer: Self-pay

## 2021-10-21 NOTE — Telephone Encounter (Signed)
Called to schedule 3 x appts with Peggy no answer left vm

## 2021-10-22 NOTE — Telephone Encounter (Signed)
scheduled

## 2021-11-21 ENCOUNTER — Ambulatory Visit (INDEPENDENT_AMBULATORY_CARE_PROVIDER_SITE_OTHER): Payer: Medicare Other | Admitting: Psychiatry

## 2021-11-21 DIAGNOSIS — F321 Major depressive disorder, single episode, moderate: Secondary | ICD-10-CM | POA: Diagnosis not present

## 2021-11-21 NOTE — Progress Notes (Signed)
Virtual Visit via Telephone Note  I connected with Erica Bennett on 11/21/21 at 9:36 AM EDT  by telephone and verified that I am speaking with the correct person using two identifiers.  Location: Patient: Home Provider: [place] office    I discussed the limitations, risks, security and privacy concerns of performing an evaluation and management service by telephone and the availability of in person appointments. I also discussed with the patient that there may be a patient responsible charge related to this service. The patient expressed understanding and agreed to proceed.   I provided 28 minutes of non-face-to-face time during this encounter.   [name], LCSW              THERAPIST PROGRESS NOTE          Session Time:  Friday 11/21/2021 9:36 AM - 10:04 PM   Participation Level: Active  Behavioral Response: AlertAnxious/   Type of Therapy: Individual Therapy  Treatment Goals addressed:  Reduce frequency, intensity, and duration of PTSD symptoms so daily functioning is improved: AEB reducing intensity and frequency of episodes of  shaking to 1 day per week for 60 days [place] WILL PRACTICE EMOTION REGULATION SKILLS 5 PER WEEK FOR THE NEXT  12 WEEKS ays, reduce intensity of startle reactions by 50% per pt's report.  Progress on Goals: progressing      Interventions: CBT and Supportive  Summary: Erica Bennett is a 76 y.o. female who  is self -referred and is a returning patient to this clinician. She denies any psychiatric hospitalizations. She continues to see psychiatrist Dr. Harrington Challenger  for medicatiion management.  Patient is resuming services due to grief and loss issues, worry, anxiety, and intrusive recollections of husband's death.  Her husband died by suicide in 2020-05-16. He shot himself in the head in patient's presence.  Patient is blind and reports she had to touch the entry and exit areas made by the bullet in order to give information to 911  dispatcher when she called for help for husband.    Patient last was seen via virtual visit about 7-8 weeks ago.  She reports decreased intensity and frequency startle responses since last session.  Per patient's report, she has experienced 2-3 instances.  She reports the intensity of the startle response was 25 to 30% out of 100%.  She reports practicing body scan meditation and says this has been helpful.  She reports increased awareness of muscle tension in her body and successfully has been able to relax her muscles/release tension.  She maintains involvement in activity including going to church, participating in a ladies guilt, and going out to with friends.  She reports enjoying these events.  She reports she experienced some sadness and anxiety triggered by TV series as the voices of one of the characters remind her of her husband.  However, she reports she did not dwell on this.      Suicidal/Homicidal: Nowithout intent/plan       Therapist Response:, reviewed symptoms, praised and reinforced patient's efforts to cope with startles response, reviewed the mechanics of the threat response system, developed plan with patient to continue using relaxation techniques including deep breathing and body scan, facilitated patient expressing thoughts and feelings about her husband , validated feelings   plan return in 2 weeks            Diagnosis: Axis I: Major Depression, single episode    Axis II: No diagnosis  Collaboration of Care:  Psychiatrist AEB  patient works with psychiatrist Dr. Harrington Challenger.  , Clinician reviewing chart  Patient/Guardian was advised Release of Information must be obtained prior to any record release in order to collaborate their care with an outside provider. Patient/Guardian was advised if they have not already done so to contact the registration department to sign all necessary forms in order for [place] to release information regarding their care.   Consent: Patient/Guardian gives  verbal consent for treatment and assignment of benefits for services provided during this visit. Patient/Guardian expressed understanding and agreed to proceed.   [name], LCSW 11/21/2021

## 2021-12-05 ENCOUNTER — Ambulatory Visit (INDEPENDENT_AMBULATORY_CARE_PROVIDER_SITE_OTHER): Payer: Medicare Other | Admitting: Psychiatry

## 2021-12-05 DIAGNOSIS — F321 Major depressive disorder, single episode, moderate: Secondary | ICD-10-CM | POA: Diagnosis not present
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
## 2021-12-19 ENCOUNTER — Ambulatory Visit (HOSPITAL_COMMUNITY): Payer: Medicare Other | Admitting: Psychiatry

## 2021-12-25 ENCOUNTER — Other Ambulatory Visit: Payer: Self-pay | Admitting: Neurology

## 2021-12-25 DIAGNOSIS — G40409 Other generalized epilepsy and epileptic syndromes, not intractable, without status epilepticus: Secondary | ICD-10-CM

## 2021-12-30 ENCOUNTER — Telehealth (INDEPENDENT_AMBULATORY_CARE_PROVIDER_SITE_OTHER): Payer: Medicare Other | Admitting: Psychiatry

## 2021-12-30 ENCOUNTER — Encounter (HOSPITAL_COMMUNITY): Payer: Self-pay | Admitting: Psychiatry

## 2021-12-30 DIAGNOSIS — F321 Major depressive disorder, single episode, moderate: Secondary | ICD-10-CM

## 2021-12-30 MED ORDER — ALPRAZOLAM 1 MG PO TABS
1.0000 mg | ORAL_TABLET | Freq: Two times a day (BID) | ORAL | 2 refills | Status: DC | PRN
Start: 2021-12-30 — End: 2022-03-31

## 2021-12-30 MED ORDER — DULOXETINE HCL 60 MG PO CPEP
60.0000 mg | ORAL_CAPSULE | Freq: Two times a day (BID) | ORAL | 3 refills | Status: DC
Start: 2021-12-30 — End: 2022-03-31

## 2021-12-30 NOTE — Progress Notes (Signed)
Virtual Visit via Telephone Note  I connected with Erica Bennett on 12/30/21 at  9:00 AM EST by telephone and verified that I am speaking with the correct person using two identifiers.  Location: Patient: home Provider: office   I discussed the limitations, risks, security and privacy concerns of performing an evaluation and management service by telephone and the availability of in person appointments. I also discussed with the patient that there may be a patient responsible charge related to this service. The patient expressed understanding and agreed to proceed.      I discussed the assessment and treatment plan with the patient. The patient was provided an opportunity to ask questions and all were answered. The patient agreed with the plan and demonstrated an understanding of the instructions.   The patient was advised to call back or seek an in-person evaluation if the symptoms worsen or if the condition fails to improve as anticipated.  I provided 15 minutes of non-face-to-face time during this encounter.   [name], MD  [facility] MD/PA/NP OP Progress Note  12/30/2021 9:20 AM Erica Bennett  MRN:  [id number]  Chief Complaint:  Chief Complaint  Patient presents with   Depression   Anxiety   Follow-up   HPI: This patient is a 76 year old widowed white female who lives alone in [place].  She has no children.  She has on disability for blindness.  The patient returns for follow-up after 3 months regarding her depression and anxiety.  She is not doing as well lately.  She had to have a lot of dental work done which is caused pain.  Also sitting in the dental chair aggravates her connective tissue disease and fibromyalgia.  Her sleep has been poor but she admits that she is cut down the Xanax to 0.5 mg twice daily and it is not working as well.  It is prescribed as 1 mg twice daily and I think this would work better for her.  She is trying to stay positive.  She still enjoys going  to church her out to dinner with friends.  She denies thoughts of self-harm or suicide.  I offered to add something else for sleep but she would rather just try the previous dose of Xanax and I think this is reasonable Visit Diagnosis:    ICD-10-CM   1. Moderate single current episode of major depressive disorder (HCC)  F32.1       Past Psychiatric History: Prior outpatient treatment  Past Medical History:  Past Medical History:  Diagnosis Date   Anxiety    Blindness of both eyes    due to degenerative retina and acute glaucoma   Complication of anesthesia    pt has limited mouth opening due to bilateral tmj surgery   Fecal incontinence    Fibromyalgia    Generalized seizure disorder [facility])    neurologist-- dr Jannifer Franklin--  seizure is staring off and dropping things  (12-20-2019  per pt last seizure approx. 2005)   GERD (gastroesophageal reflux disease)    H/O Hashimoto thyroiditis    History of acute angle-closure glaucoma    bilateral eyes  s/p eye removal, now has prosthesis   History of hypertension    History of skin cancer    History of thyroid nodule    bx left thyroid nodule 11-17-2017;  s/p left thyroidectomy 11-07-2018, benign mass   Hyperlipidemia    Limitation of opening of mouth    per pt due to bilateral tmj surgery 1989  MDD (major depressive disorder)    Migraines    Mixed connective tissue disease ([place])    rheumotologist-- dr Amil Amen--  taking plaquenil   OA (osteoarthritis)    OSA (obstructive sleep apnea)    does not use CPAP   Osteoporosis    PONV (postoperative nausea and vomiting)    and hard to wake   Prosthetic eye globe    bilateral   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 ;  s/p enucleation     Past Surgical History:  Procedure Laterality Date   ABDOMINAL HYSTERECTOMY  1980s   AND APPENDECTOMY   ENUCLEATION Right 1984   right eye   EVISCERATION Left 1996   left eye   [facility] ARTHROSCOPY WITH SUBACROMIAL DECOMPRESSION Right 09-15-2007  _0    AND BURSECTOMY/  DEBRIDEMENT ROTATOR CUFF   TEMPOROMANDIBULAR JOINT SURGERY Bilateral 1989   THYROIDECTOMY Left 11/07/2018   Procedure: LEFT HEMI THYROIDECTOMY;  Surgeon: Leta Baptist, MD;  Location: [place];  Service: ENT;  Laterality: Left;   TONSILLECTOMY  child    Family Psychiatric History: See below  Family History:  Family History  Problem Relation Age of Onset   Colon cancer Mother    Parkinson's disease Mother    Depression Maternal Aunt    Colon cancer Maternal Aunt    Depression Paternal Aunt    Alcohol abuse Paternal Uncle    Drug abuse Paternal Uncle    Parkinson's disease Maternal Grandfather    Depression Cousin    Colon cancer Other     Social History:  Social History   Socioeconomic History   Marital status: Married    Spouse name: Not on file   Number of children: 0   Years of education: 18   Highest education level: Not on file  Occupational History   Occupation: Retired  Tobacco Use   Smoking status: Former    Years: 20.00    Types: Cigarettes    Quit date: 01/03/1992    Years since quitting: 30.0   Smokeless tobacco: Never  Vaping Use   Vaping Use: Never used  Substance and Sexual Activity   Alcohol use: No    Alcohol/week: 0.0 standard drinks of alcohol   Drug use: Never   Sexual activity: Yes    Birth control/protection: Surgical  Other Topics Concern   Not on file  Social History Narrative   POA-[place]   Caffeine use: daily (1 soda per day, 1 coffee per day)   Left handed    Legally blind      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 broadcast assistant Strain: Not on file  Food Insecurity: Not on file  Transportation Needs: Not on file  Physical Activity: Not on file  Stress: Not on file  Social Connections: Not on file    Allergies:  Allergies  Allergen Reactions   Bacitracin  Rash   Neomycin Rash   Sulfa Antibiotics Swelling and Rash   Sulfamethoxazole Swelling and Other (See Comments)   Eggs Or Egg-Derived Products Diarrhea and Nausea And Vomiting    Stomach cramps   Elavil [Amitriptyline Hcl] Other (See Comments)    Really bad tremors   Gabapentin Other (See Comments)    Balance issues   Mixed Ragweed     Nasal congestion   Prozac [Fluoxetine Hcl]     Severe  headache    Metabolic Disorder Labs: No results found for: "HGBA1C", "MPG" No results found for: "PROLACTIN" Lab Results  Component Value Date   CHOL 203 (H) 11/18/2017   TRIG 227 (H) 11/18/2017   HDL 51 11/18/2017   CHOLHDL 4.0 11/18/2017   LDLCALC 107 (H) 11/18/2017   Lab Results  Component Value Date   TSH 1.940 03/24/2021   TSH 2.940 03/02/2018    Therapeutic Level Labs: No results found for: "LITHIUM" No results found for: "VALPROATE" Lab Results  Component Value Date   CBMZ 4.0 01/16/2021   CBMZ 6.1 11/14/2019    Current Medications: Current Outpatient Medications  Medication Sig Dispense Refill   ALPRAZolam (XANAX) 1 MG tablet Take 1 tablet (1 mg total) by mouth 2 (two) times daily as needed. for anxiety 60 tablet 2   CARBATROL 200 MG 12 hr capsule TAKE ONE CAPSULE BY MOUTH ONCE DAILY 90 capsule 3   Cholecalciferol (VITAMIN D3 PO) Take 1 tablet by mouth daily.     DEXILANT 60 MG capsule TAKE ONE CAPSULE BY MOUTH DAILY; ON AN EMPTY STOMACH, THEN DO NOT EAT FOR ONE HOUR. (Patient taking differently: Take 60 mg by mouth daily before breakfast.) 30 capsule 5   DULoxetine (CYMBALTA) 60 MG capsule Take 1 capsule (60 mg total) by mouth 2 (two) times daily. 180 capsule 3   HYDROcodone-acetaminophen (NORCO/VICODIN) 5-325 MG tablet Take 1 tablet by mouth every 6 (six) hours as needed for moderate pain.     hydroxychloroquine (PLAQUENIL) 200 MG tablet Take 1 tablet (200 mg total) by mouth 2 (two) times daily. (Patient taking differently: Take 100 mg by mouth 2 (two) times daily. Take  four 2 tablets by mouth with food or milk once daily.) 180 tablet 1   MAXALT 10 MG tablet Take 1 tablet (10 mg total) by mouth as needed. (Patient taking differently: Take 10 mg by mouth 3 (three) times daily as needed for migraine.) 10 tablet 5   metoprolol succinate (TOPROL-XL) 100 MG 24 hr tablet Take 1 tablet (100 mg total) by mouth every evening. (Patient taking differently: Take 100 mg by mouth every evening.) 90 tablet 0   NARCAN 4 MG/0.1ML LIQD nasal spray kit Place 1 spray into the nose as needed (accidental overdose.).     NIACIN PO Take by mouth daily.     ondansetron (ZOFRAN) 4 MG tablet Take 4 mg by mouth every 8 (eight) hours as needed for nausea or vomiting.     VITAMIN D PO Take 1 tablet by mouth daily.     No current facility-administered medications for this visit.     Musculoskeletal: Strength & Muscle Tone: na Gait & Station: na Patient leans: N/A  Psychiatric Specialty Exam: Review of Systems  Constitutional:  Positive for fatigue.  Eyes:  Positive for visual disturbance.  Musculoskeletal:  Positive for arthralgias and myalgias.  Psychiatric/Behavioral:  Positive for sleep disturbance.   All other systems reviewed and are negative.   There were no vitals taken for this visit.There is no height or weight on file to calculate BMI.  General Appearance: NA  Eye Contact:  NA  Speech:  Clear and Coherent  Volume:  Normal  Mood:  Euthymic  Affect:  NA  Thought Process:  Goal Directed  Orientation:  Full (Time, Place, and Person)  Thought Content: WDL   Suicidal Thoughts:  No  Homicidal Thoughts:  No  Memory:  Immediate;   Good Recent;   Good Remote;   Good  Judgement:  Good  Insight:  Good  Psychomotor Activity:  Decreased also tremors at times when anxious  Concentration:  Concentration: Good and Attention Span: Good  Recall:  Good  Fund of Knowledge: Good  Language: Good  Akathisia:  No  Handed:  Right  AIMS (if indicated): not done  Assets:   Communication Skills Desire for Improvement Resilience Social Support  ADL's:  Intact  Cognition: WNL  Sleep:  Poor   Screenings: GAD-7    Flowsheet Row Counselor from 03/30/2019 in [facility] [place]  Total GAD-7 Score 2      PHQ2-9    Flowsheet Row Video Visit from 09/30/2021 in [place] Counselor from 07/16/2021 in [place] Video Visit from 07/01/2021 in [place] Video Visit from 03/31/2021 in [place] Video Visit from 01/30/2021 in [place] [facility]  PHQ-2 Total Score 0 1 0 0 0      Flowsheet Row Video Visit from 09/30/2021 in [place] Counselor from 07/16/2021 in [facility] Video Visit from 07/01/2021 in [place] No Risk No Risk No Risk        Assessment and Plan: This patient is a 76 year old female with a history of depression anxiety as well as PTSD related to her husband suicide.  She has cut down the Xanax so I suggested she go back up to Xanax 1 mg twice daily as she was sleeping better and had less shaking.  She will continue Cymbalta 60 mg twice daily for depression and fibromyalgia.  She will return to see me in 3 months  Collaboration of Care: Collaboration of Care: Referral or follow-up with counselor/therapist AEB patient will continue therapy with Maurice Small in our office  Patient/Guardian was advised Release of Information must be obtained prior to any record release in order to collaborate their care with an outside provider. Patient/Guardian was advised if they have not already done so to contact the registration department to sign all necessary forms in order for [place] to  release information regarding their care.   Consent: Patient/Guardian gives verbal consent for treatment and assignment of benefits for services provided during this visit. Patient/Guardian expressed understanding and agreed to proceed.    [name], MD 12/30/2021, 9:20 AM

## 2022-01-09 ENCOUNTER — Ambulatory Visit (INDEPENDENT_AMBULATORY_CARE_PROVIDER_SITE_OTHER): Payer: Medicare Other | Admitting: Psychiatry

## 2022-01-09 DIAGNOSIS — F321 Major depressive disorder, single episode, moderate: Secondary | ICD-10-CM
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
## 2022-01-27 ENCOUNTER — Ambulatory Visit (INDEPENDENT_AMBULATORY_CARE_PROVIDER_SITE_OTHER): Payer: Medicare Other | Admitting: Psychiatry

## 2022-01-27 DIAGNOSIS — F321 Major depressive disorder, single episode, moderate: Secondary | ICD-10-CM | POA: Diagnosis not present
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
## 2022-02-23 ENCOUNTER — Ambulatory Visit (INDEPENDENT_AMBULATORY_CARE_PROVIDER_SITE_OTHER): Payer: Medicare Other | Admitting: Psychiatry

## 2022-02-23 DIAGNOSIS — F321 Major depressive disorder, single episode, moderate: Secondary | ICD-10-CM

## 2022-02-23 DIAGNOSIS — F329 Major depressive disorder, single episode, unspecified: Secondary | ICD-10-CM | POA: Diagnosis not present

## 2022-02-23 NOTE — Progress Notes (Signed)
Virtual Visit via Telephone Note  I connected with Erica Bennett on 02/23/22 at  9:00 AM EST by telephone and verified that I am speaking with the correct person using two identifiers.  Location: Patient: Home Provider: [place] office    I discussed the limitations, risks, security and privacy concerns of performing an evaluation and management service by telephone and the availability of in person appointments. I also discussed with the patient that there may be a patient responsible charge related to this service. The patient expressed understanding and agreed to proceed.    I provided 50 minutes of non-face-to-face time during this encounter.   [name], LCSW              THERAPIST PROGRESS NOTE          Session Time:  Monday 02/23/2022 9:00 AM - 9:50 AM   Participation Level: Active  Behavioral Response: AlertAnxious/   Type of Therapy: Individual Therapy  Treatment Goals addressed:  Reduce frequency, intensity, and duration of PTSD symptoms so daily functioning is improved: AEB reducing intensity and frequency of episodes of  shaking to 1 day per week for 60 days [place] WILL PRACTICE EMOTION REGULATION SKILLS 5 PER WEEK FOR THE NEXT  12 WEEKS ays, reduce intensity of startle reactions by 50% per pt's report.  Progress on Goals: progressing       Interventions: CBT and Supportive  Summary: Erica Bennett is a 77 y.o. female who  is self -referred and is a returning patient to this clinician. She denies any psychiatric hospitalizations. She continues to see psychiatrist Dr. Harrington Challenger  for medicatiion management.  Patient is resuming services due to grief and loss issues, worry, anxiety, and intrusive recollections of husband's death.  Her husband died by suicide in 05/31/2020. He shot himself in the head in patient's presence.  Patient is blind and reports she had to touch the entry and exit areas made by the bullet in order to give information to 911  dispatcher when she called for help for husband.    Patient last was seen via virtual visit about 3-4 weeks ago.  She reports feeling better since last session.  Per her report, she had an injection at her rheumatologist's office last week.  She reports experiencing improved concentration, increased energy, and improved mood.  She has maintained involvement in activities and reports resuming attendance at the [place] group.  She reports she has not experienced any triggering events regarding startled response since last session.  However, patient reports remaining jumpy and fears more information today regarding flashbacks she has experienced.  She is pleased she no longer is triggered when she hears a siren.  However, she still easily becomes startled when she hears sudden loud noises.    Suicidal/Homicidal: Nowithout intent/plan       Therapist Response:, reviewed symptoms, praised and reinforced patient's continued involvement in activities, discussed effects, reviewed treatment plan, obtained patient's permission to electronically sign review as this was a virtual visit, discussed next steps for treatment to include reducing muscle tension when triggered and decreasing intensity/duration of flashbacks   plan return in 2 weeks            Diagnosis: Axis I: Major Depression, single episode    Axis II: No diagnosis  Collaboration of Care: Psychiatrist AEB  patient works with psychiatrist Dr. Harrington Challenger.  , Clinician reviewing chart  Patient/Guardian was advised Release of Information must be obtained prior to any record release  in order to collaborate their care with an outside provider. Patient/Guardian was advised if they have not already done so to contact the registration department to sign all necessary forms in order for [place] to release information regarding their care.   Consent: Patient/Guardian gives verbal consent for treatment and assignment of benefits for services provided during this visit.  Patient/Guardian expressed understanding and agreed to proceed.   [name], LCSW 02/23/2022

## 2022-03-09 ENCOUNTER — Ambulatory Visit (HOSPITAL_COMMUNITY): Payer: Medicare Other | Admitting: Psychiatry

## 2022-03-31 ENCOUNTER — Telehealth (INDEPENDENT_AMBULATORY_CARE_PROVIDER_SITE_OTHER): Payer: Medicare Other | Admitting: Psychiatry

## 2022-03-31 ENCOUNTER — Encounter (HOSPITAL_COMMUNITY): Payer: Self-pay | Admitting: Psychiatry

## 2022-03-31 DIAGNOSIS — F4310 Post-traumatic stress disorder, unspecified: Secondary | ICD-10-CM

## 2022-03-31 DIAGNOSIS — F321 Major depressive disorder, single episode, moderate: Secondary | ICD-10-CM

## 2022-03-31 MED ORDER — DULOXETINE HCL 60 MG PO CPEP: 60.0000 mg | ORAL_CAPSULE | Freq: Two times a day (BID) | ORAL | 3 refills | Status: DC

## 2022-03-31 MED ORDER — ALPRAZOLAM 1 MG PO TABS: 1.0000 mg | ORAL_TABLET | Freq: Two times a day (BID) | ORAL | 2 refills | Status: DC | PRN

## 2022-03-31 MED ORDER — PRIMIDONE 50 MG PO TABS: 50.0000 mg | ORAL_TABLET | Freq: Every day | ORAL | 2 refills | Status: DC

## 2022-03-31 NOTE — Progress Notes (Signed)
Virtual Visit via Telephone Note  I connected with Erica Bennett on 03/31/22 at  9:40 AM EST by telephone and verified that I am speaking with the correct person using two identifiers.  Location: Patient: home Provider: office   I discussed the limitations, risks, security and privacy concerns of performing an evaluation and management service by telephone and the availability of in person appointments. I also discussed with the patient that there may be a patient responsible charge related to this service. The patient expressed understanding and agreed to proceed.     I discussed the assessment and treatment plan with the patient. The patient was provided an opportunity to ask questions and all were answered. The patient agreed with the plan and demonstrated an understanding of the instructions.   The patient was advised to call back or seek an in-person evaluation if the symptoms worsen or if the condition fails to improve as anticipated.  I provided 20 minutes of non-face-to-face time during this encounter.   Erica Spiller, MD  [facility] MD/PA/NP OP Progress Note  03/31/2022 10:01 AM Erica Bennett  MRN:  [id number]  Chief Complaint:  Chief Complaint  Patient presents with   Depression   Anxiety   Follow-up   HPI: This patient is a 77 year old widowed white female who lives alone in [place]. She has no children. She has on disability for blindness.  The patient returns for follow-up after 3 months regarding her depression anxiety and PTSD.  Overall she has been fairly stable.  Her connective tissue disease symptoms are stable although she still has problems with fibromyalgia and "brain fog."  She received something called a "Myers cocktail" which is a combination of a bunch of vitamins and it seemed to help the brain fog as does oral B12.  She is still having shakiness and only takes the primidone as needed I urged her to take it every night at least for a while that keep the shakiness  that day.  The neurologist determined that was primarily from anxiety.  Her mood seems to be good and she is getting out with friends and doing things and sleeping well and eating well.  She denies any thoughts of suicide or self-harm.  Visit Diagnosis:    ICD-10-CM   1. Moderate single current episode of major depressive disorder ([place])  F32.1     2. PTSD (post-traumatic stress disorder)  F43.10       Past Psychiatric History: Prior outpatient treatment  Past Medical History:  Past Medical History:  Diagnosis Date   Anxiety    Blindness of both eyes    due to degenerative retina and acute glaucoma   Complication of anesthesia    pt has limited mouth opening due to bilateral tmj surgery   Fecal incontinence    Fibromyalgia    Generalized seizure disorder [facility])    neurologist-- dr Jannifer Franklin--  seizure is staring off and dropping things  (12-20-2019  per pt last seizure approx. 2005)   GERD (gastroesophageal reflux disease)    H/O Hashimoto thyroiditis    History of acute angle-closure glaucoma    bilateral eyes  s/p eye removal, now has prosthesis   History of hypertension    History of skin cancer    History of thyroid nodule    bx left thyroid nodule 11-17-2017;  s/p left thyroidectomy 11-07-2018, benign mass   Hyperlipidemia    Limitation of opening of mouth    per pt due to bilateral tmj surgery 1989  MDD (major depressive disorder)    Migraines    Mixed connective tissue disease ([place])    rheumotologist-- dr Amil Amen--  taking plaquenil   OA (osteoarthritis)    OSA (obstructive sleep apnea)    does not use CPAP   Osteoporosis    PONV (postoperative nausea and vomiting)    and hard to wake   Prosthetic eye globe    bilateral   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 ;  s/p enucleation     Past Surgical History:  Procedure Laterality Date   ABDOMINAL HYSTERECTOMY  1980s   AND APPENDECTOMY   ENUCLEATION Right 1984    right eye   EVISCERATION Left 1996   left eye   [place] ARTHROSCOPY WITH SUBACROMIAL DECOMPRESSION Right 09-15-2007  '@[facility]'$    AND BURSECTOMY/  DEBRIDEMENT ROTATOR CUFF   TEMPOROMANDIBULAR JOINT SURGERY Bilateral 1989   THYROIDECTOMY Left 11/07/2018   Procedure: LEFT HEMI THYROIDECTOMY;  Surgeon: Leta Baptist, MD;  Location: [place];  Service: ENT;  Laterality: Left;   TONSILLECTOMY  child    Family Psychiatric History: See below  Family History:  Family History  Problem Relation Age of Onset   Colon cancer Mother    Parkinson's disease Mother    Depression Maternal Aunt    Colon cancer Maternal Aunt    Depression Paternal Aunt    Alcohol abuse Paternal Uncle    Drug abuse Paternal Uncle    Parkinson's disease Maternal Grandfather    Depression Cousin    Colon cancer Other     Social History:  Social History   Socioeconomic History   Marital status: Married    Spouse name: Not on file   Number of children: 0   Years of education: 18   Highest education level: Not on file  Occupational History   Occupation: Retired  Tobacco Use   Smoking status: Former    Years: 20.00    Types: Cigarettes    Quit date: 01/03/1992    Years since quitting: 30.2   Smokeless tobacco: Never  Vaping Use   Vaping Use: Never used  Substance and Sexual Activity   Alcohol use: No    Alcohol/week: 0.0 standard drinks of alcohol   Drug use: Never   Sexual activity: Yes    Birth control/protection: Surgical  Other Topics Concern   Not on file  Social History Narrative   POA-[place]   Caffeine use: daily (1 soda per day, 1 coffee per day)   Left handed    Legally blind      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 broadcast assistant Strain: Not on file  Food Insecurity: Not on file  Transportation Needs: Not on file  Physical Activity: Not on file  Stress: Not on file  Social Connections: Not on file     Allergies:  Allergies  Allergen Reactions   Bacitracin Rash   Neomycin Rash   Sulfa Antibiotics Swelling and Rash   Sulfamethoxazole Swelling and Other (See Comments)   Eggs Or Egg-Derived Products Diarrhea and Nausea And Vomiting    Stomach cramps   Elavil [Amitriptyline Hcl] Other (See Comments)    Really bad tremors   Gabapentin Other (See Comments)    Balance issues   Mixed Ragweed     Nasal congestion   Prozac [Fluoxetine Hcl]     Severe  headache    Metabolic Disorder Labs: No results found for: "HGBA1C", "MPG" No results found for: "PROLACTIN" Lab Results  Component Value Date   CHOL 203 (H) 11/18/2017   TRIG 227 (H) 11/18/2017   HDL 51 11/18/2017   CHOLHDL 4.0 11/18/2017   LDLCALC 107 (H) 11/18/2017   Lab Results  Component Value Date   TSH 1.940 03/24/2021   TSH 2.940 03/02/2018    Therapeutic Level Labs: No results found for: "LITHIUM" No results found for: "VALPROATE" Lab Results  Component Value Date   CBMZ 4.0 01/16/2021   CBMZ 6.1 11/14/2019    Current Medications: Current Outpatient Medications  Medication Sig Dispense Refill   ALPRAZolam (XANAX) 1 MG tablet Take 1 tablet (1 mg total) by mouth 2 (two) times daily as needed. for anxiety 60 tablet 2   CARBATROL 200 MG 12 hr capsule TAKE ONE CAPSULE BY MOUTH ONCE DAILY 90 capsule 3   Cholecalciferol (VITAMIN D3 PO) Take 1 tablet by mouth daily.     DEXILANT 60 MG capsule TAKE ONE CAPSULE BY MOUTH DAILY; ON AN EMPTY STOMACH, THEN DO NOT EAT FOR ONE HOUR. (Patient taking differently: Take 60 mg by mouth daily before breakfast.) 30 capsule 5   DULoxetine (CYMBALTA) 60 MG capsule Take 1 capsule (60 mg total) by mouth 2 (two) times daily. 180 capsule 3   HYDROcodone-acetaminophen (NORCO/VICODIN) 5-325 MG tablet Take 1 tablet by mouth every 6 (six) hours as needed for moderate pain.     hydroxychloroquine (PLAQUENIL) 200 MG tablet Take 1 tablet (200 mg total) by mouth 2 (two) times daily. (Patient  taking differently: Take 100 mg by mouth 2 (two) times daily. Take four 2 tablets by mouth with food or milk once daily.) 180 tablet 1   MAXALT 10 MG tablet Take 1 tablet (10 mg total) by mouth as needed. (Patient taking differently: Take 10 mg by mouth 3 (three) times daily as needed for migraine.) 10 tablet 5   metoprolol succinate (TOPROL-XL) 100 MG 24 hr tablet Take 1 tablet (100 mg total) by mouth every evening. (Patient taking differently: Take 100 mg by mouth every evening.) 90 tablet 0   NARCAN 4 MG/0.1ML LIQD nasal spray kit Place 1 spray into the nose as needed (accidental overdose.).     NIACIN PO Take by mouth daily.     ondansetron (ZOFRAN) 4 MG tablet Take 4 mg by mouth every 8 (eight) hours as needed for nausea or vomiting.     primidone (MYSOLINE) 50 MG tablet Take 1 tablet (50 mg total) by mouth at bedtime. 30 tablet 2   VITAMIN D PO Take 1 tablet by mouth daily.     No current facility-administered medications for this visit.     Musculoskeletal: Strength & Muscle Tone: na Gait & Station: na Patient leans: N/A  Psychiatric Specialty Exam: Review of Systems  Constitutional:  Positive for fatigue.  Musculoskeletal:  Positive for arthralgias and myalgias.  Neurological:  Positive for tremors.  All other systems reviewed and are negative.   There were no vitals taken for this visit.There is no height or weight on file to calculate BMI.  General Appearance: NA  Eye Contact:  NA  Speech:  Clear and Coherent  Volume:  Normal  Mood:  Euthymic  Affect:  Congruent  Thought Process:  Goal Directed  Orientation:  Full (Time, Place, and Person)  Thought Content: Rumination   Suicidal Thoughts:  No  Homicidal Thoughts:  No  Memory:  Immediate;  Good Recent;   Good Remote;   Good  Judgement:  Good  Insight:  Good  Psychomotor Activity:  Tremor  Concentration:  Concentration: Good and Attention Span: Good  Recall:  Good  Fund of Knowledge: Good  Language: Good   Akathisia:  No  Handed:  Right  AIMS (if indicated): not done  Assets:  Communication Skills Desire for Improvement Resilience Social Support Talents/Skills  ADL's:  Intact  Cognition: WNL  Sleep:  Good   Screenings: GAD-7    Health and safety inspector from 03/30/2019 in [facility] at [place]  Total GAD-7 Score 2      PHQ2-9    Flowsheet Row Video Visit from 03/31/2022 in [facility] at [place] Video Visit from 09/30/2021 in [facility] at [place] from 07/16/2021 in [facility] at [place] Video Visit from 07/01/2021 in [facility] at [place] Video Visit from 03/31/2021 in [facility] at [facility] Total Score 0 0 1 0 0      Flowsheet Row Video Visit from 03/31/2022 in [facility] at [place] Video Visit from 09/30/2021 in [facility] at [place] from 07/16/2021 in [facility] at [place] No Risk No Risk No Risk        Assessment and Plan: This patient is a 77 year old female with a history of depression anxiety and PTSD related to her husband's suicide by gunshot.  Overall she is doing well although she still has some shakiness so we will add that the Mysoline 50 mg at bedtime.  She will continue Xanax 1 mg twice daily for anxiety and Cymbalta 60 mg twice daily for depression and fibromyalgia.  She will return to see me in 3 months  Collaboration of Care: Collaboration of Care: Referral or follow-up with counselor/therapist AEB patient will continue therapy with Maurice Small in our office  Patient/Guardian was advised Release of Information must be obtained prior to any record release in order to collaborate their care with an outside provider. Patient/Guardian was  advised if they have not already done so to contact the registration department to sign all necessary forms in order for [place] to release information regarding their care.   Consent: Patient/Guardian gives verbal consent for treatment and assignment of benefits for services provided during this visit. Patient/Guardian expressed understanding and agreed to proceed.    Erica Spiller, MD 03/31/2022, 10:01 AM

## 2022-04-10 ENCOUNTER — Ambulatory Visit (INDEPENDENT_AMBULATORY_CARE_PROVIDER_SITE_OTHER): Payer: Medicare Other | Admitting: Psychiatry

## 2022-04-10 DIAGNOSIS — F4310 Post-traumatic stress disorder, unspecified: Secondary | ICD-10-CM | POA: Diagnosis not present

## 2022-04-10 DIAGNOSIS — F321 Major depressive disorder, single episode, moderate: Secondary | ICD-10-CM

## 2022-04-10 NOTE — Progress Notes (Signed)
Virtual Visit via Telephone Note  I connected with Erica Bennett on 04/10/22 at 9:04 AM EDT  by telephone and verified that I am speaking with the correct person using two identifiers.  Location: Patient: Home Provider: [place] office    I discussed the limitations, risks, security and privacy concerns of performing an evaluation and management service by telephone and the availability of in person appointments. I also discussed with the patient that there may be a patient responsible charge related to this service. The patient expressed understanding and agreed to proceed.   I provided 52 minutes of non-face-to-face time during this encounter.   [name], LCSW               THERAPIST PROGRESS NOTE          Session Time:  Friday  04/10/2022 9:05 AM - 9:57 AM   Participation Level: Active  Behavioral Response: AlertAnxious/   Type of Therapy: Individual Therapy  Treatment Goals addressed:  Reduce frequency, intensity, and duration of PTSD symptoms so daily functioning is improved: AEB reducing intensity and frequency of episodes of  shaking to 1 day per week for 60 days [place] WILL PRACTICE EMOTION REGULATION SKILLS 5 PER WEEK FOR THE NEXT  12 WEEKS ays, reduce intensity of startle reactions by 50% per pt's report.  Progress on Goals: progressing       Interventions: CBT and Supportive  Summary: Erica Bennett is a 77 y.o. female who  is self -referred and is a returning patient to this clinician. She denies any psychiatric hospitalizations. She continues to see psychiatrist Dr. Harrington Challenger  for medicatiion management.  Patient is resuming services due to grief and loss issues, worry, anxiety, and intrusive recollections of husband's death.  Her husband died by suicide in 05/18/20. He shot himself in the head in patient's presence.  Patient is blind and reports she had to touch the entry and exit areas made by the bullet in order to give information to  911 dispatcher when she called for help for husband.    Patient last was seen via virtual visit about 5-6 weeks ago.   She reports increased symptoms of PTSD as she experienced a triggering event about a week and a half ago.  Per patient's report, she was at the doctor's office in the waiting room when she became startled by the door slamming.  She reports this was an exceptionally loud noise and the door needed to be repaired.  She reports the door was slammed multiple times while she was in the waiting room.  She could still hear the door being slammed when she eventually was placed in a waiting room.  Patient reports being startled each time, experiencing elevated blood pressure, heavy breathing, and difficulty thinking clearly.  She also reports experiencing flashbacks of husband's suicide while in the examination room.  Patient reports nothing she did seem to help calm her down.  She also remained jumpy, nervous, and experienced increased sleep difficulty for about a week.  She reports increased avoidant behavior during that time.  She reports she eventually start walking on the treadmill due to nervous energy and says this was helpful.     Suicidal/Homicidal: Nowithout intent/plan       Therapist Response:, reviewed symptoms, discussed triggers of increased symptoms of PTSD, assisted patient identify her body's response, reviewed the mechanics of the threat/response system, assisted patient distinguish between perceived threat and real threat, praised and reinforced patient's efforts  to use physical activity, reviewed rationale for practicing body scan and grounding techniques to create relaxed muscle body, developed plan with patient to practice body scan daily  plan return in 2 weeks                   Diagnosis: Axis I: Major Depression, single episode    Axis II: No diagnosis  Collaboration of Care: Psychiatrist AEB  patient works with psychiatrist Dr. Harrington Challenger.  , Clinician reviewing  chart  Patient/Guardian was advised Release of Information must be obtained prior to any record release in order to collaborate their care with an outside provider. Patient/Guardian was advised if they have not already done so to contact the registration department to sign all necessary forms in order for [place] to release information regarding their care.   Consent: Patient/Guardian gives verbal consent for treatment and assignment of benefits for services provided during this visit. Patient/Guardian expressed understanding and agreed to proceed.   [name], LCSW 04/10/2022

## 2022-04-24 ENCOUNTER — Ambulatory Visit (INDEPENDENT_AMBULATORY_CARE_PROVIDER_SITE_OTHER): Payer: Medicare Other | Admitting: Psychiatry

## 2022-04-24 DIAGNOSIS — F4310 Post-traumatic stress disorder, unspecified: Secondary | ICD-10-CM

## 2022-04-24 DIAGNOSIS — F321 Major depressive disorder, single episode, moderate: Secondary | ICD-10-CM

## 2022-04-24 NOTE — Progress Notes (Signed)
Virtual Visit via Telephone Note  I connected with [name] on 04/24/22 at  9:00 AM EDT by telephone and verified that I am speaking with the correct person using two identifiers.  Location: Patient: Home Provider: [place] office    I discussed the limitations, risks, security and privacy concerns of performing an evaluation and management service by telephone and the availability of in person appointments. I also discussed with the patient that there may be a patient responsible charge related to this service. The patient expressed understanding and agreed to proceed.    I provided 50 minutes of non-face-to-face time during this encounter.   [name], LCSW                 THERAPIST PROGRESS NOTE          Session Time:  Friday  04/24/2022 9:10 AM -  10:00 AM   Participation Level: Active  Behavioral Response: AlertAnxious/   Type of Therapy: Individual Therapy  Treatment Goals addressed:  Reduce frequency, intensity, and duration of PTSD symptoms so daily functioning is improved: AEB reducing intensity and frequency of episodes of  shaking to 1 day per week for 60 days Dawnielle WILL PRACTICE EMOTION REGULATION SKILLS 5 PER WEEK FOR THE NEXT  12 WEEKS ays, reduce intensity of startle reactions by 50% per pt's report.  Progress on Goals: progressing       Interventions: CBT and Supportive  Summary: RMONI MCLAUCHLIN is a 77 y.o. female who  is self -referred and is a returning patient to this clinician. She denies any psychiatric hospitalizations. She continues to see psychiatrist Dr. Harrington Challenger  for medicatiion management.  Patient is resuming services due to grief and loss issues, worry, anxiety, and intrusive recollections of husband's death.  Her husband died by suicide in 2020-05-25. He shot himself in the head in patient's presence.  Patient is blind and reports she had to touch the entry and exit areas made by the bullet in order to give  information to 911 dispatcher when she called for help for husband.    Patient last was seen via virtual visit about 2 weeks ago.   She reports decreased symptoms of PTSD since last session.  She reports having 1 episode of reexperiencing triggered by a nightmare.  Patient reports successfully using grounding techniques to manage.  She reports she has been practicing body scan meditation regularly and this has been helpful.  Patient continues to have strong support from her church community and friends.  She reports increased memories and painful emotions regarding husband triggered by his 77th birthday on March [birth year] and the upcoming anniversary of his death on 06/16/2022.  Patient verbalizes thoughts of being a failure as a wife since her husband died by suicide.     Suicidal/Homicidal: Nowithout intent/plan       Therapist Response:, reviewed symptoms, praised and reinforced patient's use of grounding techniques/relaxation techniques, discussed effects, facilitated patient expressing thoughts and feelings for regarding husband, validated and normalized feelings related to grief and loss, assisted patient identify/challenge/and replace thoughts regarding being a failure with more rational thoughts to dispel inappropriate guilt and self blame.  plan return in 2 weeks                   Diagnosis: Axis I: Major Depression, single episode    Axis II: No diagnosis  Collaboration of Care: Psychiatrist AEB  patient works with psychiatrist Dr. Harrington Challenger.  , Clinician  reviewing chart  Patient/Guardian was advised Release of Information must be obtained prior to any record release in order to collaborate their care with an outside provider. Patient/Guardian was advised if they have not already done so to contact the registration department to sign all necessary forms in order for [place] to release information regarding their care.   Consent: Patient/Guardian gives verbal consent for treatment and assignment of benefits  for services provided during this visit. Patient/Guardian expressed understanding and agreed to proceed.   [name], LCSW 04/24/2022

## 2022-05-08 ENCOUNTER — Ambulatory Visit (INDEPENDENT_AMBULATORY_CARE_PROVIDER_SITE_OTHER): Payer: Medicare Other | Admitting: Psychiatry

## 2022-05-08 DIAGNOSIS — F321 Major depressive disorder, single episode, moderate: Secondary | ICD-10-CM

## 2022-05-08 DIAGNOSIS — F329 Major depressive disorder, single episode, unspecified: Secondary | ICD-10-CM

## 2022-05-08 DIAGNOSIS — F4310 Post-traumatic stress disorder, unspecified: Secondary | ICD-10-CM

## 2022-05-08 NOTE — Progress Notes (Signed)
Virtual Visit via Telephone Note  I connected with Erica Bennett on 05/08/22 at 9:08 AM EDT  by telephone and verified that I am speaking with the correct person using two identifiers.  Location: Patient: Home Provider: [facility] [place] office    I discussed the limitations, risks, security and privacy concerns of performing an evaluation and management service by telephone and the availability of in person appointments. I also discussed with the patient that there may be a patient responsible charge related to this service. The patient expressed understanding and agreed to proceed.   I provided 48 minutes of non-face-to-face time during this encounter.   [name], LCSW                  THERAPIST PROGRESS NOTE          Session Time:  Friday  05/08/2022 9:08 AM - 9:56 AM   Participation Level: Active  Behavioral Response: Alert, euthymic  Type of Therapy: Individual Therapy  Treatment Goals addressed:  Reduce frequency, intensity, and duration of PTSD symptoms so daily functioning is improved: AEB reducing intensity and frequency of episodes of  shaking to 1 day per week for 60 days Erica Bennett WILL PRACTICE EMOTION REGULATION SKILLS 5 PER WEEK FOR THE NEXT  12 WEEKS ays, reduce intensity of startle reactions by 50% per pt's report.  Progress on Goals: progressing       Interventions: CBT and Supportive  Summary: Erica Bennett is a 77 y.o. female who  is self -referred and is a returning patient to this clinician. She denies any psychiatric hospitalizations. She continues to see psychiatrist Dr. Tenny Craw  for medicatiion management.  Patient is resuming services due to grief and loss issues, worry, anxiety, and intrusive recollections of husband's death.  Her husband died by suicide in 2020-06-13. He shot himself in the head in patient's presence.  Patient is blind and reports she had to touch the entry and exit areas made by the bullet in order to give  information to 911 dispatcher when she called for help for husband.    Patient last was seen via virtual visit about 2-3 weeks ago.   She reports continued decreased symptoms of PTSD since last session.  She reports having 1 episode of reexperiencing triggered by a loud noise while in waiting room at the doctor's office. The noise was caused by a door slamming.  This had occurred in the past at the same doctor's office.  Patient reports managing this better this time by releasing muscle tension and using self-talk.  Although she still experienced a startle response, it was less in intensity and duration.  Patient reports feeling much better overall as she has has some type of medical cocktail provided by her doctor.  Patient reports increased energy and no longer experiencing brain fog.  She reports being very productive and able to accomplish several task throughout her day.  She also reports recently using half of a 5-hour energy drink and says this has been helpful.  She reports no longer having thoughts of being a failure as a wife.  She is planning to attend mass and out of her husband on the anniversary of his death on 06-22-2022.  She also plans to see if she can find someone to take her to her husband's grave.     Suicidal/Homicidal: Nowithout intent/plan       Therapist Response:, reviewed symptoms, praised and reinforced patient's use of grounding techniques/relaxation techniques to  cope with episode of reexperiencing, discussed effects, facilitated patient expressing thoughts and feelings for regarding husband, validated and normalized feelings related to grief and loss, praised and reinforced patient's plans regarding coping with the anniversary of her husband's death, encouraged patient to continue using coping statements to come back negative thoughts about self should they recur, facilitated patient sharing positive memories about husband, talked with patient about discussing use of 5-hour energy  drink with her medical provider regarding recommendation  plan return in 2 weeks                   Diagnosis: Axis I: Major Depression, single episode    Axis II: No diagnosis  Collaboration of Care: Psychiatrist AEB  patient works with psychiatrist Dr. Tenny Craw.  , Clinician reviewing chart  Patient/Guardian was advised Release of Information must be obtained prior to any record release in order to collaborate their care with an outside provider. Patient/Guardian was advised if they have not already done so to contact the registration department to sign all necessary forms in order for [place] to release information regarding their care.   Consent: Patient/Guardian gives verbal consent for treatment and assignment of benefits for services provided during this visit. Patient/Guardian expressed understanding and agreed to proceed.   [name], LCSW 05/08/2022

## 2022-06-04 ENCOUNTER — Ambulatory Visit (INDEPENDENT_AMBULATORY_CARE_PROVIDER_SITE_OTHER): Payer: Medicare Other | Admitting: Psychiatry

## 2022-06-04 DIAGNOSIS — F321 Major depressive disorder, single episode, moderate: Secondary | ICD-10-CM

## 2022-06-04 DIAGNOSIS — F329 Major depressive disorder, single episode, unspecified: Secondary | ICD-10-CM

## 2022-06-04 NOTE — Progress Notes (Signed)
Virtual Visit via Telephone Note  I connected with Erica Bennett on 06/04/22 at 11:14 AM EDT  by telephone and verified that I am speaking with the correct person using two identifiers.  Location: Patient: Home Provider: [facility] [place] office    I discussed the limitations, risks, security and privacy concerns of performing an evaluation and management service by telephone and the availability of in person appointments. I also discussed with the patient that there may be a patient responsible charge related to this service. The patient expressed understanding and agreed to proceed.    I provided 45 minutes of non-face-to-face time during this encounter.   [name], LCSW                 THERAPIST PROGRESS NOTE          Session Time:  Thursday   06/04/2022 11:14 AM  - 11:59 AM   Participation Level: Active  Behavioral Response: Alert, euthymic  Type of Therapy: Individual Therapy  Treatment Goals addressed:  Reduce frequency, intensity, and duration of PTSD symptoms so daily functioning is improved: AEB reducing intensity and frequency of episodes of  shaking to 1 day per week for 60 days Sharlynn WILL PRACTICE EMOTION REGULATION SKILLS 5 PER WEEK FOR THE NEXT  12 WEEKS ays, reduce intensity of startle reactions by 50% per pt's report.  Progress on Goals: progressing       Interventions: CBT and Supportive  Summary: Erica Bennett is a 77 y.o. female who  is self -referred and is a returning patient to this clinician. She denies any psychiatric hospitalizations. She continues to see psychiatrist Dr. Tenny Craw  for medicatiion management.  Patient is resuming services due to grief and loss issues, worry, anxiety, and intrusive recollections of husband's death.  Her husband died by suicide in 16-May-2020. He shot himself in the head in patient's presence.  Patient is blind and reports she had to touch the entry and exit areas made by the bullet in order to give  information to 911 dispatcher when she called for help for husband.    Patient last was seen via virtual visit about 6 weeks ago.   She reports continued decreased symptoms of PTSD since last session.  She reports having 1 episode of reexperiencing triggered by a loud noise when a water bottle fell off the shelf and hit the door in her pantry.  However, patient reports successfully using relaxation techniques and self talk to manage.  She reports returning to her normal level of functioning after incident and about 45 minutes.  She reports managing her wedding anniversary as well as the anniversary of her husband's death by attending a mass.  She reports experiencing positive and negative feelings but managing well.  She was not able to find anyone to transport her to her husband's grave and expresses some disappointment about this.  She reports increased changes in her life as several of her friends have not been as available due to their own personal lives.  They have maintained contact with each other via phone but patient has not been able to do activities outside of her home as much.  She continues to attend church.  Patient reports she has adjusted to being home more as she has intentionally increased efforts to do various activities at home such as household tasks, taking care of her cat, and reading.  Patient reports continuing to practice relaxation techniques including deep breathing, using body scan meditation, and  using the treadmill.    Suicidal/Homicidal: Nowithout intent/plan       Therapist Response:, reviewed symptoms, praised and reinforced patient's use of grounding techniques/relaxation techniques to cope with episode of reexperiencing, discussed effects, facilitated patient expressing thoughts and feelings for regarding husband, validated and normalized feelings related to grief and loss, praised and reinforced patient's of helpful coping strategies and coping with the anniversary of  husband's death, encouraged patient to continue practicing relaxation techniques daily, began to discuss next steps for treatment   plan return in 2 weeks                   Diagnosis: Axis I: Major Depression, single episode    Axis II: No diagnosis  Collaboration of Care: Psychiatrist AEB  patient works with psychiatrist Dr. Tenny Craw.  , Clinician reviewing chart  Patient/Guardian was advised Release of Information must be obtained prior to any record release in order to collaborate their care with an outside provider. Patient/Guardian was advised if they have not already done so to contact the registration department to sign all necessary forms in order for [place] to release information regarding their care.   Consent: Patient/Guardian gives verbal consent for treatment and assignment of benefits for services provided during this visit. Patient/Guardian expressed understanding and agreed to proceed.   [name], LCSW 06/04/2022

## 2022-06-18 ENCOUNTER — Ambulatory Visit (INDEPENDENT_AMBULATORY_CARE_PROVIDER_SITE_OTHER): Payer: Medicare Other | Admitting: Psychiatry

## 2022-06-18 DIAGNOSIS — F329 Major depressive disorder, single episode, unspecified: Secondary | ICD-10-CM

## 2022-06-18 DIAGNOSIS — F321 Major depressive disorder, single episode, moderate: Secondary | ICD-10-CM

## 2022-06-18 NOTE — Progress Notes (Signed)
Virtual Visit via Telephone Note  I connected with Erica Bennett on 06/18/22 at 10:14 AM - by telephone and verified that I am speaking with the correct person using two identifiers.  Location: Patient:  Home Provider: [facility] [place] office    I discussed the limitations, risks, security and privacy concerns of performing an evaluation and management service by telephone and the availability of in person appointments. I also discussed with the patient that there may be a patient responsible charge related to this service. The patient expressed understanding and agreed to proceed.   I provided 46 minutes of non-face-to-face time during this encounter.   Erica Salvage, LCSW                  THERAPIST PROGRESS NOTE          Session Time:  Thursday   06/18/2022 10:14 AM - 11:00 AM   Participation Level: Active  Behavioral Response: Alert, euthymic  Type of Therapy: Individual Therapy  Treatment Goals addressed:  Reduce frequency, intensity, and duration of PTSD symptoms so daily functioning is improved: AEB reducing intensity and frequency of episodes of  shaking to 1 day per week for 60 days Yuritzy WILL PRACTICE EMOTION REGULATION SKILLS 5 PER WEEK FOR THE NEXT  12 WEEKS ays, reduce intensity of startle reactions by 50% per pt's report.  Progress on Goals: progressing       Interventions: CBT and Supportive  Summary: Erica Bennett is a 77 y.o. female who  is self -referred and is a returning patient to this clinician. She denies any psychiatric hospitalizations. She continues to see psychiatrist Dr. Tenny Craw  for medicatiion management.  Patient is resuming services due to grief and loss issues, worry, anxiety, and intrusive recollections of husband's death.  Her husband died by suicide in 05/12/2020. He shot himself in the head in patient's presence.  Patient is blind and reports she had to touch the entry and exit areas made by the bullet in order to give  information to 911 dispatcher when she called for help for husband.    Patient last was seen via virtual visit about 3 weeks ago.   She reports increased stress and anxiety since last session.  This was triggered by patient recently learning her apartment complex has been sold and she will have a significant increase in her rent in the near future.  Patient cannot afford the increase and is looking for other places to live.  She reports initially being in shock and expresses frustration as well as worry regarding moving.  She does have help from 1 friend who is helping her obtain and complete housing applications.  Patient reports having 1 episode of reexperiencing triggered by loud noises when the clock or her wall fell on the floor.  She reports some difficulty managing this as she was already stressed by the news about the recent increase.  She is still practicing deep breathing, using body scan meditation, and using the treadmill.    Suicidal/Homicidal: Nowithout intent/plan       Therapist Response:, reviewed symptoms, discussed stressors, facilitated expression of thoughts and feelings, validated feelings, assisted patient identify other possible resources and avenues to pursue housing, developed plan with patient to contact personnel with services of the blind regarding possible resources and assistance, encouraged patient patient to follow through with her plans to contact the owners of to her apartment complexes where she is considering moving, praised and reinforced patient's use of grounding  techniques/relaxation techniques to cope with episode of reexperiencing and anxiety,   plan return in 2 weeks                   Diagnosis: Axis I: Major Depression, single episode    Axis II: No diagnosis  Collaboration of Care: Psychiatrist AEB  patient works with psychiatrist Dr. Tenny Craw.  , Clinician reviewing chart  Patient/Guardian was advised Release of Information must be obtained prior to any  record release in order to collaborate their care with an outside provider. Patient/Guardian was advised if they have not already done so to contact the registration department to sign all necessary forms in order for [place] to release information regarding their care.   Consent: Patient/Guardian gives verbal consent for treatment and assignment of benefits for services provided during this visit. Patient/Guardian expressed understanding and agreed to proceed.   Erica Salvage, LCSW 06/18/2022

## 2022-06-21 ENCOUNTER — Telehealth: Payer: Self-pay | Admitting: Pharmacy Technician

## 2022-06-21 NOTE — Telephone Encounter (Signed)
Patient Advocate Encounter   Received notification that prior authorization for Carbatrol 200MG  er capsules is required.   PA submitted on 06/21/2022 Key [id number] Insurance [facility] Medicare Electronic Prior Authorization Request Form Status is pending

## 2022-06-27 NOTE — Telephone Encounter (Signed)
Patient Advocate Encounter  Prior Authorization for Carbatrol 200MG  er capsules has been approved.    PA# [id number] Insurance [facility] Medicare Electronic Prior Authorization Request Form Effective dates: 06/22/2022 through 01/25/2098

## 2022-07-01 ENCOUNTER — Encounter (HOSPITAL_COMMUNITY): Payer: Self-pay

## 2022-07-01 ENCOUNTER — Telehealth (HOSPITAL_COMMUNITY): Payer: Medicare Other | Admitting: Psychiatry

## 2022-07-07 ENCOUNTER — Other Ambulatory Visit (HOSPITAL_COMMUNITY): Payer: Self-pay | Admitting: Psychiatry

## 2022-07-13 ENCOUNTER — Encounter (HOSPITAL_COMMUNITY): Payer: Self-pay | Admitting: Psychiatry

## 2022-07-13 ENCOUNTER — Telehealth (INDEPENDENT_AMBULATORY_CARE_PROVIDER_SITE_OTHER): Payer: Medicare Other | Admitting: Psychiatry

## 2022-07-13 DIAGNOSIS — F4310 Post-traumatic stress disorder, unspecified: Secondary | ICD-10-CM

## 2022-07-13 DIAGNOSIS — F321 Major depressive disorder, single episode, moderate: Secondary | ICD-10-CM

## 2022-07-13 MED ORDER — PRIMIDONE 50 MG PO TABS: 50.0000 mg | ORAL_TABLET | Freq: Every day | ORAL | 2 refills | Status: DC

## 2022-07-13 MED ORDER — ALPRAZOLAM 1 MG PO TABS: 1.0000 mg | ORAL_TABLET | Freq: Two times a day (BID) | ORAL | 0 refills | Status: DC | PRN

## 2022-07-13 MED ORDER — DULOXETINE HCL 60 MG PO CPEP: 60.0000 mg | ORAL_CAPSULE | Freq: Two times a day (BID) | ORAL | 3 refills | Status: DC

## 2022-07-13 NOTE — Progress Notes (Signed)
Virtual Visit via Telephone Note  I connected with Erica Bennett on 07/13/22 at  9:20 AM EDT by telephone and verified that I am speaking with the correct person using two identifiers.  Location: Patient: home Provider: office   I discussed the limitations, risks, security and privacy concerns of performing an evaluation and management service by telephone and the availability of in person appointments. I also discussed with the patient that there may be a patient responsible charge related to this service. The patient expressed understanding and agreed to proceed.     I discussed the assessment and treatment plan with the patient. The patient was provided an opportunity to ask questions and all were answered. The patient agreed with the plan and demonstrated an understanding of the instructions.   The patient was advised to call back or seek an in-person evaluation if the symptoms worsen or if the condition fails to improve as anticipated.  I provided 15 minutes of non-face-to-face time during this encounter.   Erica Ruder, MD  [facility] MD/PA/NP OP Progress Note  07/13/2022 9:51 AM Erica Bennett  MRN:  [id number]  Chief Complaint:  Chief Complaint  Patient presents with   Anxiety   Depression   Follow-up   HPI: This patient is a 77 year old widowed white female who lives alone in [place].  She has no children.  She is on disability for blindness.  The patient returns for follow-up after 3 months regarding her depression anxiety and PTSD.  Overall she has been fairly stable.  She got very stressed recently because her apartment building was brought out and the new landlord was going to charge a lot more rent.  However he is going to allow her to stay at least until December at the same rent that she is paying now.  She does have a sense of relief about this.  She is applying for lower income housing.  Overall her mood has been fairly good and she has been getting out with friends  and family and going to church etc.  She has been sleeping fairly well.  She admits that she uses "5-hour energy" some days to help her have the energy to do things.  I warned her to be careful about her heart rate and blood pressure while on this.  She still has some shakiness at night and occasionally uses the primidone.  She occasionally uses the Xanax through the day.  She she denies any thoughts of suicide or self-harm. Visit Diagnosis:    ICD-10-CM   1. Moderate single current episode of major depressive disorder (HCC)  F32.1     2. PTSD (post-traumatic stress disorder)  F43.10       Past Psychiatric History: Prior outpatient treatment  Past Medical History:  Past Medical History:  Diagnosis Date   Anxiety    Blindness of both eyes    due to degenerative retina and acute glaucoma   Complication of anesthesia    pt has limited mouth opening due to bilateral tmj surgery   Fecal incontinence    Fibromyalgia    Generalized seizure disorder [facility])    neurologist-- dr Anne Hahn--  seizure is staring off and dropping things  (12-20-2019  per pt last seizure approx. 2005)   GERD (gastroesophageal reflux disease)    H/O Hashimoto thyroiditis    History of acute angle-closure glaucoma    bilateral eyes  s/p eye removal, now has prosthesis   History of hypertension    History of skin  cancer    History of thyroid nodule    bx left thyroid nodule 11-17-2017;  s/p left thyroidectomy 11-07-2018, benign mass   Hyperlipidemia    Limitation of opening of mouth    per pt due to bilateral tmj surgery 1989   MDD (major depressive disorder)    Migraines    Mixed connective tissue disease (HCC)    rheumotologist-- dr Dierdre Forth--  taking plaquenil   OA (osteoarthritis)    OSA (obstructive sleep apnea)    does not use CPAP   Osteoporosis    PONV (postoperative nausea and vomiting)    and hard to wake   Prosthetic eye globe    bilateral   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 ;  s/p enucleation     Past Surgical History:  Procedure Laterality Date   ABDOMINAL HYSTERECTOMY  1980s   AND APPENDECTOMY   ENUCLEATION Right 1984   right eye   EVISCERATION Left 1996   left eye   LAPAROSCOPIC CHOLECYSTECTOMY  1993   SHOULDER ARTHROSCOPY WITH SUBACROMIAL DECOMPRESSION Right 09-15-2007  @[facility]    AND BURSECTOMY/  DEBRIDEMENT ROTATOR CUFF   TEMPOROMANDIBULAR JOINT SURGERY Bilateral 1989   THYROIDECTOMY Left 11/07/2018   Procedure: LEFT HEMI THYROIDECTOMY;  Surgeon: Newman Pies, MD;  Location: [facility];  Service: ENT;  Laterality: Left;   TONSILLECTOMY  child    Family Psychiatric History: See below  Family History:  Family History  Problem Relation Age of Onset   Colon cancer Mother    Parkinson's disease Mother    Depression Maternal Aunt    Colon cancer Maternal Aunt    Depression Paternal Aunt    Alcohol abuse Paternal Uncle    Drug abuse Paternal Uncle    Parkinson's disease Maternal Grandfather    Depression Cousin    Colon cancer Other     Social History:  Social History   Socioeconomic History   Marital status: Married    Spouse name: Not on file   Number of children: 0   Years of education: 18   Highest education level: Not on file  Occupational History   Occupation: Retired  Tobacco Use   Smoking status: Former    Years: 20    Types: Cigarettes    Quit date: 01/03/1992    Years since quitting: 30.5   Smokeless tobacco: Never  Vaping Use   Vaping Use: Never used  Substance and Sexual Activity   Alcohol use: No    Alcohol/week: 0.0 standard drinks of alcohol   Drug use: Never   Sexual activity: Yes    Birth control/protection: Surgical  Other Topics Concern   Not on file  Social History Narrative   POA-Dennis   Caffeine use: daily (1 soda per day, 1 coffee per day)   Left handed    Legally blind      Worked as LPN/RN for about 8 years   Social Determinants of Manufacturing engineer Strain: Not on file  Food Insecurity: Not on file  Transportation Needs: Not on file  Physical Activity: Not on file  Stress: Not on file  Social Connections: Not on file    Allergies:  Allergies  Allergen Reactions   Bacitracin Rash   Neomycin Rash   Sulfa Antibiotics Swelling and Rash   Sulfamethoxazole Swelling and Other (See Comments)   Egg-Derived Products Diarrhea and Nausea And Vomiting    Stomach cramps  Elavil [Amitriptyline Hcl] Other (See Comments)    Really bad tremors   Gabapentin Other (See Comments)    Balance issues   Mixed Ragweed     Nasal congestion   Prozac [Fluoxetine Hcl]     Severe headache    Metabolic Disorder Labs: No results found for: "HGBA1C", "MPG" No results found for: "PROLACTIN" Lab Results  Component Value Date   CHOL 203 (H) 11/18/2017   TRIG 227 (H) 11/18/2017   HDL 51 11/18/2017   CHOLHDL 4.0 11/18/2017   LDLCALC 107 (H) 11/18/2017   Lab Results  Component Value Date   TSH 1.940 03/24/2021   TSH 2.940 03/02/2018    Therapeutic Level Labs: No results found for: "LITHIUM" No results found for: "VALPROATE" Lab Results  Component Value Date   CBMZ 4.0 01/16/2021   CBMZ 6.1 11/14/2019    Current Medications: Current Outpatient Medications  Medication Sig Dispense Refill   ALPRAZolam (XANAX) 1 MG tablet Take 1 tablet (1 mg total) by mouth 2 (two) times daily as needed. for anxiety 60 tablet 0   CARBATROL 200 MG 12 hr capsule TAKE ONE CAPSULE BY MOUTH ONCE DAILY 90 capsule 3   Cholecalciferol (VITAMIN D3 PO) Take 1 tablet by mouth daily.     DEXILANT 60 MG capsule TAKE ONE CAPSULE BY MOUTH DAILY; ON AN EMPTY STOMACH, THEN DO NOT EAT FOR ONE HOUR. (Patient taking differently: Take 60 mg by mouth daily before breakfast.) 30 capsule 5   DULoxetine (CYMBALTA) 60 MG capsule Take 1 capsule (60 mg total) by mouth 2 (two) times daily. 180 capsule 3   HYDROcodone-acetaminophen (NORCO/VICODIN) 5-325 MG tablet  Take 1 tablet by mouth every 6 (six) hours as needed for moderate pain.     hydroxychloroquine (PLAQUENIL) 200 MG tablet Take 1 tablet (200 mg total) by mouth 2 (two) times daily. (Patient taking differently: Take 100 mg by mouth 2 (two) times daily. Take four 2 tablets by mouth with food or milk once daily.) 180 tablet 1   MAXALT 10 MG tablet Take 1 tablet (10 mg total) by mouth as needed. (Patient taking differently: Take 10 mg by mouth 3 (three) times daily as needed for migraine.) 10 tablet 5   metoprolol succinate (TOPROL-XL) 100 MG 24 hr tablet Take 1 tablet (100 mg total) by mouth every evening. (Patient taking differently: Take 100 mg by mouth every evening.) 90 tablet 0   NARCAN 4 MG/0.1ML LIQD nasal spray kit Place 1 spray into the nose as needed (accidental overdose.).     NIACIN PO Take by mouth daily.     ondansetron (ZOFRAN) 4 MG tablet Take 4 mg by mouth every 8 (eight) hours as needed for nausea or vomiting.     primidone (MYSOLINE) 50 MG tablet Take 1 tablet (50 mg total) by mouth at bedtime. 30 tablet 2   VITAMIN D PO Take 1 tablet by mouth daily.     No current facility-administered medications for this visit.     Musculoskeletal: Strength & Muscle Tone: na Gait & Station: na Patient leans: N/A  Psychiatric Specialty Exam: Review of Systems  Constitutional:  Positive for fatigue.  Eyes:  Positive for visual disturbance.  Musculoskeletal:  Positive for arthralgias and myalgias.  All other systems reviewed and are negative.   There were no vitals taken for this visit.There is no height or weight on file to calculate BMI.  General Appearance: NA  Eye Contact:  None  Speech: Clear and coherent  Volume:  Normal  Mood:  Euthymic  Affect:  NA  Thought Process:  Goal Directed  Orientation:  Full (Time, Place, and Person)  Thought Content: Rumination   Suicidal Thoughts:  No  Homicidal Thoughts:  No  Memory:  Immediate;   Good Recent;   Good Remote;   Good   Judgement:  Good  Insight:  Good  Psychomotor Activity:  Decreased  Concentration:  Concentration: Good and Attention Span: Good  Recall:  Good  Fund of Knowledge: Good  Language: Good  Akathisia:  No  Handed:  Right  AIMS (if indicated): not done  Assets:  Communication Skills Desire for Improvement Resilience Social Support  ADL's:  Intact  Cognition: WNL  Sleep:  Good   Screenings: GAD-7    Advertising copywriter from 03/30/2019 in [facility] [place]  Total GAD-7 Score 2      PHQ2-9    Flowsheet Row Video Visit from 03/31/2022 in [facility] [place] Video Visit from 09/30/2021 in [facility] [place] Counselor from 07/16/2021 in [facility] [place] Video Visit from 07/01/2021 in [facility] at [place] Video Visit from 03/31/2021 in [facility] Total Score 0 0 1 0 0      Flowsheet Row Video Visit from 03/31/2022 in [facility] at [place] Video Visit from 09/30/2021 in [facility] at [place] Counselor from 07/16/2021 in [facility] at [place]  C-SSRS RISK CATEGORY No Risk No Risk No Risk        Assessment and Plan: This patient is a 77 year old female with a history of depression anxiety and PTSD related to her husband suicide by gunshot.  Overall she continues to do well.  She will continue Cymbalta 60 mg twice daily for depression and fibromyalgia, Xanax 1 mg twice daily as needed for anxiety and Mysoline 50 mg at bedtime for shakiness.  She will return to see me in 3 months  Collaboration of Care: Collaboration of Care: Referral or follow-up with counselor/therapist AEB patient will continue therapy with Florencia Reasons in our office  Patient/Guardian was advised Release of  Information must be obtained prior to any record release in order to collaborate their care with an outside provider. Patient/Guardian was advised if they have not already done so to contact the registration department to sign all necessary forms in order for [place] to release information regarding their care.   Consent: Patient/Guardian gives verbal consent for treatment and assignment of benefits for services provided during this visit. Patient/Guardian expressed understanding and agreed to proceed.    Erica Ruder, MD 07/13/2022, 9:51 AM

## 2022-07-24 ENCOUNTER — Ambulatory Visit (INDEPENDENT_AMBULATORY_CARE_PROVIDER_SITE_OTHER): Payer: Medicare Other | Admitting: Psychiatry

## 2022-07-24 DIAGNOSIS — F321 Major depressive disorder, single episode, moderate: Secondary | ICD-10-CM

## 2022-07-24 NOTE — Progress Notes (Signed)
Virtual Visit via Telephone Note  I connected with Erica Bennett on 07/24/22 at 9:10 AM EDT  by telephone and verified that I am speaking with the correct person using two identifiers.  Location: Patient: Home Provider: [facility] Outpatient [place] office    I discussed the limitations, risks, security and privacy concerns of performing an evaluation and management service by telephone and the availability of in person appointments. I also discussed with the patient that there may be a patient responsible charge related to this service. The patient expressed understanding and agreed to proceed.    I provided 45 minutes of non-face-to-face time during this encounter.   [name], LCSW                 THERAPIST PROGRESS NOTE          Session Time:  Friday 07/24/2022 9:10 AM - 9:55 AM   Participation Level: Active  Behavioral Response: Alert, euthymic  Type of Therapy: Individual Therapy  Treatment Goals addressed:  Reduce frequency, intensity, and duration of PTSD symptoms so daily functioning is improved: AEB reducing intensity and frequency of episodes of  shaking to 1 day per week for 60 days [place] WILL PRACTICE EMOTION REGULATION SKILLS 5 PER WEEK FOR THE NEXT  12 WEEKS ays, reduce intensity of startle reactions by 50% per pt's report.  Progress on Goals: progressing       Interventions: CBT and Supportive  Summary: Erica Bennett is a 77 y.o. female who  is self -referred and is a returning patient to this clinician. She denies any psychiatric hospitalizations. She continues to see psychiatrist Dr. Tenny Craw  for medicatiion management.  Patient is resuming services due to grief and loss issues, worry, anxiety, and intrusive recollections of husband's death.  Her husband died by suicide in May 24, 2020. He shot himself in the head in patient's presence.  Patient is blind and reports she had to touch the entry and exit areas made by the bullet in order to give information  to 911 dispatcher when she called for help for husband.    Patient last was seen via virtual visit about 4 weeks ago.   She reports increased stress and anxiety regarding her living situation as she has not had an increase in her rent.  She is hopeful her rent will remain the same until when and if she decides to move.  She has done problem solving and has rearranged some of her finances to cover the increase should it occur.  He also reports receiving an offer from a friend to assist her with expenses if needed.  She also is on the waiting list at 2 apartment complexes for subsidized housing.  Patient reports having one flashback since last session.  This was triggered by female acquaintance who attended AA with patient's husband years ago dying by suicide via gunshot to the head a couple of weeks ago.  Patient reports flashback was over in a few seconds but remaining nervous and easily startled for several days.  She also reports still being somewhat nervous and jumpy today.  Patient reports she has been practicing deep breathing and body scan meditation.     Suicidal/Homicidal: Nowithout intent/plan       Therapist Response:, reviewed symptoms, discussed stressors, facilitated expression of thoughts and feelings, validated feelings, praised and reinforced patient's use of problem-solving skills and her resources, facilitated patient expressing thoughts and feelings regarding acquaintances's death by suicide, validated feelings, praised and reinforced patient's efforts  to use grounding/relaxation techniques, discussed effects of use, reviewed psychoeducation on the threat/response system, discussed rationale for and assisted patient practice pelvic floor relaxation exercise to trigger relaxation response, checked patient's reaction to exercise, developed plan with patient to practice relaxation techniques between sessions,    plan return in 2 weeks                   Diagnosis: Axis I: Major Depression,  single episode    Axis II: No diagnosis  Collaboration of Care: Psychiatrist AEB  patient works with psychiatrist Dr. Tenny Craw.  , Clinician reviewing chart  Patient/Guardian was advised Release of Information must be obtained prior to any record release in order to collaborate their care with an outside provider. Patient/Guardian was advised if they have not already done so to contact the registration department to sign all necessary forms in order for [place] to release information regarding their care.   Consent: Patient/Guardian gives verbal consent for treatment and assignment of benefits for services provided during this visit. Patient/Guardian expressed understanding and agreed to proceed.   [name], LCSW 07/24/2022

## 2022-08-07 ENCOUNTER — Ambulatory Visit (HOSPITAL_COMMUNITY): Payer: Medicare Other | Admitting: Psychiatry

## 2022-08-21 ENCOUNTER — Ambulatory Visit (INDEPENDENT_AMBULATORY_CARE_PROVIDER_SITE_OTHER): Payer: Medicare Other | Admitting: Psychiatry

## 2022-08-21 DIAGNOSIS — F321 Major depressive disorder, single episode, moderate: Secondary | ICD-10-CM

## 2022-08-21 DIAGNOSIS — F4310 Post-traumatic stress disorder, unspecified: Secondary | ICD-10-CM | POA: Diagnosis not present

## 2022-08-21 NOTE — Progress Notes (Signed)
Virtual Visit via Telephone Note  I connected with Erica Bennett on 08/21/22 at  9:00 AM EDT by telephone and verified that I am speaking with the correct person using two identifiers.  Location: Patient: Home Provider: [facility] [place] office    I discussed the limitations, risks, security and privacy concerns of performing an evaluation and management service by telephone and the availability of in person appointments. I also discussed with the patient that there may be a patient responsible charge related to this service. The patient expressed understanding and agreed to proceed.    I provided 35 minutes of non-face-to-face time during this encounter.   [name], LCSW                  THERAPIST PROGRESS NOTE          Session Time:  Friday 08/21/2022 9:00 AM -  9:35 AM   Participation Level: Active  Behavioral Response: Alert, euthymic  Type of Therapy: Individual Therapy  Treatment Goals addressed:  Reduce frequency, intensity, and duration of PTSD symptoms so daily functioning is improved: AEB reducing intensity and frequency of episodes of  shaking to 1 day per week for 60 days [place] WILL PRACTICE EMOTION REGULATION SKILLS 5 PER WEEK FOR THE NEXT  12 WEEKS ays, reduce intensity of startle reactions by 50% per pt's report.  Progress on Goals: progressing       Interventions: CBT and Supportive  Summary: Erica Bennett is a 77 y.o. female who  is self -referred and is a returning patient to this clinician. She denies any psychiatric hospitalizations. She continues to see psychiatrist Dr. Tenny Craw  for medicatiion management.  Patient is resuming services due to grief and loss issues, worry, anxiety, and intrusive recollections of husband's death.  Her husband died by suicide in May 25, 2020. He shot himself in the head in patient's presence.  Patient is blind and reports she had to touch the entry and exit areas made by the bullet in order to give  information to 911 dispatcher when she called for help for husband.    Patient last was seen via virtual visit about 4 weeks ago.   She reports doing very well mentally since last session.  She reports having 1 flashback since last session.  Per her report, flashback was deviated but did not last for very long.  Patient could not identify any triggers.  She responded by using pelvic floor relaxation exercise and reports this was very helpful.  She is pleased with her response and reports significant decrease in duration of nervousness compared to her response to previous flashbacks.  Patient has been practicing pelvic floor relaxation exercises almost daily and has noticed improved sleep pattern.  Patient reports doing much better and being hopeful.  Per her report friends have commented she does not appear depressed and that she seems to be more patient's.  Patient denies feeling depressed and states she is at peace.  She is on a month-to-month lease at her current apartment and reports cooperation/support from landlord.  She is on the waiting list at 2 other apartment complexes as she wants to downsize.   Suicidal/Homicidal: Nowithout intent/plan       Therapist Response:, reviewed symptoms, praised and reinforced patient's use of relaxation exercise in responsecto recent flashback, discussed the effects of use, praised and reinforced patient's regular practice and developed plan for patient to continue practicing relaxation exercises, discussed patient's progress in treatment, discussed stepdown plan to termination  to include 3 more sessions focused on identifying and implementing relapse prevention strategies    plan return in 2 weeks                   Diagnosis: Axis I: Major Depression, single episode    Axis II: No diagnosis  Collaboration of Care: Psychiatrist AEB  patient works with psychiatrist Dr. Tenny Craw.  , Clinician reviewing chart  Patient/Guardian was advised Release of Information must  be obtained prior to any record release in order to collaborate their care with an outside provider. Patient/Guardian was advised if they have not already done so to contact the registration department to sign all necessary forms in order for [place] to release information regarding their care.   Consent: Patient/Guardian gives verbal consent for treatment and assignment of benefits for services provided during this visit. Patient/Guardian expressed understanding and agreed to proceed.   [name], LCSW 08/21/2022

## 2022-09-23 IMAGING — CT CT NECK W/ CM
5 series · 16 of 33 positions shown, 18 images · IV contrast (agent unspecified)
Comparison: None Available.

CLINICAL DATA: Mass of right side of neck

EXAM:
CT NECK WITH CONTRAST
TECHNIQUE: Multidetector CT imaging of the neck was performed using the
standard protocol following the bolus administration of intravenous
contrast.

[Series 2: axial neck · axial · 0.58mm/px · z∈[-78,-12]mm · 2 of 100 slices shown]
[im 34/100  bone]
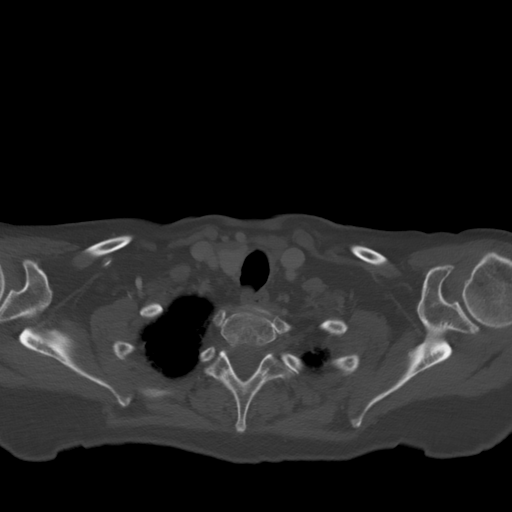
[im 67/100  bone]
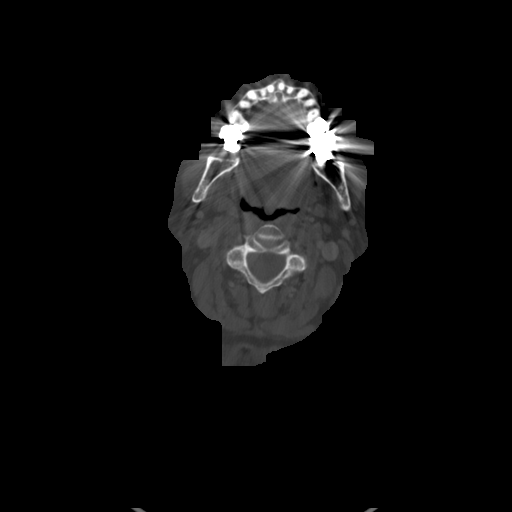

[Series 3: axial bone · axial · 0.58mm/px · z∈[-96,+4]mm · 3 of 100 slices shown, 4 images]
[im 25/100  soft-tissue]
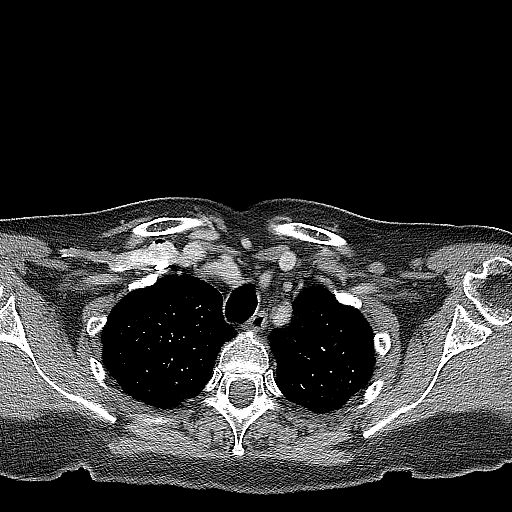
[im 25/100  bone]
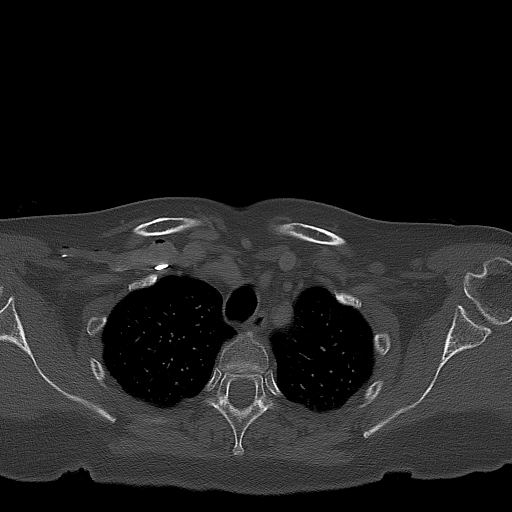
[im 50/100  bone]
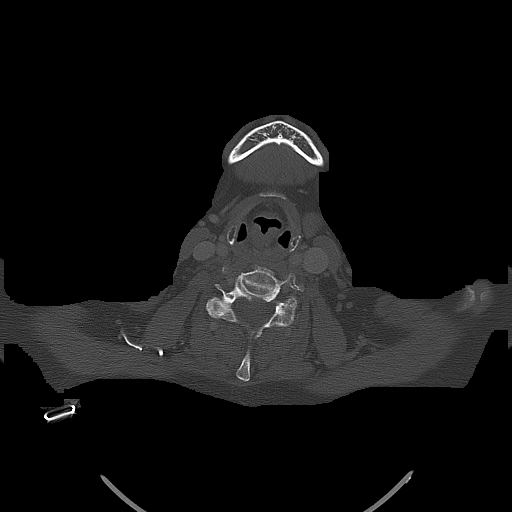
[im 75/100  bone]
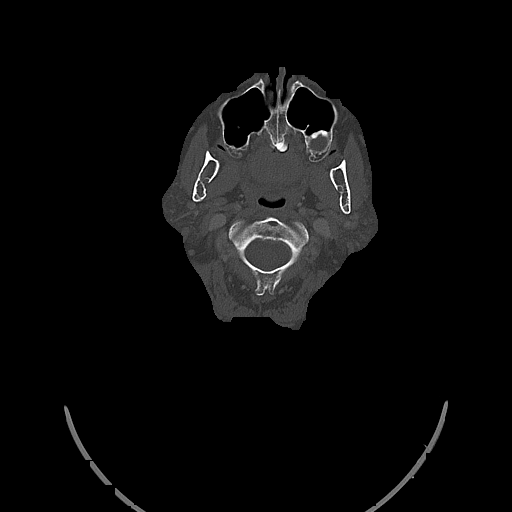

[Series 4: cor neck · coronal · 0.35mm/px · 3 of 108 slices shown]
[im 22/108  bone]
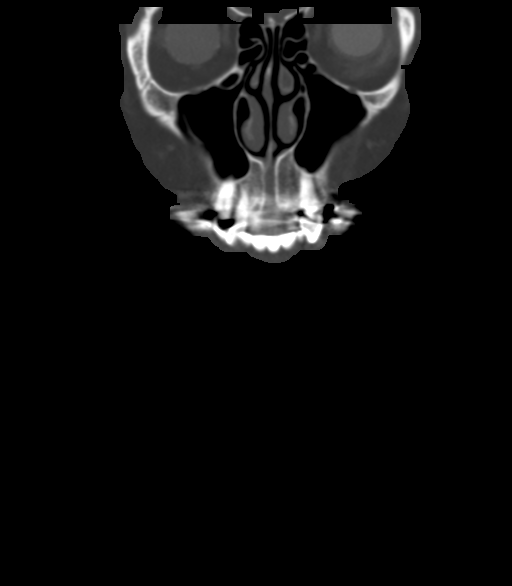
[im 43/108  bone]
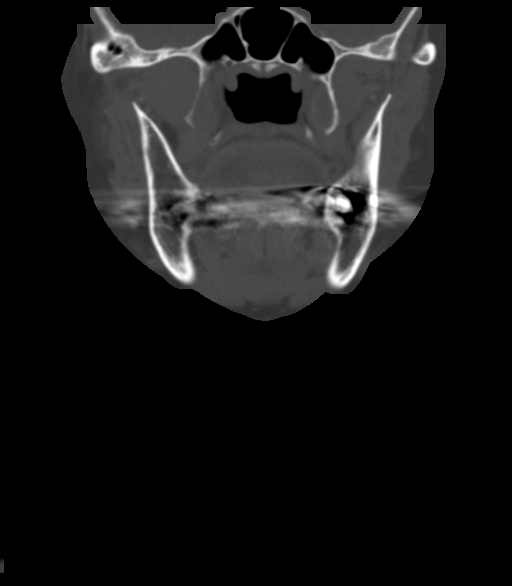
[im 65/108  bone]
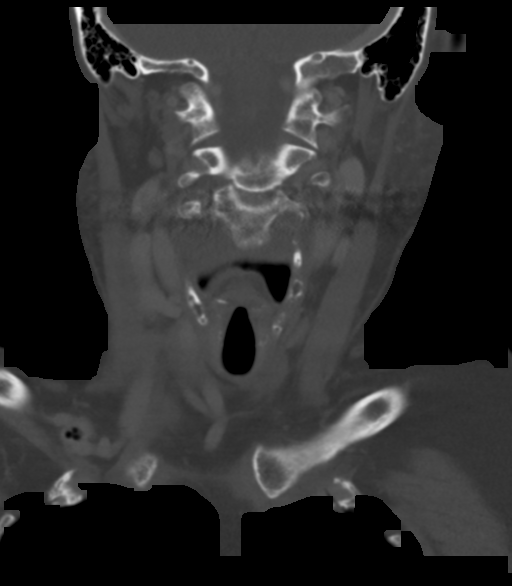

[Series 5: sag neck · sagittal · 0.40mm/px · 5 of 90 slices shown, 6 images]
[im 30/90  bone]
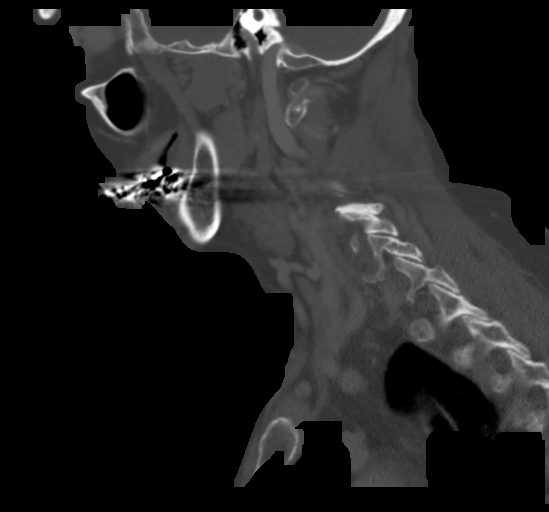
[im 38/90  bone]
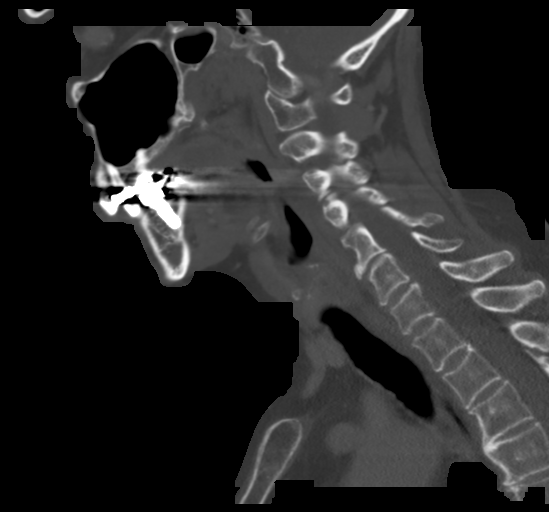
[im 45/90  soft-tissue]
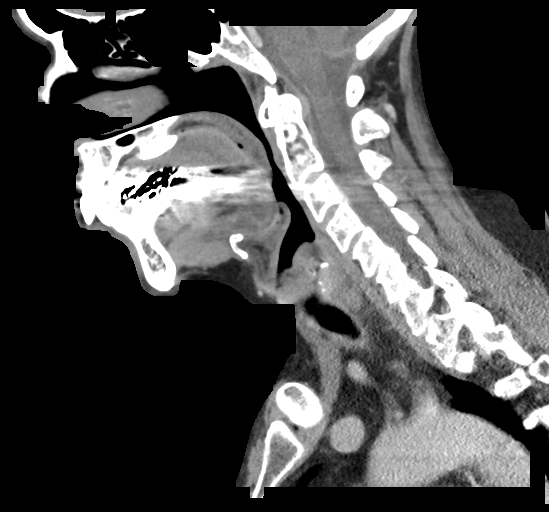
[im 45/90  bone]
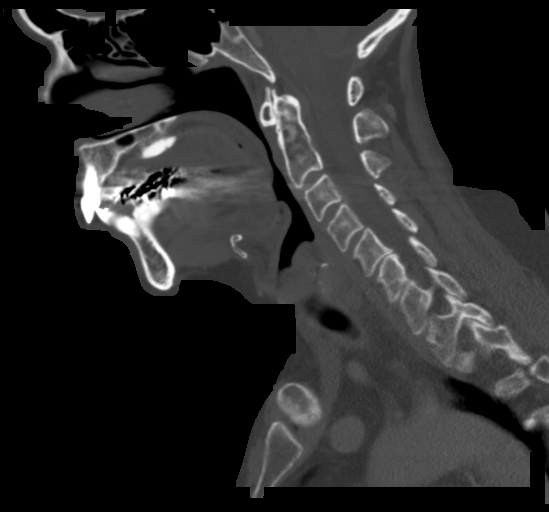
[im 52/90  bone]
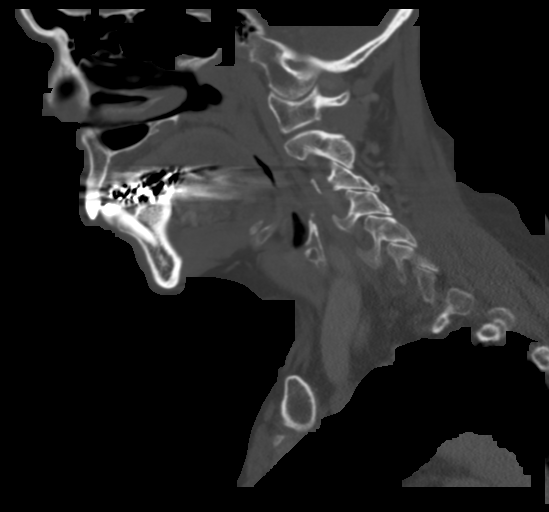
[im 60/90  bone]
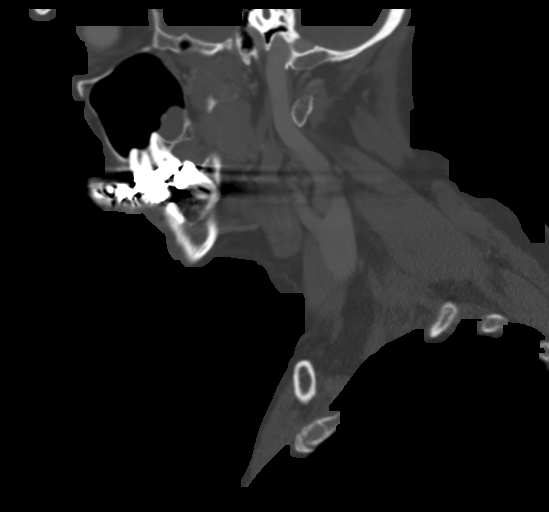

[Series 6: ax oropharynx (person_name) · axial · 0.38mm/px · z∈[-96,+4]mm · 3 of 100 slices shown]
[im 25/100  bone]
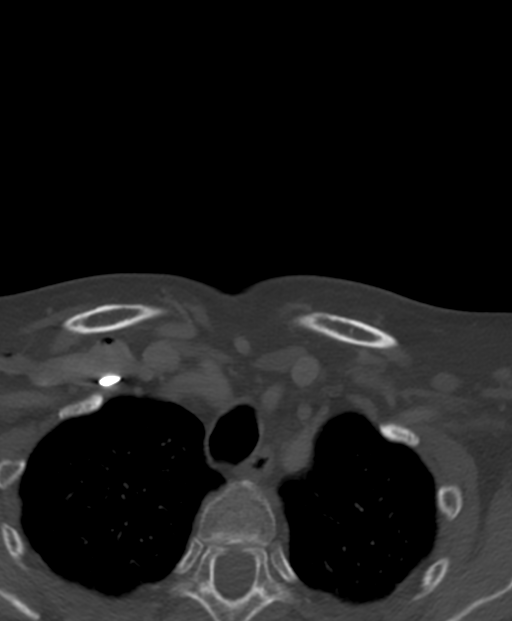
[im 50/100  bone]
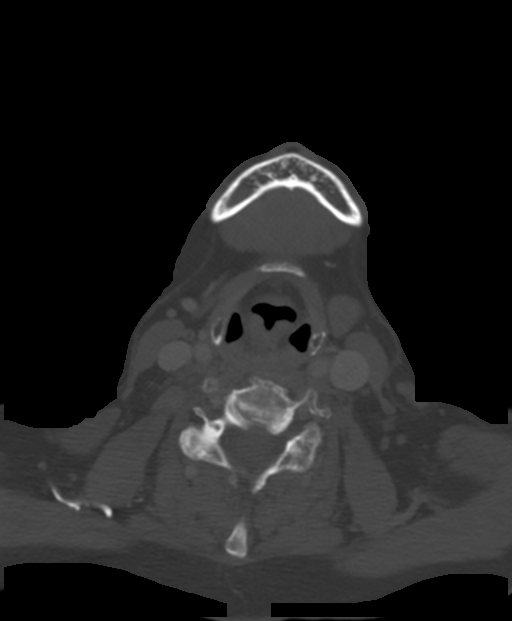
[im 75/100  bone]
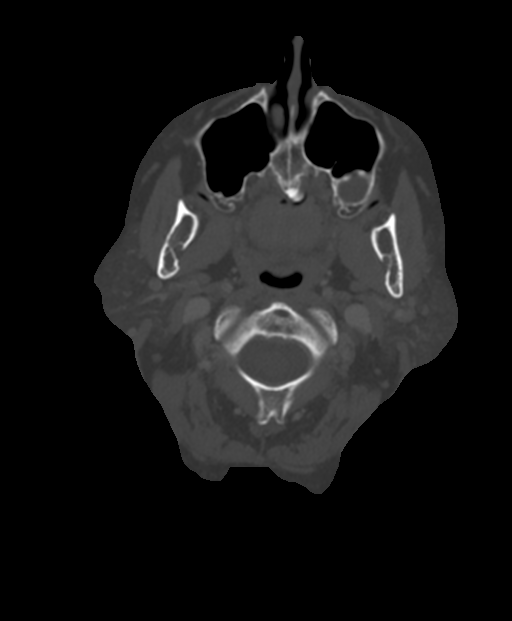

[16 of 33 positions shown; findings below may reference images not displayed]

RADIATION DOSE REDUCTION: This exam was performed according to the
departmental dose-optimization program which includes automated
exposure control, adjustment of the mA and/or kV according to
patient size and/or use of iterative reconstruction technique.

CONTRAST:  75mL OMNIPAQUE IOHEXOL 300 MG/ML  SOLN
FINDINGS: Pharynx and larynx: Unremarkable.  No mass or swelling.

Salivary glands: Parotid and submandibular glands are unremarkable.

Thyroid: Left thyroid is absent.

Lymph nodes: No enlarged or abnormal density nodes.

Vascular: Major neck vessels are patent.

Limited intracranial: No abnormal enhancement.

Visualized orbits: Bilateral globe prostheses.

Mastoids and visualized paranasal sinuses: Minor mucosal thickening.
Mastoid air cells are clear.

Skeleton: No acute osseous abnormality.

Upper chest: Patchy ground-glass density in the right upper lobe.

Other: There is no skin marker present. Torus palatini and
mandibularis noted.
IMPRESSION: No neck mass or adenopathy.

## 2022-09-25 ENCOUNTER — Ambulatory Visit (INDEPENDENT_AMBULATORY_CARE_PROVIDER_SITE_OTHER): Payer: Medicare Other | Admitting: Psychiatry

## 2022-09-25 DIAGNOSIS — F321 Major depressive disorder, single episode, moderate: Secondary | ICD-10-CM | POA: Diagnosis not present

## 2022-09-25 NOTE — Progress Notes (Signed)
Virtual Visit via Telephone Note  I connected with Erica Bennett on 09/25/22 at 11:07 AM EDT by telephone and verified that I am speaking with the correct person using two identifiers.  Location: Patient: Home Provider: [facility] Outpatient [place] office    I discussed the limitations, risks, security and privacy concerns of performing an evaluation and management service by telephone and the availability of in person appointments. I also discussed with the patient that there may be a patient responsible charge related to this service. The patient expressed understanding and agreed to proceed.   I provided 39 minutes of non-face-to-face time during this encounter.   [name], LCSW                  THERAPIST PROGRESS NOTE          Session Time:  Friday 09/25/2022 11:07 AM - 11:46 AM   Participation Level: Active  Behavioral Response: Alert, euthymic  Type of Therapy: Individual Therapy  Treatment Goals addressed:  Reduce frequency, intensity, and duration of PTSD symptoms so daily functioning is improved: AEB reducing intensity and frequency of episodes of  shaking to 1 day per week for 60 days Erica Bennett WILL PRACTICE EMOTION REGULATION SKILLS 5 PER WEEK FOR THE NEXT  12 WEEKS ays, reduce intensity of startle reactions by 50% per pt's report.  Progress on Goals: progressing       Interventions: CBT and Supportive  Summary: Erica Bennett is a 77 y.o. female who  is self -referred and is a returning patient to this clinician. She denies any psychiatric hospitalizations. She continues to see psychiatrist Dr. Tenny Craw  for medicatiion management.  Patient is resuming services due to grief and loss issues, worry, anxiety, and intrusive recollections of husband's death.  Her husband died by suicide in 06/01/20. He shot himself in the head in patient's presence.  Patient is blind and reports she had to touch the entry and exit areas made by the bullet in order to give  information to 911 dispatcher when she called for help for husband.       Patient last was seen via virtual visit about 4 weeks ago.   She reports still doing well since last session.  She did contract COVID and was isolated for about 10 days but reports managing this well.  She denies any symptoms of depression.  She also denies experiencing any flashbacks or intrusive memories since last session.  Since recovering from COVID, she has resumed her normal involvement in activities.  Patient also reports experiencing increased energy and thinking more clearly.  She attributes this to recently going without her seizure medication for about a month as she had run out.  During that time, she did not experience any seizures.  She then decided to not resume taking medication and will discuss with her neurologist next month.  She reports to reduce use of Xanax and adding one half of the primidone to address the shaking.  She reports sleeping well.  Patient also reports continuing to practice relaxation techniques.   Suicidal/Homicidal: Nowithout intent/plan       Therapist Response:, reviewed symptoms, praised and reinforced patient's efforts to practice relaxation techniques and resume normal involvement in activities, discussed lapse versus relapse of symptoms, assisted patient identify early warning signs related to depression or symptoms of PTSD, reviewed ways to intervene to avoid relapse, will discuss mindfulness strategies next session.    plan return in 2 weeks  Diagnosis: Axis I: Major Depression, single episode    Axis II: No diagnosis  Collaboration of Care: Psychiatrist AEB  patient works with psychiatrist Dr. Tenny Craw.  , Clinician reviewing chart  Patient/Guardian was advised Release of Information must be obtained prior to any record release in order to collaborate their care with an outside provider. Patient/Guardian was advised if they have not already done so to contact the  registration department to sign all necessary forms in order for [place] to release information regarding their care.   Consent: Patient/Guardian gives verbal consent for treatment and assignment of benefits for services provided during this visit. Patient/Guardian expressed understanding and agreed to proceed.   [name], LCSW 09/25/2022

## 2022-10-09 ENCOUNTER — Ambulatory Visit (INDEPENDENT_AMBULATORY_CARE_PROVIDER_SITE_OTHER): Payer: Medicare Other | Admitting: Psychiatry

## 2022-10-09 DIAGNOSIS — F321 Major depressive disorder, single episode, moderate: Secondary | ICD-10-CM | POA: Diagnosis not present

## 2022-10-09 NOTE — Progress Notes (Unsigned)
Virtual Visit via Telephone Note  I connected with Erica Bennett on 10/09/22 at 10:17 AM EDT  by telephone and verified that I am speaking with the correct person using two identifiers.  Location: Patient: Home Provider: [facility] Outpatient [place] office    I discussed the limitations, risks, security and privacy concerns of performing an evaluation and management service by telephone and the availability of in person appointments. I also discussed with the patient that there may be a patient responsible charge related to this service. The patient expressed understanding and agreed to proceed.    I provided 38 minutes of non-face-to-face time during this encounter.   [name], LCSW                 THERAPIST PROGRESS NOTE          Session Time:  Friday 10/09/2022 10:17 AM - 10:55 AM   Participation Level: Active  Behavioral Response: Alert, euthymic  Type of Therapy: Individual Therapy  Treatment Goals addressed:  Reduce frequency, intensity, and duration of PTSD symptoms so daily functioning is improved: AEB reducing intensity and frequency of episodes of  shaking to 1 day per Bennett for 60 days Erica Bennett FOR THE NEXT  12 WEEKS ays, reduce intensity of startle reactions by 50% per pt's report.  Progress on Goals: progressing       Interventions: CBT and Supportive  Summary: Erica Bennett is a 76 y.o. female who  is self -referred and is a returning patient to this clinician. She denies any psychiatric hospitalizations. She continues to see psychiatrist Dr. Tenny Craw  for medicatiion management.  Patient is resuming services due to grief and loss issues, worry, anxiety, and intrusive recollections of husband's death.  Her husband died by suicide in 2020/05/24. He shot himself in the head in patient's presence.  Patient is blind and reports she had to touch the entry and exit areas made by the bullet in order to give  information to 911 dispatcher when she called for help for husband.       Patient last was seen via virtual visit about 2 weeks ago.   She reports still doing well since last session.   She continues to deny any symptoms of depression, experiencing any flashbacks or intrusive memories since last session.  She continues to report doing well without seizure medication but is planning to follow-up with her neurologist this month.  Patient continues to practice relaxation techniques and is pleased with her progress in treatment.  She expresses confidence in her use of coping skills.    Suicidal/Homicidal: Nowithout intent/plan       Therapist Response:, reviewed symptoms, praised and reinforced patient's efforts to practice relaxation techniques and maintain normal involvement in activities, provided psychoeducation regarding mindfulness and understanding of the window of tolerance/how this can be used to improve emotional regulation and avoid relapse, discussed rationale for and assisted patient practice grounding techniques to use when outside of the window of tolerance, discussed rationale for and assisted patient practice a mindfulness activity using eating to improve mindfulness skills, also discussed other activities to improve mindfulness skills, developed plan with patient to practice a mindfulness activity daily     plan return in 2 weeks                   Diagnosis: Axis I: Major Depression, single episode    Axis II: No diagnosis  Collaboration of Care: Psychiatrist  AEB  patient works with psychiatrist Dr. Tenny Craw.  , Clinician reviewing chart  Patient/Guardian was advised Release of Information must be obtained prior to any record release in order to collaborate their care with an outside provider. Patient/Guardian was advised if they have not already done so to contact the registration department to sign all necessary forms in order for [place] to release information regarding their care.    Consent: Patient/Guardian gives verbal consent for treatment and assignment of benefits for services provided during this visit. Patient/Guardian expressed understanding and agreed to proceed.   [name], LCSW 10/09/2022

## 2022-10-13 ENCOUNTER — Telehealth (INDEPENDENT_AMBULATORY_CARE_PROVIDER_SITE_OTHER): Payer: Medicare Other | Admitting: Psychiatry

## 2022-10-13 ENCOUNTER — Encounter (HOSPITAL_COMMUNITY): Payer: Self-pay | Admitting: Psychiatry

## 2022-10-13 DIAGNOSIS — F321 Major depressive disorder, single episode, moderate: Secondary | ICD-10-CM

## 2022-10-13 DIAGNOSIS — F4310 Post-traumatic stress disorder, unspecified: Secondary | ICD-10-CM

## 2022-10-13 MED ORDER — PRIMIDONE 50 MG PO TABS: 50.0000 mg | ORAL_TABLET | Freq: Every day | ORAL | 2 refills | Status: DC

## 2022-10-13 MED ORDER — DULOXETINE HCL 60 MG PO CPEP: 60.0000 mg | ORAL_CAPSULE | Freq: Two times a day (BID) | ORAL | 3 refills | Status: DC

## 2022-10-13 MED ORDER — ALPRAZOLAM 1 MG PO TABS: 1.0000 mg | ORAL_TABLET | Freq: Every day | ORAL | 2 refills | Status: DC | PRN

## 2022-10-13 NOTE — Progress Notes (Signed)
Virtual Visit via Telephone Note  I connected with Erica Bennett on 10/13/22 at  9:20 AM EDT by telephone and verified that I am speaking with the correct person using two identifiers.  Location: Patient: home Provider: office   I discussed the limitations, risks, security and privacy concerns of performing an evaluation and management service by telephone and the availability of in person appointments. I also discussed with the patient that there may be a patient responsible charge related to this service. The patient expressed understanding and agreed to proceed.      I discussed the assessment and treatment plan with the patient. The patient was provided an opportunity to ask questions and all were answered. The patient agreed with the plan and demonstrated an understanding of the instructions.   The patient was advised to call back or seek an in-person evaluation if the symptoms worsen or if the condition fails to improve as anticipated.  I provided 15 minutes of non-face-to-face time during this encounter.   Diannia Ruder, MD  [facility] MD/PA/NP OP Progress Note  10/13/2022 9:41 AM Erica Bennett  MRN:  [id number]  Chief Complaint:  Chief Complaint  Patient presents with   Anxiety   Depression   Follow-up   HPI: This patient is a 77 year old widowed white female who lives alone in [place].  She has no children.  She is on disability for blindness.  The patient returns for follow-up after 3 months regarding her depression anxiety and PTSD.  She states that generally she is doing much better.  She states that she has cut back to Xanax and generally only takes it once a day at bedtime or sometimes twice a day.  She has also cut down the primidone and is still sleeping well and having less shaking.  A lot of these things were reinstated after her husband suicide and she is slowly recovering from this.  She states that her mood is good.  She denies significant depression anxiety or  thoughts of self-harm.  She is staying active at church and with friends. Visit Diagnosis:    ICD-10-CM   1. Moderate single current episode of major depressive disorder (HCC)  F32.1     2. 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 2005)   GERD (gastroesophageal reflux disease)    H/O Hashimoto thyroiditis    History of acute angle-closure glaucoma    bilateral eyes  s/p eye removal, now has prosthesis   History of hypertension    History of skin cancer    History of thyroid nodule    bx left thyroid nodule 11-17-2017;  s/p left thyroidectomy 11-07-2018, benign mass   Hyperlipidemia    Limitation of opening of mouth    per pt due to bilateral tmj surgery 1989   MDD (major depressive disorder)    Migraines    Mixed connective tissue disease (HCC)    rheumotologist-- dr Dierdre Forth--  taking  plaquenil   OA (osteoarthritis)    OSA (obstructive sleep apnea)    does not use CPAP   Osteoporosis    PONV (postoperative nausea and vomiting)    and hard to wake   Prosthetic eye globe    bilateral   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 ;  s/p enucleation     Past Surgical History:  Procedure Laterality Date   ABDOMINAL HYSTERECTOMY  1980s   AND APPENDECTOMY   ENUCLEATION Right 1984   right eye   EVISCERATION Left 1996   left eye   LAPAROSCOPIC CHOLECYSTECTOMY  1993   SHOULDER ARTHROSCOPY WITH SUBACROMIAL DECOMPRESSION Right 09-15-2007   @[facility]    AND BURSECTOMY/  DEBRIDEMENT ROTATOR CUFF   TEMPOROMANDIBULAR JOINT SURGERY Bilateral 1989   THYROIDECTOMY Left 11/07/2018   Procedure: LEFT HEMI THYROIDECTOMY;  Surgeon: Newman Pies, MD;  Location: [facility];  Service: ENT;  Laterality: Left;   TONSILLECTOMY  child    Family Psychiatric History: See below  Family History:  Family History  Problem Relation Age of Onset   Colon cancer Mother    Parkinson's disease Mother    Depression Maternal Aunt    Colon cancer Maternal Aunt    Depression Paternal Aunt    Alcohol abuse Paternal Uncle    Drug abuse Paternal Uncle    Parkinson's disease Maternal Grandfather    Depression Cousin    Colon cancer Other     Social History:  Social History   Socioeconomic History   Marital status: Married    Spouse name: Not on file   Number of children: 0   Years of education: 18   Highest education level: Not on file  Occupational History   Occupation: Retired  Tobacco Use   Smoking status: Former    Current packs/day: 0.00    Types: Cigarettes    Start date: 01/03/1972    Quit date: 01/03/1992    Years since quitting: 30.7   Smokeless tobacco: Never  Vaping Use   Vaping status: Never Used  Substance and Sexual Activity   Alcohol use: No    Alcohol/week: 0.0 standard drinks of alcohol   Drug use: Never   Sexual activity: Yes    Birth control/protection: Surgical  Other Topics Concern   Not on file  Social History Narrative   POA-Dennis   Caffeine use: daily (1 soda per day, 1 coffee per day)   Left handed    Legally blind      Worked as LPN/RN for about 8 years   Social Determinants of Corporate investment banker Strain: Not on file  Food Insecurity: Not on file  Transportation Needs: Not on file  Physical Activity: Not on file  Stress: Not on file  Social Connections: Not on file    Allergies:  Allergies  Allergen Reactions   Bacitracin Rash   Neomycin Rash   Sulfa Antibiotics Swelling and  Rash   Sulfamethoxazole Swelling and Other (See Comments)   Egg-Derived Products Diarrhea and Nausea And Vomiting    Stomach cramps   Elavil [Amitriptyline Hcl] Other (See Comments)    Really bad tremors   Gabapentin Other (See Comments)    Balance issues   Mixed Ragweed     Nasal congestion   Prozac [Fluoxetine Hcl]     Severe headache    Metabolic Disorder Labs: No results found for: "HGBA1C", "MPG" No results found for: "PROLACTIN" Lab  Results  Component Value Date   CHOL 203 (H) 11/18/2017   TRIG 227 (H) 11/18/2017   HDL 51 11/18/2017   CHOLHDL 4.0 11/18/2017   LDLCALC 107 (H) 11/18/2017   Lab Results  Component Value Date   TSH 1.940 03/24/2021   TSH 2.940 03/02/2018    Therapeutic Level Labs: No results found for: "LITHIUM" No results found for: "VALPROATE" Lab Results  Component Value Date   CBMZ 4.0 01/16/2021   CBMZ 6.1 11/14/2019    Current Medications: Current Outpatient Medications  Medication Sig Dispense Refill   ALPRAZolam (XANAX) 1 MG tablet Take 1 tablet (1 mg total) by mouth daily as needed. for anxiety 30 tablet 2   CARBATROL 200 MG 12 hr capsule TAKE ONE CAPSULE BY MOUTH ONCE DAILY 90 capsule 3   Cholecalciferol (VITAMIN D3 PO) Take 1 tablet by mouth daily.     DEXILANT 60 MG capsule TAKE ONE CAPSULE BY MOUTH DAILY; ON AN EMPTY STOMACH, THEN DO NOT EAT FOR ONE HOUR. (Patient taking differently: Take 60 mg by mouth daily before breakfast.) 30 capsule 5   DULoxetine (CYMBALTA) 60 MG capsule Take 1 capsule (60 mg total) by mouth 2 (two) times daily. 180 capsule 3   HYDROcodone-acetaminophen (NORCO/VICODIN) 5-325 MG tablet Take 1 tablet by mouth every 6 (six) hours as needed for moderate pain.     hydroxychloroquine (PLAQUENIL) 200 MG tablet Take 1 tablet (200 mg total) by mouth 2 (two) times daily. (Patient taking differently: Take 100 mg by mouth 2 (two) times daily. Take four 2 tablets by mouth with food or milk once daily.) 180 tablet 1   MAXALT  10 MG tablet Take 1 tablet (10 mg total) by mouth as needed. (Patient taking differently: Take 10 mg by mouth 3 (three) times daily as needed for migraine.) 10 tablet 5   metoprolol succinate (TOPROL-XL) 100 MG 24 hr tablet Take 1 tablet (100 mg total) by mouth every evening. (Patient taking differently: Take 100 mg by mouth every evening.) 90 tablet 0   NARCAN 4 MG/0.1ML LIQD nasal spray kit Place 1 spray into the nose as needed (accidental overdose.).     NIACIN PO Take by mouth daily.     ondansetron (ZOFRAN) 4 MG tablet Take 4 mg by mouth every 8 (eight) hours as needed for nausea or vomiting.     primidone (MYSOLINE) 50 MG tablet Take 1 tablet (50 mg total) by mouth at bedtime. 30 tablet 2   VITAMIN D PO Take 1 tablet by mouth daily.     No current facility-administered medications for this visit.     Musculoskeletal: Strength & Muscle Tone: na Gait & Station: na Patient leans: N/A  Psychiatric Specialty Exam: Review of Systems  Musculoskeletal:  Positive for arthralgias.  All other systems reviewed and are negative.   There were no vitals taken for this visit.There is no height or weight on file to calculate BMI.  General Appearance: NA  Eye Contact:  NA  Speech:  Clear and Coherent  Volume:  Normal  Mood:  Euthymic  Affect:  Congruent  Thought Process:  Goal Directed  Orientation:  Full (Time, Place, and Person)  Thought Content: WDL   Suicidal Thoughts:  No  Homicidal Thoughts:  No  Memory:  Immediate;   Good Recent;   Good Remote;   Good  Judgement:  Good  Insight:  Good  Psychomotor Activity:  Normal  Concentration:  Concentration: Good and Attention Span: Good  Recall:  Good  Fund of Knowledge: Good  Language: Good  Akathisia:  No  Handed:  Right  AIMS (if indicated): not done  Assets:  Communication Skills Desire for Improvement Resilience Social Support Talents/Skills  ADL's:  Intact  Cognition: WNL  Sleep:  Good   Screenings: GAD-7    Nurse, learning disability from 03/30/2019 in [facility] at [employer]  Total GAD-7 Score 2      PHQ2-9    Flowsheet Row Video Visit from 03/31/2022 in [facility] at [place] Video Visit from 09/30/2021 in [facility] at [place] Counselor from 07/16/2021 in [facility] at [place] Video Visit from 07/01/2021 in [facility] at [place] Video Visit from 03/31/2021 in [facility] at [facility] Total Score 0 0 1 0 0      Flowsheet Row Video Visit from 03/31/2022 in [facility] at [place] Video Visit from 09/30/2021 in [facility] at [place] Counselor from 07/16/2021 in [facility] at [place]  C-SSRS RISK CATEGORY No Risk No Risk No Risk        Assessment and Plan: This patient is a 77 year old female with history depression anxiety and PTSD.  She continues to do well on her current regimen.  She will continue Cymbalta 60 mg twice daily for fibromyalgia depression, Xanax 1 mg up to twice daily only as needed for anxiety and Mysoline 25 to 50 mg at bedtime for shakiness.  She will return to see me in 3 months  Collaboration of Care: Collaboration of Care: Referral or follow-up with counselor/therapist AEB patient will continue therapy with Florencia Reasons in our office  Patient/Guardian was advised Release of Information must be obtained prior to any record release in order to collaborate their care with an outside provider. Patient/Guardian was advised if they have not already done so to contact the registration department to sign all necessary forms in order for [place] to release information regarding their care.   Consent: Patient/Guardian gives verbal consent for treatment and assignment of benefits for services provided during this visit.  Patient/Guardian expressed understanding and agreed to proceed.    Diannia Ruder, MD 10/13/2022, 9:41 AM

## 2022-10-15 ENCOUNTER — Encounter: Payer: Self-pay | Admitting: Neurology

## 2022-10-15 ENCOUNTER — Ambulatory Visit
Admission: RE | Admit: 2022-10-15 | Discharge: 2022-10-15 | Disposition: A | Discharge status: Home / Self Care | Payer: Medicare Other | Source: Ambulatory Visit | Attending: Neurology | Admitting: Neurology

## 2022-10-15 ENCOUNTER — Ambulatory Visit (INDEPENDENT_AMBULATORY_CARE_PROVIDER_SITE_OTHER): Payer: Medicare Other | Admitting: Neurology

## 2022-10-15 ENCOUNTER — Other Ambulatory Visit: Payer: Self-pay | Admitting: Neurology

## 2022-10-15 VITALS — BP 102/61 | HR 62 | Ht 64.0 in | Wt 150.5 lb

## 2022-10-15 DIAGNOSIS — R569 Unspecified convulsions: Secondary | ICD-10-CM

## 2022-10-15 DIAGNOSIS — M25512 Pain in left shoulder: Secondary | ICD-10-CM

## 2022-10-15 DIAGNOSIS — R251 Tremor, unspecified: Secondary | ICD-10-CM | POA: Diagnosis not present

## 2022-10-15 NOTE — Progress Notes (Addendum)
Chief Complaint  Patient presents with   Room 14    Pt is here with her Erica Bennett. Pt states that things have been going fine with her Tremors.       ASSESSMENT AND PLAN  [name] is a 77 y.o. female   Bilateral hands tremor,  Low-dose primidone has been helpful, previous thyroid functional test was normal  History of partial seizure,  Last seizure was many years ago, she ran out of her Carbatrol since July 2024,  Repeat EEG, if there is no significant abnormality will not resume antiepileptic medications   DIAGNOSTIC DATA (LABS, IMAGING, TESTING) - I reviewed patient records, labs, notes, testing and imaging myself where available.  x-ray of left shoulder from [facility] imaging October 15, 2022 showed no acute abnormality  MEDICAL HISTORY:  Erica Bennett is a 77 year old female, was a patient of Dr. Anne Hahn in the past, today she is accompanied by her friend Erskine Squibb for evaluation of worsening tremor  I reviewed and summarized the referring note. PMHx. Partial seizure, Carbatrol 200mg  qhs, last seizure was in 2005. Mixed connective tissue disease. Chronic migraine, Maxalt prn Chronic low back pain,  Chronic insomnia Anxiety  She was last seen by Maralyn Sago in December 2022, she had a history of partial seizure, has been on brand-name Carbatrol 200 mg daily for many years, has not had recurrent seizure for many years  She has suffered significant anxiety when her husband committed suicide in front of her in April 2022,  She is blind for many years, mother suffered with rubella infection during pregnancy, she was born with bilateral retinal scarring, she become legally blind at age 18, then developed glaucoma, infection, required bilateral surgery  She denies a family history of tremor, around 2021, she noticed intermittent bilateral hands tremor, become much worse and since the incident of her husband and increased anxiety,  Her anxiety is under better control  now with treatment, but she still has intermittent bilateral hands tremor, sometimes lower extremity tremor, difficulty bearing weight,  She was put on primidone treatment since January 2023, not sure about the benefit  She still lives by herself with her cat, her friends drove her around  UPDATE Sept 19 2024: She is accompanied by her friend at today's visit, overall she is better, less anxious, continue to deal with intermittent bilateral hands tremor, helped by primidone 50 mg half tablet at night every night, prescriptions from primary care physician  She has been treated with brand Carbatrol 200 mg for many years with her history of partial seizure, in July, she ran out of medication, has stopped taking it ever since, no recurrent event,  She lives alone, friend drove her around  PHYSICAL EXAM:   Vitals:   10/15/22 1448  BP: 102/61  Pulse: 62  Weight: 150 lb 8 oz (68.3 kg)  Height: 5\' 4"  (1.626 m)   Not recorded     Body mass index is 25.83 kg/m.  PHYSICAL EXAMNIATION:  Gen: NAD, conversant, well nourised, well groomed                     Cardiovascular: Regular rate rhythm, no peripheral edema, warm, nontender. Eyes: Conjunctivae clear without exudates or hemorrhage Neck: Supple, no carotid bruits. Pulmonary: Clear to auscultation bilaterally   NEUROLOGICAL EXAM:  MENTAL STATUS: Speech/cognition: Awake, alert, oriented to history taking and casual conversation   CRANIAL NERVES: CN II: Bilateral prosthetic eye CN III, IV, VI: extraocular movement are normal.  No ptosis. CN V: Facial sensation is intact to light touch CN VII: Face is symmetric with normal eye closure  CN VIII: Hearing is normal to causal conversation. CN IX, X: Phonation is normal. CN XI: Head turning and shoulder shrug are intact  MOTOR: Bilateral upper extremity motor strength is normal, no significant action tremor, no rigidity or bradykinesia  REFLEXES: Reflexes are 2+ and symmetric at  the biceps, triceps, knees, and ankles. Plantar responses are flexor.  SENSORY: Intact to light touch, pinprick and vibratory sensation are intact in fingers and toes.  COORDINATION: There is no trunk or limb dysmetria noted.  GAIT/STANCE: Posture is normal. Gait is steady   REVIEW OF SYSTEMS:  Full 14 system review of systems performed and notable only for as above All other review of systems were negative.   ALLERGIES: Allergies  Allergen Reactions   Bacitracin Rash   Neomycin Rash   Sulfa Antibiotics Swelling and Rash   Sulfamethoxazole Swelling and Other (See Comments)   Egg-Derived Products Diarrhea and Nausea And Vomiting    Stomach cramps   Elavil [Amitriptyline Hcl] Other (See Comments)    Really bad tremors   Gabapentin Other (See Comments)    Balance issues   Mixed Ragweed     Nasal congestion   Prozac [Fluoxetine Hcl]     Severe headache    HOME MEDICATIONS: Current Outpatient Medications  Medication Sig Dispense Refill   ALPRAZolam (XANAX) 1 MG tablet Take 1 tablet (1 mg total) by mouth daily as needed. for anxiety 30 tablet 2   Cholecalciferol (VITAMIN D3 PO) Take 1 tablet by mouth daily.     DEXILANT 60 MG capsule TAKE ONE CAPSULE BY MOUTH DAILY; ON AN EMPTY STOMACH, THEN DO NOT EAT FOR ONE HOUR. (Patient taking differently: Take 60 mg by mouth daily before breakfast.) 30 capsule 5   DULoxetine (CYMBALTA) 60 MG capsule Take 1 capsule (60 mg total) by mouth 2 (two) times daily. 180 capsule 3   HYDROcodone-acetaminophen (NORCO/VICODIN) 5-325 MG tablet Take 1 tablet by mouth every 6 (six) hours as needed for moderate pain.     hydroxychloroquine (PLAQUENIL) 200 MG tablet Take 1 tablet (200 mg total) by mouth 2 (two) times daily. (Patient taking differently: Take 100 mg by mouth 2 (two) times daily. Take four 2 tablets by mouth with food or milk once daily.) 180 tablet 1   MAXALT 10 MG tablet Take 1 tablet (10 mg total) by mouth as needed. (Patient taking  differently: Take 10 mg by mouth 3 (three) times daily as needed for migraine.) 10 tablet 5   metoprolol succinate (TOPROL-XL) 100 MG 24 hr tablet Take 1 tablet (100 mg total) by mouth every evening. (Patient taking differently: Take 100 mg by mouth every evening.) 90 tablet 0   NIACIN PO Take by mouth daily.     ondansetron (ZOFRAN) 4 MG tablet Take 4 mg by mouth every 8 (eight) hours as needed for nausea or vomiting.     primidone (MYSOLINE) 50 MG tablet Take 1 tablet (50 mg total) by mouth at bedtime. 30 tablet 2   VITAMIN D PO Take 1 tablet by mouth daily.     CARBATROL 200 MG 12 hr capsule TAKE ONE CAPSULE BY MOUTH ONCE DAILY (Patient not taking: Reported on 10/15/2022) 90 capsule 3   NARCAN 4 MG/0.1ML LIQD nasal spray kit Place 1 spray into the nose as needed (accidental overdose.). (Patient not taking: Reported on 10/15/2022)     No current facility-administered  medications for this visit.    PAST MEDICAL HISTORY: Past Medical History:  Diagnosis Date   Anxiety    Blindness of both eyes    due to degenerative retina and acute glaucoma   Complication of anesthesia    pt has limited mouth opening due to bilateral tmj surgery   Fecal incontinence    Fibromyalgia    Generalized seizure disorder [facility])    neurologist-- dr Anne Hahn--  seizure is staring off and dropping things  (12-20-2019  per pt last seizure approx. 2005)   GERD (gastroesophageal reflux disease)    H/O Hashimoto thyroiditis    History of acute angle-closure glaucoma    bilateral eyes  s/p eye removal, now has prosthesis   History of hypertension    History of skin cancer    History of thyroid nodule    bx left thyroid nodule 11-17-2017;  s/p left thyroidectomy 11-07-2018, benign mass   Hyperlipidemia    Limitation of opening of mouth    per pt due to bilateral tmj surgery 1989   MDD (major depressive disorder)    Migraines    Mixed connective tissue disease (HCC)    rheumotologist-- dr Dierdre Forth--  taking plaquenil    OA (osteoarthritis)    OSA (obstructive sleep apnea)    does not use CPAP   Osteoporosis    PONV (postoperative nausea and vomiting)    and hard to wake   Prosthetic eye globe    bilateral   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 ;  s/p enucleation     PAST SURGICAL HISTORY: Past Surgical History:  Procedure Laterality Date   ABDOMINAL HYSTERECTOMY  1980s   AND APPENDECTOMY   ENUCLEATION Right 1984   right eye   EVISCERATION Left 1996   left eye   LAPAROSCOPIC CHOLECYSTECTOMY  1993   SHOULDER ARTHROSCOPY WITH SUBACROMIAL DECOMPRESSION Right 09-15-2007  @[facility]    AND BURSECTOMY/  DEBRIDEMENT ROTATOR CUFF   TEMPOROMANDIBULAR JOINT SURGERY Bilateral 1989   THYROIDECTOMY Left 11/07/2018   Procedure: LEFT HEMI THYROIDECTOMY;  Surgeon: Newman Pies, MD;  Location: [facility];  Service: ENT;  Laterality: Left;   TONSILLECTOMY  child    FAMILY HISTORY: Family History  Problem Relation Age of Onset   Colon cancer Mother    Parkinson's disease Mother    Depression Maternal Aunt    Colon cancer Maternal Aunt    Depression Paternal Aunt    Alcohol abuse Paternal Uncle    Drug abuse Paternal Uncle    Parkinson's disease Maternal Grandfather    Depression Cousin    Colon cancer Other     SOCIAL HISTORY: Social History   Socioeconomic History   Marital status: Married    Spouse name: Not on file   Number of children: 0   Years of education: 18   Highest education level: Not on file  Occupational History   Occupation: Retired  Tobacco Use   Smoking status: Former    Current packs/day: 0.00    Types: Cigarettes    Start date: 01/03/1972    Quit date: 01/03/1992    Years since quitting: 30.8   Smokeless tobacco: Never  Vaping Use   Vaping status: Never Used  Substance and Sexual Activity   Alcohol use: No    Alcohol/week: 0.0 standard drinks of alcohol   Drug use: Never   Sexual activity: Yes    Birth  control/protection: Surgical  Other  Topics Concern   Not on file  Social History Narrative   POA-Dennis   Caffeine use: daily (1 soda per day, 1 coffee per day)   Left handed    Legally blind      Worked as LPN/RN for about 8 years   Social Determinants of Corporate investment banker Strain: Not on file  Food Insecurity: Not on file  Transportation Needs: Not on file  Physical Activity: Not on file  Stress: Not on file  Social Connections: Not on file  Intimate Partner Violence: Unknown (06/21/2019)   [facility]   Humiliation, Afraid, Rape, and Kick questionnaire    Fear of Current or Ex-Partner: No    Emotionally Abused: No    Physically Abused: No    Sexually Abused: Not on file      Levert Feinstein, M.D. Ph.D.  [facility] [address] [place], [place] [address] Ph: [phone] Fax: [phone]  CC:  No referring provider defined for this encounter.  Patient, No Pcp Per

## 2022-10-23 ENCOUNTER — Ambulatory Visit (INDEPENDENT_AMBULATORY_CARE_PROVIDER_SITE_OTHER): Payer: Medicare Other | Admitting: Psychiatry

## 2022-10-23 DIAGNOSIS — F321 Major depressive disorder, single episode, moderate: Secondary | ICD-10-CM

## 2022-10-23 NOTE — Progress Notes (Signed)
Virtual Visit via Telephone Note  I connected with Erica Bennett on 10/23/22 at 10:07 AM EDT  by telephone and verified that I am speaking with the correct person using two identifiers.  Location: Patient: Home Provider: [facility] [place] office    I discussed the limitations, risks, security and privacy concerns of performing an evaluation and management service by telephone and the availability of in person appointments. I also discussed with the patient that there may be a patient responsible charge related to this service. The patient expressed understanding and agreed to proceed.        I provided 40 minutes of non-face-to-face time during this encounter.   [name], LCSW                THERAPIST PROGRESS NOTE          Session Time:  Friday 10/23/2022 10:07 AM  - 10:47 AM   Participation Level: Active  Behavioral Response: Alert, euthymic  Type of Therapy: Individual Therapy  Treatment Goals addressed:  Reduce frequency, intensity, and duration of PTSD symptoms so daily functioning is improved: AEB reducing intensity and frequency of episodes of  shaking to 1 day per week for 60 days Erica Bennett WILL PRACTICE EMOTION REGULATION SKILLS 5 PER WEEK FOR THE NEXT  12 WEEKS ays, reduce intensity of startle reactions by 50% per pt's report.  Progress on Goals: progressing       Interventions: CBT and Supportive  Summary: Erica Bennett is a 76 y.o. female who  is self -referred and is a returning patient to this clinician. She denies any psychiatric hospitalizations. She continues to see psychiatrist Dr. Tenny Craw  for medicatiion management.  Patient is resuming services due to grief and loss issues, worry, anxiety, and intrusive recollections of husband's death.  Her husband died by suicide in 06-07-20. He shot himself in the head in patient's presence.  Patient is blind and reports she had to touch the entry and exit areas made by the bullet in order to give  information to 911 dispatcher when she called for help for husband.       Patient last was seen via virtual visit about 2 weeks ago.   She reports still doing well since last session.   She continues to deny any symptoms of depression, experiencing any flashbacks or intrusive memories since last session.  She continues to report doing well without seizure medication.  She recently saw a neurologist who advised her she can remain off seizure medication per patient's report.  Patient continues to practice relaxation techniques and also has been practicing mindfulness activities.  She remains pleased with her progress in treatment.  She expresses confidence in her use of coping skills.    Suicidal/Homicidal: Nowithout intent/plan       Therapist Response:, reviewed symptoms, praised and reinforced patient's efforts to practice relaxation techniques and maintain normal involvement in activities, praised and reinforced patient's efforts to practice mindfulness, discussed effects, assisted patient develop mental health maintenance plan, did termination, encouraged patient to contact this practice should she need psychotherapy services in the future, encouraged patient to continue seeing his psychiatrist Dr. Tenny Craw for medication management    plan return in 2 weeks                   Diagnosis: Axis I: Major Depression, single episode    Axis II: No diagnosis  Collaboration of Care: Psychiatrist AEB  patient works with psychiatrist Dr. Tenny Craw.  ,  Clinician reviewing chart  Patient/Guardian was advised Release of Information must be obtained prior to any record release in order to collaborate their care with an outside provider. Patient/Guardian was advised if they have not already done so to contact the registration department to sign all necessary forms in order for [place] to release information regarding their care.   Consent: Patient/Guardian gives verbal consent for treatment and assignment of benefits for  services provided during this visit. Patient/Guardian expressed understanding and agreed to proceed.   [name], LCSW 10/23/2022    Outpatient Therapist Discharge Summary  Erica Bennett    05/03/1945   Admission Date:  07/17/2020 Discharge Date:  10/23/2022 Reason for Discharge: Treatment goals accomplished Diagnosis:  Axis I:  Moderate single current episode of major depressive disorder [facility])  Comments: Patient will continue to see his psychiatrist Dr. Tenny Craw for medication management.  Patient is encouraged to contact this practice should she need psychotherapy services in the future.  Erica Bennett E Zoeie Ritter LCSW

## 2022-10-26 ENCOUNTER — Telehealth: Payer: Self-pay | Admitting: Neurology

## 2022-10-26 NOTE — Telephone Encounter (Signed)
Pt is asking to be called with results to CT scan, pt questioning if she needs Cortizone injections, please call.

## 2022-10-26 NOTE — Telephone Encounter (Signed)
I saw her in September 2024,  I did not order CT scan, advised her to contact ordering physician for information and decision about cortisone shot

## 2022-11-10 ENCOUNTER — Ambulatory Visit (INDEPENDENT_AMBULATORY_CARE_PROVIDER_SITE_OTHER): Payer: Medicare Other | Admitting: Neurology

## 2022-11-10 DIAGNOSIS — R569 Unspecified convulsions: Secondary | ICD-10-CM

## 2022-11-10 DIAGNOSIS — R251 Tremor, unspecified: Secondary | ICD-10-CM | POA: Diagnosis not present

## 2022-11-19 ENCOUNTER — Telehealth: Payer: Self-pay | Admitting: Neurology

## 2022-11-19 NOTE — Telephone Encounter (Signed)
LVM at 10:57 am calling check on EEG results. Have not heard from anyone.

## 2022-11-19 NOTE — Telephone Encounter (Signed)
PHONE ROOM: please tell pt they haven't been viewed by doctor yet and once they review them and make recommendations we will be reaching out.  Thanks,  Production assistant, radio

## 2022-11-23 NOTE — Telephone Encounter (Signed)
Pt called, the response from [facility], CMA was relayed to pt, this is FYI no call back requested.

## 2022-12-05 NOTE — Procedures (Signed)
   HISTORY: 77 year old female with long history of partial seizure, recurrent seizure was many years ago  TECHNIQUE:  This is a routine 16 channel EEG recording with one channel devoted to a limited EKG recording.  It was performed during wakefulness, drowsiness and asleep.  Hyperventilation and photic stimulation were performed as activating procedures.  There are minimum muscle and movement artifact noted.  Upon maximum arousal, posterior dominant waking rhythm consistent of rhythmic alpha range activity. Activities are symmetric over the bilateral posterior derivations and attenuated with eye opening.  There was intermittent slowing involving T8, P8 leads,  Photic stimulation did not alter the tracing.  Hyperventilation produced mild/moderate buildup with higher amplitude and the slower activities noted.  During EEG recording, patient developed drowsiness and no deeper stage of sleep was achieved  During EEG recording, there was no epileptiform discharge noted.  EKG demonstrate normal sinus rhythm.  CONCLUSION: This is a mild abnormal EEG.  There is no electrodiagnostic evidence of epileptiform discharge, but there was intermittent slowing involving P8, T8 leads, indicating right temporoparietal region irritability.  Levert Feinstein, M.D. Ph.D.  [facility] [address] [place], [place] [address] Phone: [phone] Fax:      [phone]

## 2022-12-08 NOTE — Telephone Encounter (Signed)
Called and left a detailed VM for patient per [facility]

## 2022-12-08 NOTE — Telephone Encounter (Signed)
Pt calling to check on status of EEG results. Have been a month since having the EEG done. Would like a call back.

## 2022-12-08 NOTE — Telephone Encounter (Signed)
Please call patient, EEG only showed slight abnormality, there are some focal irritability involving right temporoparietal region  If she remains seizure-free without her Carbatrol, it is okay for her to remain the same way.  She is to call office if she has any spells suspicious for seizure  CONCLUSION: This is a mild abnormal EEG.  There is no electrodiagnostic evidence of epileptiform discharge, but there was intermittent slowing involving P8, T8 leads, indicating right temporoparietal region irritability.

## 2023-01-12 ENCOUNTER — Telehealth (HOSPITAL_COMMUNITY): Payer: Medicare Other | Admitting: Psychiatry

## 2023-01-12 ENCOUNTER — Encounter (HOSPITAL_COMMUNITY): Payer: Self-pay | Admitting: Psychiatry

## 2023-01-12 DIAGNOSIS — F4310 Post-traumatic stress disorder, unspecified: Secondary | ICD-10-CM | POA: Diagnosis not present

## 2023-01-12 DIAGNOSIS — F321 Major depressive disorder, single episode, moderate: Secondary | ICD-10-CM

## 2023-01-12 MED ORDER — ALPRAZOLAM 1 MG PO TABS: 1.0000 mg | ORAL_TABLET | Freq: Two times a day (BID) | ORAL | 2 refills | Status: DC | PRN

## 2023-01-12 MED ORDER — PRIMIDONE 50 MG PO TABS: 50.0000 mg | ORAL_TABLET | Freq: Every day | ORAL | 2 refills | Status: DC

## 2023-01-12 MED ORDER — VENLAFAXINE HCL ER 75 MG PO CP24: 75.0000 mg | ORAL_CAPSULE | Freq: Every day | ORAL | 2 refills | Status: DC

## 2023-01-12 MED ORDER — DULOXETINE HCL 60 MG PO CPEP: 60.0000 mg | ORAL_CAPSULE | Freq: Two times a day (BID) | ORAL | 3 refills | Status: DC

## 2023-01-12 NOTE — Progress Notes (Signed)
Virtual Visit via Telephone Note  I connected with Erica Bennett on 01/12/23 at  9:00 AM EST by telephone and verified that I am speaking with the correct person using two identifiers.  Location: Patient: home Provider: office   I discussed the limitations, risks, security and privacy concerns of performing an evaluation and management service by telephone and the availability of in person appointments. I also discussed with the patient that there may be a patient responsible charge related to this service. The patient expressed understanding and agreed to proceed.      I discussed the assessment and treatment plan with the patient. The patient was provided an opportunity to ask questions and all were answered. The patient agreed with the plan and demonstrated an understanding of the instructions.   The patient was advised to call back or seek an in-person evaluation if the symptoms worsen or if the condition fails to improve as anticipated.  I provided 20 minutes of non-face-to-face time during this encounter.   Diannia Ruder, MD  [facility] MD/PA/NP OP Progress Note  01/12/2023 9:29 AM Erica Bennett  MRN:  [id number]  Chief Complaint:  Chief Complaint  Patient presents with   Depression   Anxiety   Follow-up   HPI: This patient is a 77 year old widowed white female who lives alone in [place].  She has no children.  She is on disability for blindness.  The patient returns for follow-up after 3 months regarding her depression anxiety and PTSD.  She states that she has been more depressed recently.  Her cat she had for 17 years died in early 01/08/23.  Since then she has been more sad and low.  Her energy has dropped and she does not feel like doing things as much with people.  She is still sleeping fairly well.  She is eating okay but has lost some weight.  She denies any thoughts of suicide.  I suggested adding a bit of Effexor XR to her regimen and she is in agreement. Visit  Diagnosis:    ICD-10-CM   1. Moderate single current episode of major depressive disorder (HCC)  F32.1     2. PTSD (post-traumatic stress disorder)  F43.10       Past Psychiatric History: Prior outpatient treatment  Past Medical History:  Past Medical History:  Diagnosis Date   Anxiety    Blindness of both eyes    due to degenerative retina and acute glaucoma   Complication of anesthesia    pt has limited mouth opening due to bilateral tmj surgery   Fecal incontinence    Fibromyalgia    Generalized seizure disorder [facility])    neurologist-- dr Anne Hahn--  seizure is staring off and dropping things  (12-20-2019  per pt last seizure approx. 2005)   GERD (gastroesophageal reflux disease)    H/O Hashimoto thyroiditis    History of acute angle-closure glaucoma    bilateral eyes  s/p eye removal, now has prosthesis   History of hypertension    History of skin cancer    History of thyroid nodule    bx left thyroid nodule 11-17-2017;  s/p left thyroidectomy 11-07-2018, benign mass   Hyperlipidemia    Limitation of opening of mouth    per pt due to bilateral tmj surgery 1989   MDD (major depressive disorder)    Migraines    Mixed connective tissue disease (HCC)    rheumotologist-- dr Dierdre Forth--  taking plaquenil   OA (osteoarthritis)    OSA (  obstructive sleep apnea)    does not use CPAP   Osteoporosis    PONV (postoperative nausea and vomiting)    and hard to wake   Prosthetic eye globe    bilateral   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 ;  s/p enucleation     Past Surgical History:  Procedure Laterality Date   ABDOMINAL HYSTERECTOMY  1980s   AND APPENDECTOMY   ENUCLEATION Right 1984   right eye   EVISCERATION Left 1996   left eye   LAPAROSCOPIC CHOLECYSTECTOMY  1993   SHOULDER ARTHROSCOPY WITH SUBACROMIAL DECOMPRESSION Right 09-15-2007  @[facility]    AND BURSECTOMY/  DEBRIDEMENT ROTATOR CUFF   TEMPOROMANDIBULAR JOINT  SURGERY Bilateral 1989   THYROIDECTOMY Left 11/07/2018   Procedure: LEFT HEMI THYROIDECTOMY;  Surgeon: Newman Pies, MD;  Location: [facility];  Service: ENT;  Laterality: Left;   TONSILLECTOMY  child    Family Psychiatric History: See below  Family History:  Family History  Problem Relation Age of Onset   Colon cancer Mother    Parkinson's disease Mother    Depression Maternal Aunt    Colon cancer Maternal Aunt    Depression Paternal Aunt    Alcohol abuse Paternal Uncle    Drug abuse Paternal Uncle    Parkinson's disease Maternal Grandfather    Depression Cousin    Colon cancer Other     Social History:  Social History   Socioeconomic History   Marital status: Married    Spouse name: Not on file   Number of children: 0   Years of education: 18   Highest education level: Not on file  Occupational History   Occupation: Retired  Tobacco Use   Smoking status: Former    Current packs/day: 0.00    Types: Cigarettes    Start date: 01/03/1972    Quit date: 01/03/1992    Years since quitting: 31.0   Smokeless tobacco: Never  Vaping Use   Vaping status: Never Used  Substance and Sexual Activity   Alcohol use: No    Alcohol/week: 0.0 standard drinks of alcohol   Drug use: Never   Sexual activity: Yes    Birth control/protection: Surgical  Other Topics Concern   Not on file  Social History Narrative   POA-[place]   Caffeine use: daily (1 soda per day, 1 coffee per day)   Left handed    Legally blind      Worked as LPN/RN for about 8 years   Social Drivers of Corporate investment banker Strain: Not on file  Food Insecurity: Not on file  Transportation Needs: Not on file  Physical Activity: Not on file  Stress: Not on file  Social Connections: Not on file    Allergies:  Allergies  Allergen Reactions   Bacitracin Rash   Neomycin Rash   Sulfa Antibiotics Swelling and Rash   Sulfamethoxazole Swelling and Other (See Comments)   Egg-Derived Products  Diarrhea and Nausea And Vomiting    Stomach cramps   Elavil [Amitriptyline Hcl] Other (See Comments)    Really bad tremors   Gabapentin Other (See Comments)    Balance issues   Mixed Ragweed     Nasal congestion   Prozac [Fluoxetine Hcl]     Severe headache    Metabolic Disorder Labs: No results found for: "HGBA1C", "MPG" No results found for: "PROLACTIN" Lab Results  Component Value Date   CHOL 203 (  H) 11/18/2017   TRIG 227 (H) 11/18/2017   HDL 51 11/18/2017   CHOLHDL 4.0 11/18/2017   LDLCALC 107 (H) 11/18/2017   Lab Results  Component Value Date   TSH 1.940 03/24/2021   TSH 2.940 03/02/2018    Therapeutic Level Labs: No results found for: "LITHIUM" No results found for: "VALPROATE" Lab Results  Component Value Date   CBMZ 4.0 01/16/2021   CBMZ 6.1 11/14/2019    Current Medications: Current Outpatient Medications  Medication Sig Dispense Refill   venlafaxine XR (EFFEXOR XR) 75 MG 24 hr capsule Take 1 capsule (75 mg total) by mouth daily. 30 capsule 2   ALPRAZolam (XANAX) 1 MG tablet Take 1 tablet (1 mg total) by mouth 2 (two) times daily as needed. for anxiety 60 tablet 2   CARBATROL 200 MG 12 hr capsule TAKE ONE CAPSULE BY MOUTH ONCE DAILY (Patient not taking: Reported on 10/15/2022) 90 capsule 3   Cholecalciferol (VITAMIN D3 PO) Take 1 tablet by mouth daily.     DEXILANT 60 MG capsule TAKE ONE CAPSULE BY MOUTH DAILY; ON AN EMPTY STOMACH, THEN DO NOT EAT FOR ONE HOUR. (Patient taking differently: Take 60 mg by mouth daily before breakfast.) 30 capsule 5   DULoxetine (CYMBALTA) 60 MG capsule Take 1 capsule (60 mg total) by mouth 2 (two) times daily. 180 capsule 3   HYDROcodone-acetaminophen (NORCO/VICODIN) 5-325 MG tablet Take 1 tablet by mouth every 6 (six) hours as needed for moderate pain.     hydroxychloroquine (PLAQUENIL) 200 MG tablet Take 1 tablet (200 mg total) by mouth 2 (two) times daily. (Patient taking differently: Take 100 mg by mouth 2 (two) times daily.  Take four 2 tablets by mouth with food or milk once daily.) 180 tablet 1   MAXALT 10 MG tablet Take 1 tablet (10 mg total) by mouth as needed. (Patient taking differently: Take 10 mg by mouth 3 (three) times daily as needed for migraine.) 10 tablet 5   metoprolol succinate (TOPROL-XL) 100 MG 24 hr tablet Take 1 tablet (100 mg total) by mouth every evening. (Patient taking differently: Take 100 mg by mouth every evening.) 90 tablet 0   NARCAN 4 MG/0.1ML LIQD nasal spray kit Place 1 spray into the nose as needed (accidental overdose.). (Patient not taking: Reported on 10/15/2022)     NIACIN PO Take by mouth daily.     ondansetron (ZOFRAN) 4 MG tablet Take 4 mg by mouth every 8 (eight) hours as needed for nausea or vomiting.     primidone (MYSOLINE) 50 MG tablet Take 1 tablet (50 mg total) by mouth at bedtime. 30 tablet 2   VITAMIN D PO Take 1 tablet by mouth daily.     No current facility-administered medications for this visit.     Musculoskeletal: Strength & Muscle Tone: na Gait & Station: na Patient leans: na  Psychiatric Specialty Exam: Review of Systems  There were no vitals taken for this visit.There is no height or weight on file to calculate BMI.  General Appearance: NA  Eye Contact:  NA  Speech:  Clear and Coherent  Volume:  Normal  Mood:  Dysphoric  Affect:  NA  Thought Process:  Goal Directed  Orientation:  Full (Time, Place, and Person)  Thought Content: Rumination   Suicidal Thoughts:  No  Homicidal Thoughts:  No  Memory:  Immediate;   Good Recent;   Good Remote;   Good  Judgement:  Good  Insight:  Good  Psychomotor Activity:  Decreased  Concentration:  Concentration: Fair and Attention Span: Fair  Recall:  [employer] of Knowledge: Good  Language: Good  Akathisia:  No  Handed:  Right  AIMS (if indicated): not done  Assets:  Communication Skills Desire for Improvement Resilience Social Support  ADL's:  Intact  Cognition: WNL  Sleep:  Good    Screenings: GAD-7    Flowsheet Row Counselor from 03/30/2019 in [facility] [place]  Total GAD-7 Score 2      PHQ2-9    Flowsheet Row Video Visit from 01/12/2023 in [facility] [place] Video Visit from 03/31/2022 in [facility] [place] Video Visit from 09/30/2021 in [facility] [place] Counselor from 07/16/2021 in [facility] [place] Video Visit from 07/01/2021 in [facility] Total Score 3 0 0 1 0  PHQ-9 Total Score 11 -- -- -- --      Flowsheet Row Video Visit from 01/12/2023 in [facility] [place] Video Visit from 03/31/2022 in [facility] at [place] Video Visit from 09/30/2021 in [facility] at [place]  C-SSRS RISK CATEGORY No Risk No Risk No Risk        Assessment and Plan: This patient is a 77 year old female with a history of depression anxiety and PTSD.  She has not been doing as well since her cat passed away.  She will continue Cymbalta 60 mg twice daily for fibromyalgia and depression but add Effexor XR 75 mg daily for depression.  She will continue Xanax 1 mg up to twice daily as needed for anxiety and Mysoline 50 mg in the evening for tremor as needed.  She will return to see me in 4 weeks  Collaboration of Care: Collaboration of Care: Referral or follow-up with counselor/therapist AEB patient will be referred back to [facility] in our office for therapy  Patient/Guardian was advised Release of Information must be obtained prior to any record release in order to collaborate their care with an outside provider. Patient/Guardian was advised if they have not already done so to contact the registration department to sign all necessary forms in order for [place] to release  information regarding their care.   Consent: Patient/Guardian gives verbal consent for treatment and assignment of benefits for services provided during this visit. Patient/Guardian expressed understanding and agreed to proceed.    Diannia Ruder, MD 01/12/2023, 9:29 AM

## 2023-02-09 ENCOUNTER — Encounter (HOSPITAL_COMMUNITY): Payer: Self-pay | Admitting: Psychiatry

## 2023-02-09 ENCOUNTER — Telehealth (INDEPENDENT_AMBULATORY_CARE_PROVIDER_SITE_OTHER): Payer: Medicare Other | Admitting: Psychiatry

## 2023-02-09 DIAGNOSIS — F4310 Post-traumatic stress disorder, unspecified: Secondary | ICD-10-CM

## 2023-02-09 DIAGNOSIS — F321 Major depressive disorder, single episode, moderate: Secondary | ICD-10-CM | POA: Diagnosis not present

## 2023-02-09 MED ORDER — VENLAFAXINE HCL ER 75 MG PO CP24: 75.0000 mg | ORAL_CAPSULE | Freq: Every day | ORAL | 2 refills | Status: DC

## 2023-02-09 MED ORDER — ALPRAZOLAM 1 MG PO TABS: 1.0000 mg | ORAL_TABLET | Freq: Two times a day (BID) | ORAL | 2 refills | Status: DC | PRN

## 2023-02-09 MED ORDER — DULOXETINE HCL 60 MG PO CPEP: 60.0000 mg | ORAL_CAPSULE | Freq: Two times a day (BID) | ORAL | 3 refills | Status: DC

## 2023-02-09 MED ORDER — PRIMIDONE 50 MG PO TABS: 50.0000 mg | ORAL_TABLET | Freq: Every day | ORAL | 2 refills | Status: DC

## 2023-02-09 NOTE — Progress Notes (Signed)
 Virtual Visit via Telephone Note  I connected with Erica Bennett on 02/09/23 at  9:00 AM EST by telephone and verified that I am speaking with the correct person using two identifiers.  Location: Patient: home Provider: office   I discussed the limitations, risks, security and privacy concerns of performing an evaluation and management service by telephone and the availability of in person appointments. I also discussed with the patient that there may be a patient responsible charge related to this service. The patient expressed understanding and agreed to proceed.      I discussed the assessment and treatment plan with the patient. The patient was provided an opportunity to ask questions and all were answered. The patient agreed with the plan and demonstrated an understanding of the instructions.   The patient was advised to call back or seek an in-person evaluation if the symptoms worsen or if the condition fails to improve as anticipated.  I provided 20 minutes of non-face-to-face time during this encounter.   Barnie Gull, MD  [facility] MD/PA/NP OP Progress Note  02/09/2023 9:21 AM Erica Bennett  MRN:  [id number]  Chief Complaint:  Chief Complaint  Patient presents with   Anxiety   Depression   Follow-up   HPI: This patient is a 78 year old widowed white female who lives alone in [place]. She has no children. She is on disability for blindness.  The patient returns for follow-up after 4 weeks regarding her depression anxiety and PTSD.  Last time she reported being more depressed because her cat of 17 years died in 2023/12/26.  We did add venlafaxine  XR 75 mg to her regimen and she states she is feeling much less depressed.  She has not been feeling well physically.  She had the COVID-vaccine 2 weeks ago and it is caused a lot of side effects-body aches chills fever and disruption of her sleep cycle.  She states that yesterday was the first day she began to feel normal again.  However  mood wise she is doing quite well and denies significant depression anxiety thoughts of self-harm.  Her sleep cycle is still somewhat disrupted due to blindness but she is getting at least 5 hours a night which she claims is good for her.  Visit Diagnosis:    ICD-10-CM   1. Moderate single current episode of major depressive disorder (HCC)  F32.1     2. PTSD (post-traumatic stress disorder)  F43.10       Past Psychiatric History: Prior outpatient treatment  Past Medical History:  Past Medical History:  Diagnosis Date   Anxiety    Blindness of both eyes    due to degenerative retina and acute glaucoma   Complication of anesthesia    pt has limited mouth opening due to bilateral tmj surgery   Fecal incontinence    Fibromyalgia    Generalized seizure disorder [facility])    neurologist-- dr jenel--  seizure is staring off and dropping things  (12-20-2019  per pt last seizure approx. 2005)   GERD (gastroesophageal reflux disease)    H/O Hashimoto thyroiditis    History of acute angle-closure glaucoma    bilateral eyes  s/p eye removal, now has prosthesis   History of hypertension    History of skin cancer    History of thyroid  nodule    bx left thyroid  nodule 11-17-2017;  s/p left thyroidectomy 11-07-2018, benign mass   Hyperlipidemia    Limitation of opening of mouth    per pt due  to bilateral tmj surgery 1989   MDD (major depressive disorder)    Migraines    Mixed connective tissue disease (HCC)    rheumotologist-- dr mai--  taking plaquenil    OA (osteoarthritis)    OSA (obstructive sleep apnea)    does not use CPAP   Osteoporosis    PONV (postoperative nausea and vomiting)    and hard to wake   Prosthetic eye globe    bilateral   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 ;  s/p enucleation     Past Surgical History:  Procedure Laterality Date   ABDOMINAL HYSTERECTOMY  1980s   AND APPENDECTOMY   ENUCLEATION Right  1984   right eye   EVISCERATION Left 1996   left eye   LAPAROSCOPIC CHOLECYSTECTOMY  1993   SHOULDER ARTHROSCOPY WITH SUBACROMIAL DECOMPRESSION Right 09-15-2007  @[facility]    AND BURSECTOMY/  DEBRIDEMENT ROTATOR CUFF   TEMPOROMANDIBULAR JOINT SURGERY Bilateral 1989   THYROIDECTOMY Left 11/07/2018   Procedure: LEFT HEMI THYROIDECTOMY;  Surgeon: Karis Clunes, MD;  Location: [facility];  Service: ENT;  Laterality: Left;   TONSILLECTOMY  child    Family Psychiatric History: See below  Family History:  Family History  Problem Relation Age of Onset   Colon cancer Mother    Parkinson's disease Mother    Depression Maternal Aunt    Colon cancer Maternal Aunt    Depression Paternal Aunt    Alcohol abuse Paternal Uncle    Drug abuse Paternal Uncle    Parkinson's disease Maternal Grandfather    Depression Cousin    Colon cancer Other     Social History:  Social History   Socioeconomic History   Marital status: Married    Spouse name: Not on file   Number of children: 0   Years of education: 18   Highest education level: Not on file  Occupational History   Occupation: Retired  Tobacco Use   Smoking status: Former    Current packs/day: 0.00    Types: Cigarettes    Start date: 01/03/1972    Quit date: 01/03/1992    Years since quitting: 31.1   Smokeless tobacco: Never  Vaping Use   Vaping status: Never Used  Substance and Sexual Activity   Alcohol use: No    Alcohol/week: 0.0 standard drinks of alcohol   Drug use: Never   Sexual activity: Yes    Birth control/protection: Surgical  Other Topics Concern   Not on file  Social History Narrative   POA-Dennis   Caffeine use: daily (1 soda per day, 1 coffee per day)   Left handed    Legally blind      Worked as LPN/RN for about 8 years   Social Drivers of Corporate Investment Banker Strain: Not on file  Food Insecurity: Not on file  Transportation Needs: Not on file  Physical Activity: Not on file  Stress: Not on  file  Social Connections: Not on file    Allergies:  Allergies  Allergen Reactions   Bacitracin Rash   Neomycin Rash   Sulfa Antibiotics Swelling and Rash   Sulfamethoxazole Swelling and Other (See Comments)   Egg-Derived Products Diarrhea and Nausea And Vomiting    Stomach cramps   Elavil  [Amitriptyline  Hcl] Other (See Comments)    Really bad tremors   Gabapentin  Other (See Comments)    Balance issues   Mixed Ragweed  Nasal congestion   Prozac [Fluoxetine Hcl]     Severe headache    Metabolic Disorder Labs: No results found for: HGBA1C, MPG No results found for: PROLACTIN Lab Results  Component Value Date   CHOL 203 (H) 11/18/2017   TRIG 227 (H) 11/18/2017   HDL 51 11/18/2017   CHOLHDL 4.0 11/18/2017   LDLCALC 107 (H) 11/18/2017   Lab Results  Component Value Date   TSH 1.940 03/24/2021   TSH 2.940 03/02/2018    Therapeutic Level Labs: No results found for: LITHIUM No results found for: VALPROATE Lab Results  Component Value Date   CBMZ 4.0 01/16/2021   CBMZ 6.1 11/14/2019    Current Medications: Current Outpatient Medications  Medication Sig Dispense Refill   ALPRAZolam  (XANAX ) 1 MG tablet Take 1 tablet (1 mg total) by mouth 2 (two) times daily as needed. for anxiety 60 tablet 2   CARBATROL  200 MG 12 hr capsule TAKE ONE CAPSULE BY MOUTH ONCE DAILY (Patient not taking: Reported on 10/15/2022) 90 capsule 3   Cholecalciferol (VITAMIN D3 PO) Take 1 tablet by mouth daily.     DEXILANT  60 MG capsule TAKE ONE CAPSULE BY MOUTH DAILY; ON AN EMPTY STOMACH, THEN DO NOT EAT FOR ONE HOUR. (Patient taking differently: Take 60 mg by mouth daily before breakfast.) 30 capsule 5   DULoxetine  (CYMBALTA ) 60 MG capsule Take 1 capsule (60 mg total) by mouth 2 (two) times daily. 180 capsule 3   HYDROcodone-acetaminophen  (NORCO/VICODIN) 5-325 MG tablet Take 1 tablet by mouth every 6 (six) hours as needed for moderate pain.     hydroxychloroquine  (PLAQUENIL ) 200 MG  tablet Take 1 tablet (200 mg total) by mouth 2 (two) times daily. (Patient taking differently: Take 100 mg by mouth 2 (two) times daily. Take four 2 tablets by mouth with food or milk once daily.) 180 tablet 1   MAXALT  10 MG tablet Take 1 tablet (10 mg total) by mouth as needed. (Patient taking differently: Take 10 mg by mouth 3 (three) times daily as needed for migraine.) 10 tablet 5   metoprolol  succinate (TOPROL -XL) 100 MG 24 hr tablet Take 1 tablet (100 mg total) by mouth every evening. (Patient taking differently: Take 100 mg by mouth every evening.) 90 tablet 0   NARCAN 4 MG/0.1ML LIQD nasal spray kit Place 1 spray into the nose as needed (accidental overdose.). (Patient not taking: Reported on 10/15/2022)     NIACIN PO Take by mouth daily.     ondansetron  (ZOFRAN ) 4 MG tablet Take 4 mg by mouth every 8 (eight) hours as needed for nausea or vomiting.     primidone  (MYSOLINE ) 50 MG tablet Take 1 tablet (50 mg total) by mouth at bedtime. 30 tablet 2   venlafaxine  XR (EFFEXOR  XR) 75 MG 24 hr capsule Take 1 capsule (75 mg total) by mouth daily. 30 capsule 2   VITAMIN D  PO Take 1 tablet by mouth daily.     No current facility-administered medications for this visit.     Musculoskeletal: Strength & Muscle Tone: na Gait & Station: na Patient leans: N/A  Psychiatric Specialty Exam: Review of Systems  Musculoskeletal:  Positive for arthralgias and myalgias.  All other systems reviewed and are negative.   There were no vitals taken for this visit.There is no height or weight on file to calculate BMI.  General Appearance: NA  Eye Contact:  NA  Speech:  Clear and Coherent  Volume:  Normal  Mood:  Euthymic  Affect:  NA  Thought Process:  Goal Directed  Orientation:  Full (Time, Place, and Person)  Thought Content: WDL   Suicidal Thoughts:  No  Homicidal Thoughts:  No  Memory:  Immediate;   Good Recent;   Good Remote;   Good  Judgement:  Good  Insight:  Good  Psychomotor Activity:   Decreased  Concentration:  Concentration: Good and Attention Span: Good  Recall:  Good  Fund of Knowledge: Good  Language: Good  Akathisia:  No  Handed:  Right  AIMS (if indicated): not done  Assets:  Communication Skills Desire for Improvement Resilience Social Support Talents/Skills  ADL's:  Intact  Cognition: WNL  Sleep:  Fair   Screenings: GAD-7    Advertising Copywriter from 03/30/2019 in [facility] [place]  Total GAD-7 Score 2      PHQ2-9    Flowsheet Row Video Visit from 01/12/2023 in [facility] [place] Video Visit from 03/31/2022 in [facility] [place] Video Visit from 09/30/2021 in [facility] [place] Counselor from 07/16/2021 in [facility] [place] Video Visit from 07/01/2021 in [facility] Total Score 3 0 0 1 0  PHQ-9 Total Score 11 -- -- -- --      Flowsheet Row Video Visit from 01/12/2023 in [facility] [place] Video Visit from 03/31/2022 in [facility] [place] Video Visit from 09/30/2021 in [facility] at [place]  C-SSRS RISK CATEGORY No Risk No Risk No Risk        Assessment and Plan: This patient is a 78 year old female with a history of depression anxiety and PTSD.  She had become more depressed after she lost her cat.  However she is doing better with the additional Effexor  XR 75 mg daily for depression as well as Cymbalta  60 mg twice daily for depression and fibromyalgia.  She will continue these as well as Xanax  1 mg up to twice daily as needed for anxiety and Mysoline  50 mg in the evening for tremor as needed.  She will return to see me in 3 months  Collaboration of Care: Collaboration of Care: Referral or follow-up with counselor/therapist  AEB patient has been referred back to Winton Rubinstein in our office for therapy  Patient/Guardian was advised Release of Information must be obtained prior to any record release in order to collaborate their care with an outside provider. Patient/Guardian was advised if they have not already done so to contact the registration department to sign all necessary forms in order for us  to release information regarding their care.   Consent: Patient/Guardian gives verbal consent for treatment and assignment of benefits for services provided during this visit. Patient/Guardian expressed understanding and agreed to proceed.    Barnie Gull, MD 02/09/2023, 9:21 AM

## 2023-03-16 ENCOUNTER — Ambulatory Visit (INDEPENDENT_AMBULATORY_CARE_PROVIDER_SITE_OTHER): Payer: Medicare Other | Admitting: Psychiatry

## 2023-03-16 DIAGNOSIS — F321 Major depressive disorder, single episode, moderate: Secondary | ICD-10-CM | POA: Diagnosis not present
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
## 2023-04-05 ENCOUNTER — Ambulatory Visit (INDEPENDENT_AMBULATORY_CARE_PROVIDER_SITE_OTHER): Payer: Medicare Other | Admitting: Psychiatry

## 2023-04-05 DIAGNOSIS — F321 Major depressive disorder, single episode, moderate: Secondary | ICD-10-CM

## 2023-04-05 NOTE — Progress Notes (Signed)
 Virtual Visit via Telephone Note  I connected with Erica Bennett on 04/05/23 at 10:00 AM EDT by telephone and verified that I am speaking with the correct person using two identifiers.  Location: Patient: Home Provider: [facility] [place] office    I discussed the limitations, risks, security and privacy concerns of performing an evaluation and management service by telephone and the availability of in person appointments. I also discussed with the patient that there may be a patient responsible charge related to this service. The patient expressed understanding and agreed to proceed.      I provided 50 minutes of non-face-to-face time during this encounter.   Erica Salvage, LCSW                THERAPIST PROGRESS NOTE          Session Time:  Monday 04/05/2023 10:10 AM -  11:00 AM   Participation Level: Active  Behavioral Response: Alert, anxious  Type of Therapy: Individual Therapy  Treatment Goals addressed: Establish therapeutic alliance, learn and implement relaxation techniques  Progress on Goals: Formal treatment plan will be developed next session      Interventions: CBT and Supportive  Summary: Erica Bennett is a 78 y.o. female who presents with a history of depression and has been seen intermittlently by this clinican for several years.She denies any psychiatric hospitalizations. She continues to see psychiatrist Dr. Tenny Craw  for medication management.  Patient is resuming services due to experiencing increased stress and anxiety.  Per patient's report, her cat died in 12-16-2024and she began to experience sadness and crying spells.  Since taking Effexor as prescribed by psychiatrist, patient reports crying spells have stopped.  However she continues to report increased worry and nervousness.  Stressors include a rent increase by $300 per month.  She also reports continued adjustment issues related to grief and loss now that both her husband and the  cat are gone and she now is alone in her home.  She states hating the silence.    Patient last was seen via virtual visit about 2 to 3 weeks ago for the assessment appointment.  Patient reports little to no change in symptoms.  She is continuing to adjust to being in her home without her pet as well as her husband.  She states being more more anxious and and reports shaking.  She expresses frustration with self as she did not expect to experience these symptoms 3 years after her husband's death.  She also reports continued stress and anxiety regarding her rent.  She is on the waiting list for subsidized housing.  Patient patient reports stress and anxiety about the future.    Suicidal/Homicidal: Nowithout intent/plan       Therapist Response:, reviewed symptoms, gathered more information from patient, discussed stressors, facilitated expression of thoughts and feelings, validated feelings, normalized feelings related to grief and loss,began to discuss living with uncertainty, reviewed relaxation techniques, developed plan with patient to practice relaxation techniques 3 times per day  plan return in 2 weeks                   Diagnosis: Axis I: Major Depression, single episode    Axis II: No diagnosis  Collaboration of Care: Psychiatrist AEB  patient works with psychiatrist Dr. Tenny Craw.  , Clinician reviewing chart  Patient/Guardian was advised Release of Information must be obtained prior to any record release in order to collaborate their care with an outside provider. Patient/Guardian  was advised if they have not already done so to contact the registration department to sign all necessary forms in order for [place] to release information regarding their care.   Consent: Patient/Guardian gives verbal consent for treatment and assignment of benefits for services provided during this visit. Patient/Guardian expressed understanding and agreed to proceed.   Erica Salvage, LCSW 04/05/2023

## 2023-05-04 ENCOUNTER — Telehealth (INDEPENDENT_AMBULATORY_CARE_PROVIDER_SITE_OTHER): Payer: Medicare Other | Admitting: Psychiatry

## 2023-05-04 ENCOUNTER — Encounter (HOSPITAL_COMMUNITY): Payer: Self-pay | Admitting: Psychiatry

## 2023-05-04 DIAGNOSIS — F4310 Post-traumatic stress disorder, unspecified: Secondary | ICD-10-CM | POA: Diagnosis not present

## 2023-05-04 DIAGNOSIS — F321 Major depressive disorder, single episode, moderate: Secondary | ICD-10-CM

## 2023-05-04 MED ORDER — PRIMIDONE 50 MG PO TABS: 50.0000 mg | ORAL_TABLET | Freq: Every day | ORAL | 2 refills | Status: DC

## 2023-05-04 MED ORDER — DULOXETINE HCL 60 MG PO CPEP: 60.0000 mg | ORAL_CAPSULE | Freq: Two times a day (BID) | ORAL | 3 refills | Status: DC

## 2023-05-04 MED ORDER — ALPRAZOLAM 1 MG PO TABS: 1.0000 mg | ORAL_TABLET | Freq: Two times a day (BID) | ORAL | 2 refills | Status: DC | PRN

## 2023-05-04 MED ORDER — METHYLPHENIDATE HCL 10 MG PO TABS: 10.0000 mg | ORAL_TABLET | Freq: Two times a day (BID) | ORAL | 0 refills | Status: DC

## 2023-05-04 NOTE — Progress Notes (Signed)
 Virtual Visit via Telephone Note  I connected with Erica Bennett on 05/04/23 at  9:20 AM EDT by telephone and verified that I am speaking with the correct person using two identifiers.  Location: Patient: home Provider: office   I discussed the limitations, risks, security and privacy concerns of performing an evaluation and management service by telephone and the availability of in person appointments. I also discussed with the patient that there may be a patient responsible charge related to this service. The patient expressed understanding and agreed to proceed.      I discussed the assessment and treatment plan with the patient. The patient was provided an opportunity to ask questions and all were answered. The patient agreed with the plan and demonstrated an understanding of the instructions.   The patient was advised to call back or seek an in-person evaluation if the symptoms worsen or if the condition fails to improve as anticipated.  I provided 20 minutes of non-face-to-face time during this encounter.   Diannia Ruder, MD  [facility] MD/PA/NP OP Progress Note  05/04/2023 9:49 AM Erica Bennett  MRN:  [id number]  Chief Complaint:  Chief Complaint  Patient presents with   Depression   Anxiety   Follow-up   HPI: This patient is a 78 year old widowed white female who lives alone in [place].  She has no children.  She is on disability for blindness.  The patient returns for follow-up after 2 months regarding her depression anxiety and PTSD.  She states that she had to stop the Effexor that we added to help her mood because it was causing nightmares.  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  She states the fatigue from these has grown much worse.  She is using 5-hour energy drinks to try to keep going through a day.  I told her that these were pretty much just caffeine and that would do her better to take a prescribed stimulant.  She is willing to try this.  She  denies significant depression but her main concern today is the fatigue. Visit Diagnosis:    ICD-10-CM   1. Moderate single current episode of major depressive disorder (HCC)  F32.1     2. PTSD (post-traumatic stress disorder)  F43.10       Past Psychiatric History: Prior outpatient treatment  Past Medical History:  Past Medical History:  Diagnosis Date   Anxiety    Blindness of both eyes    due to degenerative retina and acute glaucoma   Complication of anesthesia    pt has limited mouth opening due to bilateral tmj surgery   Fecal incontinence    Fibromyalgia    Generalized seizure disorder [facility])    neurologist-- dr Anne Hahn--  seizure is staring off and dropping things  (12-20-2019  per pt last seizure approx. 2005)   GERD (gastroesophageal reflux disease)    H/O Hashimoto thyroiditis    History of acute angle-closure glaucoma    bilateral eyes  s/p eye removal, now has prosthesis   History of hypertension    History of skin cancer    History of thyroid nodule    bx left thyroid nodule 11-17-2017;  s/p left thyroidectomy 11-07-2018, benign mass   Hyperlipidemia    Limitation of opening of mouth    per pt due to bilateral tmj surgery 1989   MDD (major depressive disorder)    Migraines    Mixed connective tissue disease (HCC)    rheumotologist-- dr Dierdre Forth--  taking plaquenil   OA (osteoarthritis)    OSA (obstructive sleep apnea)    does not use CPAP   Osteoporosis    PONV (postoperative nausea and vomiting)    and hard to wake   Prosthetic eye globe    bilateral   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 ;  s/p enucleation     Past Surgical History:  Procedure Laterality Date   ABDOMINAL HYSTERECTOMY  1980s   AND APPENDECTOMY   ENUCLEATION Right 1984   right eye   EVISCERATION Left 1996   left eye   LAPAROSCOPIC CHOLECYSTECTOMY  1993   SHOULDER ARTHROSCOPY WITH SUBACROMIAL DECOMPRESSION Right 09-15-2007   @[facility]    AND BURSECTOMY/  DEBRIDEMENT ROTATOR CUFF   TEMPOROMANDIBULAR JOINT SURGERY Bilateral 1989   THYROIDECTOMY Left 11/07/2018   Procedure: LEFT HEMI THYROIDECTOMY;  Surgeon: Newman Pies, MD;  Location: [facility];  Service: ENT;  Laterality: Left;   TONSILLECTOMY  child    Family Psychiatric History: See below  Family History:  Family History  Problem Relation Age of Onset   Colon cancer Mother    Parkinson's disease Mother    Depression Maternal Aunt    Colon cancer Maternal Aunt    Depression Paternal Aunt    Alcohol abuse Paternal Uncle    Drug abuse Paternal Uncle    Parkinson's disease Maternal Grandfather    Depression Cousin    Colon cancer Other     Social History:  Social History   Socioeconomic History   Marital status: Married    Spouse name: Not on file   Number of children: 0   Years of education: 18   Highest education level: Not on file  Occupational History   Occupation: Retired  Tobacco Use   Smoking status: Former    Current packs/day: 0.00    Types: Cigarettes    Start date: 01/03/1972    Quit date: 01/03/1992    Years since quitting: 31.3   Smokeless tobacco: Never  Vaping Use   Vaping status: Never Used  Substance and Sexual Activity   Alcohol use: No    Alcohol/week: 0.0 standard drinks of alcohol   Drug use: Never   Sexual activity: Yes    Birth control/protection: Surgical  Other Topics Concern   Not on file  Social History Narrative   POA-[place]   Caffeine use: daily (1 soda per day, 1 coffee per day)   Left handed    Legally blind      Worked as LPN/RN for about 8 years   Social Drivers of Corporate investment banker Strain: Not on file  Food Insecurity: Not on file  Transportation Needs: Not on file  Physical Activity: Not on file  Stress: Not on file  Social Connections: Not on file    Allergies:  Allergies  Allergen Reactions   Bacitracin Rash   Neomycin Rash   Sulfa Antibiotics Swelling and Rash    Sulfamethoxazole Swelling and Other (See Comments)   Egg-Derived Products Diarrhea and Nausea And Vomiting    Stomach cramps   Elavil [Amitriptyline Hcl] Other (See Comments)    Really bad tremors   Gabapentin Other (See Comments)    Balance issues   Mixed Ragweed     Nasal congestion   Prozac [Fluoxetine Hcl]     Severe headache    Metabolic Disorder Labs: No results found for: "HGBA1C", "MPG" No results found for: "PROLACTIN"  Lab Results  Component Value Date   CHOL 203 (H) 11/18/2017   TRIG 227 (H) 11/18/2017   HDL 51 11/18/2017   CHOLHDL 4.0 11/18/2017   LDLCALC 107 (H) 11/18/2017   Lab Results  Component Value Date   TSH 1.940 03/24/2021   TSH 2.940 03/02/2018    Therapeutic Level Labs: No results found for: "LITHIUM" No results found for: "VALPROATE" Lab Results  Component Value Date   CBMZ 4.0 01/16/2021   CBMZ 6.1 11/14/2019    Current Medications: Current Outpatient Medications  Medication Sig Dispense Refill   methylphenidate (RITALIN) 10 MG tablet Take 1 tablet (10 mg total) by mouth 2 (two) times daily with breakfast and lunch. 60 tablet 0   ALPRAZolam (XANAX) 1 MG tablet Take 1 tablet (1 mg total) by mouth 2 (two) times daily as needed. for anxiety 60 tablet 2   CARBATROL 200 MG 12 hr capsule TAKE ONE CAPSULE BY MOUTH ONCE DAILY (Patient not taking: Reported on 10/15/2022) 90 capsule 3   Cholecalciferol (VITAMIN D3 PO) Take 1 tablet by mouth daily.     DEXILANT 60 MG capsule TAKE ONE CAPSULE BY MOUTH DAILY; ON AN EMPTY STOMACH, THEN DO NOT EAT FOR ONE HOUR. (Patient taking differently: Take 60 mg by mouth daily before breakfast.) 30 capsule 5   DULoxetine (CYMBALTA) 60 MG capsule Take 1 capsule (60 mg total) by mouth 2 (two) times daily. 180 capsule 3   HYDROcodone-acetaminophen (NORCO/VICODIN) 5-325 MG tablet Take 1 tablet by mouth every 6 (six) hours as needed for moderate pain.     hydroxychloroquine (PLAQUENIL) 200 MG tablet Take 1 tablet (200 mg  total) by mouth 2 (two) times daily. (Patient taking differently: Take 100 mg by mouth 2 (two) times daily. Take four 2 tablets by mouth with food or milk once daily.) 180 tablet 1   MAXALT 10 MG tablet Take 1 tablet (10 mg total) by mouth as needed. (Patient taking differently: Take 10 mg by mouth 3 (three) times daily as needed for migraine.) 10 tablet 5   metoprolol succinate (TOPROL-XL) 100 MG 24 hr tablet Take 1 tablet (100 mg total) by mouth every evening. (Patient taking differently: Take 100 mg by mouth every evening.) 90 tablet 0   NARCAN 4 MG/0.1ML LIQD nasal spray kit Place 1 spray into the nose as needed (accidental overdose.). (Patient not taking: Reported on 10/15/2022)     NIACIN PO Take by mouth daily.     ondansetron (ZOFRAN) 4 MG tablet Take 4 mg by mouth every 8 (eight) hours as needed for nausea or vomiting.     primidone (MYSOLINE) 50 MG tablet Take 1 tablet (50 mg total) by mouth at bedtime. 30 tablet 2   VITAMIN D PO Take 1 tablet by mouth daily.     No current facility-administered medications for this visit.     Musculoskeletal: Strength & Muscle Tone: na Gait & Station: na Patient leans: N/A  Psychiatric Specialty Exam: Review of Systems  Constitutional:  Positive for fatigue.  Eyes:  Positive for visual disturbance.  Musculoskeletal:  Positive for arthralgias and myalgias.  Neurological:  Positive for tremors and weakness.  Psychiatric/Behavioral:  Positive for decreased concentration.   All other systems reviewed and are negative.   There were no vitals taken for this visit.There is no height or weight on file to calculate BMI.  General Appearance: NA  Eye Contact:  NA  Speech:  Clear and Coherent  Volume:  Normal  Mood:  Euthymic  Affect:  NA  Thought Process:  Goal Directed  Orientation:  Full (Time, Place, and Person)  Thought Content: WDL   Suicidal Thoughts:  No  Homicidal Thoughts:  No  Memory:  Immediate;   Good Recent;   Good Remote;   Good   Judgement:  Good  Insight:  Good  Psychomotor Activity:  Decreased  Concentration:  Concentration: Poor and Attention Span: Poor  Recall:  Good  Fund of Knowledge: Good  Language: Good  Akathisia:  No  Handed:  Right  AIMS (if indicated): not done  Assets:  Communication Skills Desire for Improvement Resilience Social Support  ADL's:  Intact  Cognition: WNL  Sleep:  Fair   Screenings: GAD-7    Advertising copywriter from 03/16/2023 in [facility] [place] Counselor from 03/30/2019 in [facility] [place]  Total GAD-7 Score 12 2      PHQ2-9    Flowsheet Row Counselor from 03/16/2023 in [facility] [place] Video Visit from 01/12/2023 in [facility] at [place] Video Visit from 03/31/2022 in [facility] at [place] Video Visit from 09/30/2021 in [facility] at [place] Counselor from 07/16/2021 in [facility] Total Score 2 3 0 0 1  PHQ-9 Total Score 8 11 -- -- --      Flowsheet Row Counselor from 03/16/2023 in [facility] at [place] Video Visit from 01/12/2023 in [facility] at [place] Video Visit from 03/31/2022 in [facility] at [place]  C-SSRS RISK CATEGORY No Risk No Risk No Risk        Assessment and Plan: This patient is a 78 year old female with a history of depression anxiety and PTSD.  She did not tolerate the venlafaxine and this is discontinued.  Since she is having significant problems with fatigue low energy and poor focus we will start methylphenidate 10 mg twice daily.  She will continue Cymbalta 60 mg twice daily for depression and fibromyalgia, Mysoline 50 mg the evening for tremor as needed and Xanax 1 mg up to twice daily as needed  for anxiety.  She will repeat return to see me in 4 weeks  Collaboration of Care: Collaboration of Care: Referral or follow-up with counselor/therapist AEB patient will continue therapy with Florencia Reasons in our office  Patient/Guardian was advised Release of Information must be obtained prior to any record release in order to collaborate their care with an outside provider. Patient/Guardian was advised if they have not already done so to contact the registration department to sign all necessary forms in order for [place] to release information regarding their care.   Consent: Patient/Guardian gives verbal consent for treatment and assignment of benefits for services provided during this visit. Patient/Guardian expressed understanding and agreed to proceed.    Diannia Ruder, MD 05/04/2023, 9:49 AM

## 2023-05-07 ENCOUNTER — Ambulatory Visit (INDEPENDENT_AMBULATORY_CARE_PROVIDER_SITE_OTHER): Payer: Medicare Other | Admitting: Psychiatry

## 2023-05-07 DIAGNOSIS — F321 Major depressive disorder, single episode, moderate: Secondary | ICD-10-CM | POA: Diagnosis not present

## 2023-05-07 NOTE — Progress Notes (Signed)
 Virtual Visit via Telephone Note  I connected with Erica Bennett on 05/07/23 at 10:15 AM EDT by telephone and verified that I am speaking with the correct person using two identifiers.  Location: Patient: Home Provider: Home office    I discussed the limitations, risks, security and privacy concerns of performing an evaluation and management service by telephone and the availability of in person appointments. I also discussed with the patient that there may be a patient responsible charge related to this service. The patient expressed understanding and agreed to proceed.      I provided  43 minutes of non-face-to-face time during this encounter.   [name], LCSW                THERAPIST PROGRESS NOTE          Session Time:  Friday 05/07/2023 10:15 AM -  10:58 AM  Participation Level: Active  Behavioral Response: Alert, less anxious  Type of Therapy: Individual Therapy  Treatment Goals addressed: Establish therapeutic alliance, learn and implement relaxation techniques  Progress on Goals: Formal treatment plan will be developed next session      Interventions: CBT and Supportive  Summary: Erica Bennett is a 78 y.o. female who presents with a history of depression and has been seen intermittlently by this clinican for several years.She denies any psychiatric hospitalizations. She continues to see psychiatrist Dr. Tenny Craw  for medication management.  Patient is resuming services due to experiencing increased stress and anxiety.  Per patient's report, her cat died in 12/13/24and she began to experience sadness and crying spells.  Since taking Effexor as prescribed by psychiatrist, patient reports crying spells have stopped.  However she continues to report increased worry and nervousness.  Stressors include a rent increase by $300 per month.  She also reports continued adjustment issues related to grief and loss now that both her husband and the cat are gone and  she now is alone in her home.  She states hating the silence.    Patient last was seen via virtual visit about 4 weeks ago.  Per patient's report, she is feeling much better since taking Ritalin as prescribed by psychiatrist Dr. Tenny Craw.  She reports increased energy, increased behavioral activation, decreased shaking and increased endurance.  Patient states feeling stronger physically and says her mindset has improved.  She also has observed self humming which she says she has not done in 3 years.  Patient also is pleased with absence of nightmares since discontinuing taking Effexor.  She continues to have worries about financial issues and housing concerns exacerbated by concerns regarding the political climate and its effects.  However, patient reports not dwelling on these worries as much now that she has more energy and is more involved in activities.  Patient also is attending church and maintains telephone contact with her friends.    Suicidal/Homicidal: Nowithout intent/plan       Therapist Response:, reviewed symptoms, praised and reinforced patient's i medication compliance, discussed effects, praised and reinforced patient's increased involvement in activity, discussed effects, discussed stressors, facilitated expression of thoughts and feelings, validated feelings, began to discuss next steps for treatment to include relapse prevention strategies and coping with the upcoming anniversary of her husband's death plan return in 2 weeks                   Diagnosis: Axis I: Major Depression, single episode    Axis II: No diagnosis  Collaboration of  Care: Psychiatrist AEB  patient works with psychiatrist Dr. Tenny Craw.  , Clinician reviewing chart  Patient/Guardian was advised Release of Information must be obtained prior to any record release in order to collaborate their care with an outside provider. Patient/Guardian was advised if they have not already done so to contact the registration department to  sign all necessary forms in order for [place] to release information regarding their care.   Consent: Patient/Guardian gives verbal consent for treatment and assignment of benefits for services provided during this visit. Patient/Guardian expressed understanding and agreed to proceed.   [name], LCSW 05/07/2023

## 2023-05-17 ENCOUNTER — Telehealth (HOSPITAL_COMMUNITY): Payer: Self-pay

## 2023-05-17 NOTE — Telephone Encounter (Signed)
 Pt called in requesting a refill on her methylphenidate  (RITALIN ) 10 MG tablet sent to [facility]. Pt scheduled for 06/02/23. Please advise.

## 2023-05-17 NOTE — Telephone Encounter (Signed)
 Pt called pharmacy they had only given her half of rx and are working on filling the rest now

## 2023-05-17 NOTE — Telephone Encounter (Signed)
 It was sent on 4/8, to soon to refill

## 2023-05-21 ENCOUNTER — Ambulatory Visit (INDEPENDENT_AMBULATORY_CARE_PROVIDER_SITE_OTHER): Payer: Medicare Other | Admitting: Psychiatry

## 2023-05-21 DIAGNOSIS — F321 Major depressive disorder, single episode, moderate: Secondary | ICD-10-CM | POA: Diagnosis not present

## 2023-05-21 NOTE — Progress Notes (Signed)
 Virtual Visit via Telephone Note  I connected with Erica Bennett on 05/21/23 at 10:15 AM EDT by telephone and verified that I am speaking with the correct person using two identifiers.  Location: Patient: Home Provider: Home office    I discussed the limitations, risks, security and privacy concerns of performing an evaluation and management service by telephone and the availability of in person appointments. I also discussed with the patient that there may be a patient responsible charge related to this service. The patient expressed understanding and agreed to proceed.      I provided  43 minutes of non-face-to-face time during this encounter.   Dicie Foster, LCSW                THERAPIST PROGRESS NOTE          Session Time:  Friday 05/21/2023 10:00 AM- 10:50 AM  Participation Level: Active  Behavioral Response: Alert, euthymic  Type of Therapy: Individual Therapy  Treatment Goals addressed: identify and  verbalize feelings associated with loss, learn and implement relapse prevention techniques  Progress on Goals: Formal treatment plan will be developed next session      Interventions: CBT and Supportive  Summary: Erica Bennett is a 78 y.o. female who presents with a history of depression and has been seen intermittlently by this clinican for several years.She denies any psychiatric hospitalizations. She continues to see psychiatrist Dr. Avanell Bob  for medication management.  Patient is resuming services due to experiencing increased stress and anxiety.  Per patient's report, her cat died in 24-Nov-2024and she began to experience sadness and crying spells.  Since taking Effexor  as prescribed by psychiatrist, patient reports crying spells have stopped.  However she continues to report increased worry and nervousness.  Stressors include a rent increase by $300 per month.  She also reports continued adjustment issues related to grief and loss now that both her husband  and the cat are gone and she now is alone in her home.  She states hating the silence.    Patient last was seen via virtual visit about 2 weeks ago.  Patient reports continuing to feel much better since continued use of Ritalin  as prescribed by psychiatrist Dr. Avanell Bob.  She continues to experience increased energy, increased behavioral activation, decreased shaking, and increased endurance.  This has had a very positive effect on her mood.  Patient is very pleased she was able to participate in all the Easter activities for her faith.  Patient also reports enjoying reading again as she now is able to retain information and is more focused.  She now is more hopeful about the future.  Patient reports continued socialization by attending church as well as interacting with her neighbors and friends.  She reports recently going out to dinner with a friend.  Patient reports increase thoughts and memories about deceased husband as the anniversary of his death as well as their wedding anniversary approaches.  However, she reports not being overwhelmed by this and states she now feels a sense of freedom and is ready to move forward with her life.   Suicidal/Homicidal: Nowithout intent/plan       Therapist Response:, reviewed symptoms, praised and reinforced patient's medication compliance, discussed effects, praised and reinforced patient's increased behavioral activation and socialization, discussed effects, encouraged patient to maintain consistent involvement, facilitated patient expressing thoughts and feelings regarding her husband's behavior and the effects on their marriage as well as the effects on patient, validated feelings, discussed  ways to cope with upcoming anniversaries    plan return in 2 weeks                   Diagnosis: Axis I: Major Depression, single episode    Axis II: No diagnosis  Collaboration of Care: Psychiatrist AEB  patient works with psychiatrist Dr. Avanell Bob.  , Clinician reviewing  chart  Patient/Guardian was advised Release of Information must be obtained prior to any record release in order to collaborate their care with an outside provider. Patient/Guardian was advised if they have not already done so to contact the registration department to sign all necessary forms in order for us  to release information regarding their care.   Consent: Patient/Guardian gives verbal consent for treatment and assignment of benefits for services provided during this visit. Patient/Guardian expressed understanding and agreed to proceed.   Dicie Foster, LCSW 05/21/2023

## 2023-05-27 ENCOUNTER — Telehealth (HOSPITAL_COMMUNITY): Payer: Self-pay

## 2023-05-27 NOTE — Telephone Encounter (Signed)
 Pt states the Ritalin  is working and was able to stay for the [employer] [place] and able to stay for supper at [employer]. She also wanted to let you know that she was able to get through the 3 year anniversary of her husbands passing.

## 2023-05-27 NOTE — Telephone Encounter (Signed)
 Noted, thanks!

## 2023-06-01 ENCOUNTER — Telehealth (HOSPITAL_COMMUNITY): Payer: Self-pay | Admitting: *Deleted

## 2023-06-01 ENCOUNTER — Other Ambulatory Visit (HOSPITAL_COMMUNITY): Payer: Self-pay | Admitting: Psychiatry

## 2023-06-01 MED ORDER — METHYLPHENIDATE HCL 10 MG PO TABS: 10.0000 mg | ORAL_TABLET | Freq: Two times a day (BID) | ORAL | 0 refills | Status: DC

## 2023-06-01 NOTE — Telephone Encounter (Signed)
 Per pt her Ritalin  is needing refills. Per pt her pharmacy is [facility].

## 2023-06-01 NOTE — Telephone Encounter (Signed)
 sent

## 2023-06-02 ENCOUNTER — Telehealth (HOSPITAL_COMMUNITY): Admitting: Psychiatry

## 2023-06-02 ENCOUNTER — Encounter (HOSPITAL_COMMUNITY): Payer: Self-pay | Admitting: Psychiatry

## 2023-06-02 DIAGNOSIS — F321 Major depressive disorder, single episode, moderate: Secondary | ICD-10-CM | POA: Diagnosis not present

## 2023-06-02 DIAGNOSIS — F4310 Post-traumatic stress disorder, unspecified: Secondary | ICD-10-CM

## 2023-06-02 MED ORDER — METHYLPHENIDATE HCL 10 MG PO TABS: 10.0000 mg | ORAL_TABLET | Freq: Two times a day (BID) | ORAL | 0 refills | Status: DC

## 2023-06-02 MED ORDER — DULOXETINE HCL 60 MG PO CPEP: 60.0000 mg | ORAL_CAPSULE | Freq: Two times a day (BID) | ORAL | 3 refills | Status: DC

## 2023-06-02 MED ORDER — ALPRAZOLAM 1 MG PO TABS: 1.0000 mg | ORAL_TABLET | Freq: Two times a day (BID) | ORAL | 2 refills | Status: DC | PRN

## 2023-06-02 NOTE — Telephone Encounter (Signed)
 lmom

## 2023-06-02 NOTE — Progress Notes (Signed)
 Virtual Visit via Telephone Note  I connected with Erica Bennett on 06/02/23 at 11:20 AM EDT by telephone and verified that I am speaking with the correct person using two identifiers.  Location: Patient: home Provider: office   I discussed the limitations, risks, security and privacy concerns of performing an evaluation and management service by telephone and the availability of in person appointments. I also discussed with the patient that there may be a patient responsible charge related to this service. The patient expressed understanding and agreed to proceed.      I discussed the assessment and treatment plan with the patient. The patient was provided an opportunity to ask questions and all were answered. The patient agreed with the plan and demonstrated an understanding of the instructions.   The patient was advised to call back or seek an in-person evaluation if the symptoms worsen or if the condition fails to improve as anticipated.  I provided 20 minutes of non-face-to-face time during this encounter.   Alfredia Annas, MD  [facility] MD/PA/NP OP Progress Note  06/02/2023 11:37 AM [name]  MRN:  [id number]  Chief Complaint:  Chief Complaint  Patient presents with   Anxiety   Depression   ADD   Follow-up   HPI: This patient is a 78 year old widowed white female who lives alone in [place].  She has no children.  She is on disability for blindness.  The patient returns for follow-up after 4 weeks regarding her depression anxiety and PTSD.  She states that last visit she was having severe fatigue probably secondary to her mixed active tissue disease.  She was using 5-hour energy drinks to keep going.  I suggested we switch to methylphenidate  10 mg twice daily.  She states that this has helped tremendously.  She is able to stay awake and alert through the day and get more done.  She is more tired at night and has been able to get to sleep much more easily.  She denies  significant depression and states that her shaking arms and legs have gotten much better. Visit Diagnosis:    ICD-10-CM   1. Moderate single current episode of major depressive disorder (HCC)  F32.1     2. PTSD (post-traumatic stress disorder)  F43.10       Past Psychiatric History: Prior outpatient treatment  Past Medical History:  Past Medical History:  Diagnosis Date   Anxiety    Blindness of both eyes    due to degenerative retina and acute glaucoma   Complication of anesthesia    pt has limited mouth opening due to bilateral tmj surgery   Fecal incontinence    Fibromyalgia    Generalized seizure disorder [facility])    neurologist-- dr Tilda Fogo--  seizure is staring off and dropping things  (12-20-2019  per pt last seizure approx. 2005)   GERD (gastroesophageal reflux disease)    H/O Hashimoto thyroiditis    History of acute angle-closure glaucoma    bilateral eyes  s/p eye removal, now has prosthesis   History of hypertension    History of skin cancer    History of thyroid  nodule    bx left thyroid  nodule 11-17-2017;  s/p left thyroidectomy 11-07-2018, benign mass   Hyperlipidemia    Limitation of opening of mouth    per pt due to bilateral tmj surgery 1989   MDD (major depressive disorder)    Migraines    Mixed connective tissue disease (HCC)    rheumotologist-- dr Ebbie Goldmann--  taking plaquenil    OA (osteoarthritis)    OSA (obstructive sleep apnea)    does not use CPAP   Osteoporosis    PONV (postoperative nausea and vomiting)    and hard to wake   Prosthetic eye globe    bilateral   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 ;  s/p enucleation     Past Surgical History:  Procedure Laterality Date   ABDOMINAL HYSTERECTOMY  1980s   AND APPENDECTOMY   ENUCLEATION Right 1984   right eye   EVISCERATION Left 1996   left eye   LAPAROSCOPIC CHOLECYSTECTOMY  1993   SHOULDER ARTHROSCOPY WITH SUBACROMIAL DECOMPRESSION  Right 09-15-2007  @[facility]    AND BURSECTOMY/  DEBRIDEMENT ROTATOR CUFF   TEMPOROMANDIBULAR JOINT SURGERY Bilateral 1989   THYROIDECTOMY Left 11/07/2018   Procedure: LEFT HEMI THYROIDECTOMY;  Surgeon: Reynold Caves, MD;  Location: [facility];  Service: ENT;  Laterality: Left;   TONSILLECTOMY  child    Family Psychiatric History: See below  Family History:  Family History  Problem Relation Age of Onset   Colon cancer Mother    Parkinson's disease Mother    Depression Maternal Aunt    Colon cancer Maternal Aunt    Depression Paternal Aunt    Alcohol abuse Paternal Uncle    Drug abuse Paternal Uncle    Parkinson's disease Maternal Grandfather    Depression Cousin    Colon cancer Other     Social History:  Social History   Socioeconomic History   Marital status: Married    Spouse name: Not on file   Number of children: 0   Years of education: 18   Highest education level: Not on file  Occupational History   Occupation: Retired  Tobacco Use   Smoking status: Former    Current packs/day: 0.00    Types: Cigarettes    Start date: 01/03/1972    Quit date: 01/03/1992    Years since quitting: 31.4   Smokeless tobacco: Never  Vaping Use   Vaping status: Never Used  Substance and Sexual Activity   Alcohol use: No    Alcohol/week: 0.0 standard drinks of alcohol   Drug use: Never   Sexual activity: Yes    Birth control/protection: Surgical  Other Topics Concern   Not on file  Social History Narrative   POA-Dennis   Caffeine use: daily (1 soda per day, 1 coffee per day)   Left handed    Legally blind      Worked as LPN/RN for about 8 years   Social Drivers of Corporate investment banker Strain: Not on file  Food Insecurity: Not on file  Transportation Needs: Not on file  Physical Activity: Not on file  Stress: Not on file  Social Connections: Not on file    Allergies:  Allergies  Allergen Reactions   Bacitracin Rash   Neomycin Rash   Sulfa Antibiotics  Swelling and Rash   Sulfamethoxazole Swelling and Other (See Comments)   Egg-Derived Products Diarrhea and Nausea And Vomiting    Stomach cramps   Elavil  [Amitriptyline  Hcl] Other (See Comments)    Really bad tremors   Gabapentin  Other (See Comments)    Balance issues   Mixed Ragweed     Nasal congestion   Prozac [Fluoxetine Hcl]     Severe headache    Metabolic Disorder Labs: No results found for: "HGBA1C", "MPG" No results found for: "PROLACTIN"  Lab Results  Component Value Date   CHOL 203 (H) 11/18/2017   TRIG 227 (H) 11/18/2017   HDL 51 11/18/2017   CHOLHDL 4.0 11/18/2017   LDLCALC 107 (H) 11/18/2017   Lab Results  Component Value Date   TSH 1.940 03/24/2021   TSH 2.940 03/02/2018    Therapeutic Level Labs: No results found for: "LITHIUM" No results found for: "VALPROATE" Lab Results  Component Value Date   CBMZ 4.0 01/16/2021   CBMZ 6.1 11/14/2019    Current Medications: Current Outpatient Medications  Medication Sig Dispense Refill   methylphenidate  (RITALIN ) 10 MG tablet Take 1 tablet (10 mg total) by mouth 2 (two) times daily with breakfast and lunch. 60 tablet 0   ALPRAZolam  (XANAX ) 1 MG tablet Take 1 tablet (1 mg total) by mouth 2 (two) times daily as needed. for anxiety 60 tablet 2   CARBATROL  200 MG 12 hr capsule TAKE ONE CAPSULE BY MOUTH ONCE DAILY (Patient not taking: Reported on 10/15/2022) 90 capsule 3   Cholecalciferol (VITAMIN D3 PO) Take 1 tablet by mouth daily.     DEXILANT  60 MG capsule TAKE ONE CAPSULE BY MOUTH DAILY; ON AN EMPTY STOMACH, THEN DO NOT EAT FOR ONE HOUR. (Patient taking differently: Take 60 mg by mouth daily before breakfast.) 30 capsule 5   DULoxetine  (CYMBALTA ) 60 MG capsule Take 1 capsule (60 mg total) by mouth 2 (two) times daily. 180 capsule 3   HYDROcodone-acetaminophen  (NORCO/VICODIN) 5-325 MG tablet Take 1 tablet by mouth every 6 (six) hours as needed for moderate pain.     hydroxychloroquine  (PLAQUENIL ) 200 MG tablet Take 1  tablet (200 mg total) by mouth 2 (two) times daily. (Patient taking differently: Take 100 mg by mouth 2 (two) times daily. Take four 2 tablets by mouth with food or milk once daily.) 180 tablet 1   MAXALT  10 MG tablet Take 1 tablet (10 mg total) by mouth as needed. (Patient taking differently: Take 10 mg by mouth 3 (three) times daily as needed for migraine.) 10 tablet 5   methylphenidate  (RITALIN ) 10 MG tablet Take 1 tablet (10 mg total) by mouth 2 (two) times daily with breakfast and lunch. 60 tablet 0   metoprolol  succinate (TOPROL -XL) 100 MG 24 hr tablet Take 1 tablet (100 mg total) by mouth every evening. (Patient taking differently: Take 100 mg by mouth every evening.) 90 tablet 0   NARCAN 4 MG/0.1ML LIQD nasal spray kit Place 1 spray into the nose as needed (accidental overdose.). (Patient not taking: Reported on 10/15/2022)     NIACIN PO Take by mouth daily.     ondansetron  (ZOFRAN ) 4 MG tablet Take 4 mg by mouth every 8 (eight) hours as needed for nausea or vomiting.     primidone  (MYSOLINE ) 50 MG tablet Take 1 tablet (50 mg total) by mouth at bedtime. 30 tablet 2   VITAMIN D  PO Take 1 tablet by mouth daily.     No current facility-administered medications for this visit.     Musculoskeletal: Strength & Muscle Tone: na Gait & Station: na Patient leans: N/A  Psychiatric Specialty Exam: Review of Systems  Constitutional:  Positive for fatigue.  Musculoskeletal:  Positive for arthralgias and myalgias.  All other systems reviewed and are negative.   There were no vitals taken for this visit.There is no height or weight on file to calculate BMI.  General Appearance: NA  Eye Contact:  NA  Speech:  Clear and Coherent  Volume:  Normal  Mood:  Euthymic  Affect:  NA  Thought Process:  Goal Directed  Orientation:  Full (Time, Place, and Person)  Thought Content: WDL   Suicidal Thoughts:  No  Homicidal Thoughts:  No  Memory:  Immediate;   Good Recent;   Good Remote;   Good   Judgement:  Good  Insight:  Good  Psychomotor Activity:  Normal  Concentration:  Concentration: Good and Attention Span: Good  Recall:  Good  Fund of Knowledge: Good  Language: Good  Akathisia:  No  Handed:  Right  AIMS (if indicated): not done  Assets:  Communication Skills Desire for Improvement Resilience Social Support Talents/Skills  ADL's:  Intact  Cognition: WNL  Sleep:  Fair   Screenings: GAD-7    Advertising copywriter from 03/16/2023 in [facility] [place] Counselor from 03/30/2019 in [facility] [place]  Total GAD-7 Score 12 2      PHQ2-9    Flowsheet Row Counselor from 03/16/2023 in [facility] [place] Video Visit from 01/12/2023 in [facility] [place] Video Visit from 03/31/2022 in [facility] [place] Video Visit from 09/30/2021 in [facility] [place] Counselor from 07/16/2021 in [facility] at [facility] Total Score 2 3 0 0 1  PHQ-9 Total Score 8 11 -- -- --      Flowsheet Row Counselor from 03/16/2023 in [facility] [place] Video Visit from 01/12/2023 in [facility] [place] Video Visit from 03/31/2022 in [facility] [place]  C-SSRS RISK CATEGORY No Risk No Risk No Risk        Assessment and Plan: This patient is a 78 year old female with a history of depression anxiety and PTSD.  She is doing much better in terms of fatigue and energy with the methylphenidate  10 mg twice daily.  She is having no side effects so this will be continued.  She will continue Cymbalta  60 mg twice daily for depression and fibromyalgia, Mysoline  50 mg in the evening for tremor as needed and Xanax  1 mg up to twice daily as needed for anxiety.  She  will return to see me in 3 months  Collaboration of Care: Collaboration of Care: Referral or follow-up with counselor/therapist AEB patient will continue therapy with Fayne Hoover in our office  Patient/Guardian was advised Release of Information must be obtained prior to any record release in order to collaborate their care with an outside provider. Patient/Guardian was advised if they have not already done so to contact the registration department to sign all necessary forms in order for us  to release information regarding their care.   Consent: Patient/Guardian gives verbal consent for treatment and assignment of benefits for services provided during this visit. Patient/Guardian expressed understanding and agreed to proceed.    Alfredia Annas, MD 06/02/2023, 11:37 AM

## 2023-06-04 ENCOUNTER — Ambulatory Visit (INDEPENDENT_AMBULATORY_CARE_PROVIDER_SITE_OTHER): Payer: Medicare Other | Admitting: Psychiatry

## 2023-06-04 DIAGNOSIS — F321 Major depressive disorder, single episode, moderate: Secondary | ICD-10-CM | POA: Diagnosis not present

## 2023-06-04 NOTE — Progress Notes (Signed)
 Virtual Visit via Telephone Note  I connected with Erica Bennett on 06/04/23 at 10:11 AM EDT by telephone and verified that I am speaking with the correct person using two identifiers.  Location: Patient: Home Provider: Home office    I discussed the limitations, risks, security and privacy concerns of performing an evaluation and management service by telephone and the availability of in person appointments. I also discussed with the patient that there may be a patient responsible charge related to this service. The patient expressed understanding and agreed to proceed.      I provided  38 minutes of non-face-to-face time during this encounter.   Dicie Foster, LCSW                THERAPIST PROGRESS NOTE          Session Time:  Friday 06/04/2023 10:11 AM- 10:49 AM  Participation Level: Active  Behavioral Response: Alert, euthymic  Type of Therapy: Individual Therapy  Treatment Goals addressed: identify and  verbalize feelings associated with loss, learn and implement relapse prevention techniques  Progress on Goals:      Interventions: CBT and Supportive  Summary: Erica Bennett is a 78 y.o. female who presents with a history of depression and has been seen intermittlently by this clinican for several years.She denies any psychiatric hospitalizations. She continues to see psychiatrist Dr. Avanell Bob  for medication management.  Patient is resuming services due to experiencing increased stress and anxiety.  Per patient's report, her cat died in 2024-12-06and she began to experience sadness and crying spells.  Since taking Effexor  as prescribed by psychiatrist, patient reports crying spells have stopped.  However she continues to report increased worry and nervousness.  Stressors include a rent increase by $300 per month.  She also reports continued adjustment issues related to grief and loss now that both her husband and the cat are gone and she now is alone in her home.   She states hating the silence.    Patient last was seen via virtual visit about 2 weeks ago.  Patient reports continuing to feel much better since continued use of Ritalin  as prescribed by psychiatrist Dr. Avanell Bob.  She continues to experience increased energy, increased behavioral activation, decreased shaking, and increased endurance.  This continues to have a  positive effect on her mood.  Patient reports coping well with the anniversary of her husband's death as well as their wedding anniversary.  Per patient's report, she also talked with his daughter and reports this was very comforting and welcomed.  Patient expresses optimism about her future and continues to state being ready to move forward with her life.  She is hopeful about eventually moving as her name is now 24th on the waiting list for subsidized housing.   Suicidal/Homicidal: Nowithout intent/plan       Therapist Response:, reviewed symptoms, praised and reinforced patient's medication compliance, discussed effects, praised and reinforced patient's continued behavioral activation and socialization, discussed effects, encouraged patient to maintain consistent involvement, praised and reinforced patient's use of healthy coping strategies to manage anniversary of husband's death and their wedding anniversary, began to assist patient develop mental health maintenance plan, discussed ways to maintain positive self-care, began to identify early warning signs of depression, discussed stepdown plan to termination to include 1 more session  plan return in 2 weeks                   Diagnosis: Axis I: Major Depression, single  episode    Axis II: No diagnosis  Collaboration of Care: Psychiatrist AEB  patient works with psychiatrist Dr. Avanell Bob. , Clinician reviewing chart  Patient/Guardian was advised Release of Information must be obtained prior to any record release in order to collaborate their care with an outside provider. Patient/Guardian was  advised if they have not already done so to contact the registration department to sign all necessary forms in order for us  to release information regarding their care.   Consent: Patient/Guardian gives verbal consent for treatment and assignment of benefits for services provided during this visit. Patient/Guardian expressed understanding and agreed to proceed.   Dicie Foster, LCSW 06/04/2023

## 2023-08-13 ENCOUNTER — Ambulatory Visit (INDEPENDENT_AMBULATORY_CARE_PROVIDER_SITE_OTHER): Admitting: Psychiatry

## 2023-08-13 DIAGNOSIS — F321 Major depressive disorder, single episode, moderate: Secondary | ICD-10-CM | POA: Diagnosis not present

## 2023-08-13 NOTE — Progress Notes (Signed)
 Virtual Visit via Telephone Note  I connected with Erica Bennett on 08/13/23 at 9:02  AM EDT by telephone and verified that I am speaking with the correct person using two identifiers.  Location: Patient: Home Provider: Home office    I discussed the limitations, risks, security and privacy concerns of performing an evaluation and management service by telephone and the availability of in person appointments. I also discussed with the patient that there may be a patient responsible charge related to this service. The patient expressed understanding and agreed to proceed.      I provided  46 minutes of non-face-to-face time during this encounter.   Winton FORBES Rubinstein, LCSW                THERAPIST PROGRESS NOTE          Session Time:  Friday 08/13/2023 9:02 AM - 9:48 AM  Participation Level: Active  Behavioral Response: Alert, euthymic  Type of Therapy: Individual Therapy  Treatment Goals addressed: identify and  verbalize feelings associated with loss, learn and implement relapse prevention techniques  Progress on Goals:  Progressing    Interventions: CBT and Supportive  Summary: Erica Bennett is a 78 y.o. female who presents with a history of depression and has been seen intermittlently by this clinican for several years.She denies any psychiatric hospitalizations. She continues to see psychiatrist Dr. Okey  for medication management.  Patient is resuming services due to experiencing increased stress and anxiety.  Per patient's report, her cat died in December 16, 2024and she began to experience sadness and crying spells.  Since taking Effexor  as prescribed by psychiatrist, patient reports crying spells have stopped.  However she continues to report increased worry and nervousness.  Stressors include a rent increase by $300 per month.  She also reports continued adjustment issues related to grief and loss now that both her husband and the cat are gone and she now is alone in  her home.  She states hating the silence.    Patient last was seen via virtual visit about 2 months ago.  Patient reports increased stress triggered by transportation issues.  Per patient's report, a friend who normally provides transportation for patient recently informed her she is no longer able to provide transportation.  Patient is pursuing help through transportation agencies but reports cost will be very expensive for her as she does not qualify for Medicaid and these agencies budgets have been affected by recent budget cuts.  Patient expresses appropriate concern and reports not being overwhelmed by this as she is focusing on problem solving within her capability and expresses acceptance of things beyond her control.  She remains on the waiting list for subsidized housing and still is hopeful about moving.  Patient continues to attend church regularly.  She still has members of her support system but reports they have not been as available recently.  She continues to remain involved in activities.  Per her report her energy was low for a while as she was on Cipro.  However, her energy is returning.  She still reports positive effects from the use of Ritalin .  Patient still enjoys reading.  She remains pleased with her progress in therapy and expresses confidence in her ability to successfully use healthy coping strategies. Suicidal/Homicidal: Nowithout intent/plan       Therapist Response:, reviewed symptoms, discussed stressors, facilitated expression of thoughts and feelings, validated feelings, praised and reinforced patient's use of healthy coping strategies and problem-solving skills, discussed  patient's progress and treatment, reviewed ways to avoid relapse of depression, did termination, encouraged patient to contact this practice should she need psychotherapy services in the future   plan  Diagnosis: Axis I: Major Depression, single episode    Axis II: No diagnosis  Collaboration of Care:  Psychiatrist AEB  patient works with psychiatrist Dr. Okey. , Clinician reviewing chart  Patient/Guardian was advised Release of Information must be obtained prior to any record release in order to collaborate their care with an outside provider. Patient/Guardian was advised if they have not already done so to contact the registration department to sign all necessary forms in order for us  to release information regarding their care.   Consent: Patient/Guardian gives verbal consent for treatment and assignment of benefits for services provided during this visit. Patient/Guardian expressed understanding and agreed to proceed.   Winton FORBES Rubinstein, LCSW 08/13/2023  Outpatient Therapist Discharge Summary  Erica Bennett    [birth year]   Admission Date: 03/16/2023   Discharge Date:  08/13/2023 Reason for Discharge: Treatment goals accomplished Diagnosis:  Axis I: Moderate single current episode of major depressive disorder   Comments: Patient is encouraged to contact this practice should she need psychotherapy services in the future.  She will continue to see psychiatrist Dr. Okey for medication management.  Chellsie Gomer E Daila Elbert LCSW

## 2023-09-02 ENCOUNTER — Telehealth (HOSPITAL_COMMUNITY): Admitting: Psychiatry

## 2023-09-02 ENCOUNTER — Encounter (HOSPITAL_COMMUNITY): Payer: Self-pay | Admitting: Psychiatry

## 2023-09-02 DIAGNOSIS — F321 Major depressive disorder, single episode, moderate: Secondary | ICD-10-CM

## 2023-09-02 DIAGNOSIS — F4310 Post-traumatic stress disorder, unspecified: Secondary | ICD-10-CM | POA: Diagnosis not present

## 2023-09-02 MED ORDER — PRIMIDONE 50 MG PO TABS: 50.0000 mg | ORAL_TABLET | Freq: Every day | ORAL | 2 refills | Status: DC

## 2023-09-02 MED ORDER — METHYLPHENIDATE HCL 10 MG PO TABS: 10.0000 mg | ORAL_TABLET | Freq: Two times a day (BID) | ORAL | 0 refills | Status: AC

## 2023-09-02 MED ORDER — ALPRAZOLAM 1 MG PO TABS: 1.0000 mg | ORAL_TABLET | Freq: Two times a day (BID) | ORAL | 2 refills | Status: DC | PRN

## 2023-09-02 MED ORDER — METHYLPHENIDATE HCL 10 MG PO TABS: 10.0000 mg | ORAL_TABLET | Freq: Two times a day (BID) | ORAL | 0 refills | Status: DC

## 2023-09-02 MED ORDER — DULOXETINE HCL 60 MG PO CPEP: 60.0000 mg | ORAL_CAPSULE | Freq: Two times a day (BID) | ORAL | 3 refills | Status: DC

## 2023-09-02 NOTE — Progress Notes (Signed)
 Virtual Visit via Telephone Note  I connected with Erica Bennett on 09/02/23 at  9:40 AM EDT by telephone and verified that I am speaking with the correct person using two identifiers.  Location: Patient: home Provider: office   I discussed the limitations, risks, security and privacy concerns of performing an evaluation and management service by telephone and the availability of in person appointments. I also discussed with the patient that there may be a patient responsible charge related to this service. The patient expressed understanding and agreed to proceed.      I discussed the assessment and treatment plan with the patient. The patient was provided an opportunity to ask questions and all were answered. The patient agreed with the plan and demonstrated an understanding of the instructions.   The patient was advised to call back or seek an in-person evaluation if the symptoms worsen or if the condition fails to improve as anticipated.  I provided 20 minutes of non-face-to-face time during this encounter.   Barnie Gull, MD  [place] MD/PA/NP OP Progress Note  09/02/2023 10:02 AM Erica Bennett  MRN:  [id number]  Chief Complaint:  Chief Complaint  Patient presents with   Depression   Anxiety   ADD   Follow-up   HPI: This patient is a 78 year old widowed white female who lives alone in [place]. She has no children. She is on disability for blindness.   The patient returns for follow-up after 3 months regarding her depression anxiety and PTSD.  Overall she is doing fairly well considering her limitations.  She is legally blind and also has mixed connective tissue disease.  Nevertheless her mood has been stable on her current medications and she denies significant anxiety.  She is generally sleeping fairly well.  The methylphenidate  has helped a good deal with her energy and focus.  She is still having a little bit of difficulty with restless legs but the Mysoline  has helped.  She  has completed her therapy with Winton Rubinstein at least for now.  She denies thoughts of self-harm or suicide Visit Diagnosis:    ICD-10-CM   1. Moderate single current episode of major depressive disorder (HCC)  F32.1     2. PTSD (post-traumatic stress disorder)  F43.10       Past Psychiatric History: Prior outpatient treatment  Past Medical History:  Past Medical History:  Diagnosis Date   Anxiety    Blindness of both eyes    due to degenerative retina and acute glaucoma   Complication of anesthesia    pt has limited mouth opening due to bilateral tmj surgery   Fecal incontinence    Fibromyalgia    Generalized seizure disorder [facility])    neurologist-- dr jenel--  seizure is staring off and dropping things  (12-20-2019  per pt last seizure approx. 2005)   GERD (gastroesophageal reflux disease)    H/O Hashimoto thyroiditis    History of acute angle-closure glaucoma    bilateral eyes  s/p eye removal, now has prosthesis   History of hypertension    History of skin cancer    History of thyroid  nodule    bx left thyroid  nodule 11-17-2017;  s/p left thyroidectomy 11-07-2018, benign mass   Hyperlipidemia    Limitation of opening of mouth    per pt due to bilateral tmj surgery 1989   MDD (major depressive disorder)    Migraines    Mixed connective tissue disease (HCC)    rheumotologist-- dr mai--  taking  plaquenil    OA (osteoarthritis)    OSA (obstructive sleep apnea)    does not use CPAP   Osteoporosis    PONV (postoperative nausea and vomiting)    and hard to wake   Prosthetic eye globe    bilateral   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 ;  s/p enucleation     Past Surgical History:  Procedure Laterality Date   ABDOMINAL HYSTERECTOMY  1980s   AND APPENDECTOMY   ENUCLEATION Right 1984   right eye   EVISCERATION Left 1996   left eye   LAPAROSCOPIC CHOLECYSTECTOMY  1993   SHOULDER ARTHROSCOPY WITH SUBACROMIAL  DECOMPRESSION Right 09-15-2007  @[facility]    AND BURSECTOMY/  DEBRIDEMENT ROTATOR CUFF   TEMPOROMANDIBULAR JOINT SURGERY Bilateral 1989   THYROIDECTOMY Left 11/07/2018   Procedure: LEFT HEMI THYROIDECTOMY;  Surgeon: Karis Clunes, MD;  Location: [facility];  Service: ENT;  Laterality: Left;   TONSILLECTOMY  child    Family Psychiatric History: See below  Family History:  Family History  Problem Relation Age of Onset   Colon cancer Mother    Parkinson's disease Mother    Depression Maternal Aunt    Colon cancer Maternal Aunt    Depression Paternal Aunt    Alcohol abuse Paternal Uncle    Drug abuse Paternal Uncle    Parkinson's disease Maternal Grandfather    Depression Cousin    Colon cancer Other     Social History:  Social History   Socioeconomic History   Marital status: Married    Spouse name: Not on file   Number of children: 0   Years of education: 18   Highest education level: Not on file  Occupational History   Occupation: Retired  Tobacco Use   Smoking status: Former    Current packs/day: 0.00    Types: Cigarettes    Start date: 01/03/1972    Quit date: 01/03/1992    Years since quitting: 31.6   Smokeless tobacco: Never  Vaping Use   Vaping status: Never Used  Substance and Sexual Activity   Alcohol use: No    Alcohol/week: 0.0 standard drinks of alcohol   Drug use: Never   Sexual activity: Yes    Birth control/protection: Surgical  Other Topics Concern   Not on file  Social History Narrative   POA-[place]   Caffeine use: daily (1 soda per day, 1 coffee per day)   Left handed    Legally blind      Worked as LPN/RN for about 8 years   Social Drivers of Corporate investment banker Strain: Not on file  Food Insecurity: Not on file  Transportation Needs: Not on file  Physical Activity: Not on file  Stress: Not on file  Social Connections: Not on file    Allergies:  Allergies  Allergen Reactions   Bacitracin Rash   Neomycin Rash   Sulfa  Antibiotics Swelling and Rash   Sulfamethoxazole Swelling and Other (See Comments)   Egg-Derived Products Diarrhea and Nausea And Vomiting    Stomach cramps   Elavil  [Amitriptyline  Hcl] Other (See Comments)    Really bad tremors   Gabapentin  Other (See Comments)    Balance issues   Mixed Ragweed     Nasal congestion   Prozac [Fluoxetine Hcl]     Severe headache    Metabolic Disorder Labs: No results found for: HGBA1C, MPG No results found for: PROLACTIN Lab  Results  Component Value Date   CHOL 203 (H) 11/18/2017   TRIG 227 (H) 11/18/2017   HDL 51 11/18/2017   CHOLHDL 4.0 11/18/2017   LDLCALC 107 (H) 11/18/2017   Lab Results  Component Value Date   TSH 1.940 03/24/2021   TSH 2.940 03/02/2018    Therapeutic Level Labs: No results found for: LITHIUM No results found for: VALPROATE Lab Results  Component Value Date   CBMZ 4.0 01/16/2021   CBMZ 6.1 11/14/2019    Current Medications: Current Outpatient Medications  Medication Sig Dispense Refill   methylphenidate  (RITALIN ) 10 MG tablet Take 1 tablet (10 mg total) by mouth 2 (two) times daily with breakfast and lunch. 60 tablet 0   ALPRAZolam  (XANAX ) 1 MG tablet Take 1 tablet (1 mg total) by mouth 2 (two) times daily as needed. for anxiety 60 tablet 2   CARBATROL  200 MG 12 hr capsule TAKE ONE CAPSULE BY MOUTH ONCE DAILY (Patient not taking: Reported on 10/15/2022) 90 capsule 3   Cholecalciferol (VITAMIN D3 PO) Take 1 tablet by mouth daily.     DEXILANT  60 MG capsule TAKE ONE CAPSULE BY MOUTH DAILY; ON AN EMPTY STOMACH, THEN DO NOT EAT FOR ONE HOUR. (Patient taking differently: Take 60 mg by mouth daily before breakfast.) 30 capsule 5   DULoxetine  (CYMBALTA ) 60 MG capsule Take 1 capsule (60 mg total) by mouth 2 (two) times daily. 180 capsule 3   HYDROcodone-acetaminophen  (NORCO/VICODIN) 5-325 MG tablet Take 1 tablet by mouth every 6 (six) hours as needed for moderate pain.     hydroxychloroquine  (PLAQUENIL ) 200 MG  tablet Take 1 tablet (200 mg total) by mouth 2 (two) times daily. (Patient taking differently: Take 100 mg by mouth 2 (two) times daily. Take four 2 tablets by mouth with food or milk once daily.) 180 tablet 1   MAXALT  10 MG tablet Take 1 tablet (10 mg total) by mouth as needed. (Patient taking differently: Take 10 mg by mouth 3 (three) times daily as needed for migraine.) 10 tablet 5   methylphenidate  (RITALIN ) 10 MG tablet Take 1 tablet (10 mg total) by mouth 2 (two) times daily with breakfast and lunch. 60 tablet 0   methylphenidate  (RITALIN ) 10 MG tablet Take 1 tablet (10 mg total) by mouth 2 (two) times daily with breakfast and lunch. 60 tablet 0   metoprolol  succinate (TOPROL -XL) 100 MG 24 hr tablet Take 1 tablet (100 mg total) by mouth every evening. (Patient taking differently: Take 100 mg by mouth every evening.) 90 tablet 0   NARCAN 4 MG/0.1ML LIQD nasal spray kit Place 1 spray into the nose as needed (accidental overdose.). (Patient not taking: Reported on 10/15/2022)     NIACIN PO Take by mouth daily.     ondansetron  (ZOFRAN ) 4 MG tablet Take 4 mg by mouth every 8 (eight) hours as needed for nausea or vomiting.     primidone  (MYSOLINE ) 50 MG tablet Take 1 tablet (50 mg total) by mouth at bedtime. 30 tablet 2   VITAMIN D  PO Take 1 tablet by mouth daily.     No current facility-administered medications for this visit.     Musculoskeletal: Strength & Muscle Tone: na Gait & Station: na Patient leans: N/A  Psychiatric Specialty Exam: Review of Systems  Constitutional:  Positive for fatigue.  Eyes:  Positive for visual disturbance.  Musculoskeletal:  Positive for arthralgias, joint swelling and myalgias.  All other systems reviewed and are negative.   There were no vitals taken  for this visit.There is no height or weight on file to calculate BMI.  General Appearance: NA  Eye Contact:  NA  Speech:  Clear and Coherent  Volume:  Normal  Mood:  Euthymic  Affect:  NA  Thought  Process:  Goal Directed  Orientation:  Full (Time, Place, and Person)  Thought Content: WDL   Suicidal Thoughts:  No  Homicidal Thoughts:  No  Memory:  Immediate;   Good Recent;   Good Remote;   Good  Judgement:  Good  Insight:  Good  Psychomotor Activity:  Normal  Concentration:  Concentration: Good and Attention Span: Good  Recall:  Good  Fund of Knowledge: Good  Language: Good  Akathisia:  No  Handed:  Right  AIMS (if indicated): not done  Assets:  Communication Skills Desire for Improvement Resilience Social Support  ADL's:  Intact  Cognition: WNL  Sleep:  Good   Screenings: GAD-7    Advertising copywriter from 03/16/2023 in [facility] [place] Counselor from 03/30/2019 in [facility] [place]  Total GAD-7 Score 12 2   PHQ2-9    Flowsheet Row Counselor from 03/16/2023 in [facility] [place] Video Visit from 01/12/2023 in [facility] at [place] Video Visit from 03/31/2022 in [facility] at [place] Video Visit from 09/30/2021 in [facility] at [place] Counselor from 07/16/2021 in [facility] at [facility] Total Score 2 3 0 0 1  PHQ-9 Total Score 8 11 -- -- --   Flowsheet Row Counselor from 03/16/2023 in [facility] [place] Video Visit from 01/12/2023 in [facility] at [place] Video Visit from 03/31/2022 in [facility] at [place]  C-SSRS RISK CATEGORY No Risk No Risk No Risk     Assessment and Plan: This patient is a 78 year old female with a history of depression anxiety and PTSD.  She is doing well on her current regimen.  She will continue methylphenidate  10 mg twice daily for energy and fatigue, Cymbalta  60 mg twice daily for depression and  fibromyalgia, Mysoline  50 mg in the evening for tremor and restless legs and Xanax  1 mg twice daily as needed for anxiety.  She will return to see me in 3 months  Collaboration of Care: Collaboration of Care: Primary Care Provider AEB notes to be shared with PCP at patient request  Patient/Guardian was advised Release of Information must be obtained prior to any record release in order to collaborate their care with an outside provider. Patient/Guardian was advised if they have not already done so to contact the registration department to sign all necessary forms in order for us  to release information regarding their care.   Consent: Patient/Guardian gives verbal consent for treatment and assignment of benefits for services provided during this visit. Patient/Guardian expressed understanding and agreed to proceed.    Barnie Gull, MD 09/02/2023, 10:02 AM

## 2023-09-30 ENCOUNTER — Other Ambulatory Visit (HOSPITAL_COMMUNITY): Payer: Self-pay | Admitting: Psychiatry

## 2023-10-29 ENCOUNTER — Other Ambulatory Visit (HOSPITAL_COMMUNITY): Payer: Self-pay | Admitting: Psychiatry

## 2023-12-03 ENCOUNTER — Telehealth (HOSPITAL_COMMUNITY): Admitting: Psychiatry

## 2023-12-16 ENCOUNTER — Encounter (HOSPITAL_COMMUNITY): Payer: Self-pay | Admitting: Psychiatry

## 2023-12-16 ENCOUNTER — Telehealth (INDEPENDENT_AMBULATORY_CARE_PROVIDER_SITE_OTHER): Admitting: Psychiatry

## 2023-12-16 DIAGNOSIS — F4310 Post-traumatic stress disorder, unspecified: Secondary | ICD-10-CM

## 2023-12-16 DIAGNOSIS — F321 Major depressive disorder, single episode, moderate: Secondary | ICD-10-CM | POA: Diagnosis not present

## 2023-12-16 MED ORDER — DULOXETINE HCL 60 MG PO CPEP: 60.0000 mg | ORAL_CAPSULE | Freq: Two times a day (BID) | ORAL | 3 refills | Status: AC

## 2023-12-16 MED ORDER — METHYLPHENIDATE HCL 20 MG PO TABS: 20.0000 mg | ORAL_TABLET | Freq: Two times a day (BID) | ORAL | 0 refills | Status: AC

## 2023-12-16 MED ORDER — PRIMIDONE 50 MG PO TABS: 50.0000 mg | ORAL_TABLET | Freq: Every day | ORAL | 2 refills | Status: AC

## 2023-12-16 MED ORDER — ALPRAZOLAM 1 MG PO TABS: 1.0000 mg | ORAL_TABLET | Freq: Two times a day (BID) | ORAL | 2 refills | Status: AC | PRN

## 2023-12-16 NOTE — Progress Notes (Signed)
 Virtual Visit via Telephone Note  I connected with Erica Bennett on 12/16/23 at  1:40 PM EST by telephone and verified that I am speaking with the correct person using two identifiers.  Location: Patient: home Provider: office   I discussed the limitations, risks, security and privacy concerns of performing an evaluation and management service by telephone and the availability of in person appointments. I also discussed with the patient that there may be a patient responsible charge related to this service. The patient expressed understanding and agreed to proceed.      I discussed the assessment and treatment plan with the patient. The patient was provided an opportunity to ask questions and all were answered. The patient agreed with the plan and demonstrated an understanding of the instructions.   The patient was advised to call back or seek an in-person evaluation if the symptoms worsen or if the condition fails to improve as anticipated.  I provided 20 minutes of non-face-to-face time during this encounter.   Barnie Gull, MD  [facility] MD/PA/NP OP Progress Note  12/16/2023 2:01 PM Erica Bennett  MRN:  [id number]  Chief Complaint:  Chief Complaint  Patient presents with   Anxiety   Depression   Follow-up   HPI: This patient is a 78 year old widowed white female who lives alone in [place].  She has no children.  She is on disability for blindness.  The patient returns for follow-up after 3 months regarding her major depression, generalized anxiety and PTSD.  Overall she is doing well.  She recently moved to a new apartment and she really likes it.  She seems happier there as her husband committed suicide in the last apartment and always created bad memories.  She does think the Ritalin  is not working as well and her energy is dropped.  I suggested that we cautiously increase the dosage although if she feels jittery or stops eating she needs to let me know.  She is sleeping well her  mood is good and she denies significant anxiety. Visit Diagnosis:    ICD-10-CM   1. Moderate single current episode of major depressive disorder (HCC)  F32.1     2. PTSD (post-traumatic stress disorder)  F43.10       Past Psychiatric History: Prior outpatient treatment  Past Medical History:  Past Medical History:  Diagnosis Date   Anxiety    Blindness of both eyes    due to degenerative retina and acute glaucoma   Complication of anesthesia    pt has limited mouth opening due to bilateral tmj surgery   Fecal incontinence    Fibromyalgia    Generalized seizure disorder [facility])    neurologist-- dr jenel--  seizure is staring off and dropping things  (12-20-2019  per pt last seizure approx. 2005)   GERD (gastroesophageal reflux disease)    H/O Hashimoto thyroiditis    History of acute angle-closure glaucoma    bilateral eyes  s/p eye removal, now has prosthesis   History of hypertension    History of skin cancer    History of thyroid  nodule    bx left thyroid  nodule 11-17-2017;  s/p left thyroidectomy 11-07-2018, benign mass   Hyperlipidemia    Limitation of opening of mouth    per pt due to bilateral tmj surgery 1989   MDD (major depressive disorder)    Migraines    Mixed connective tissue disease    rheumotologist-- dr mai--  taking plaquenil    OA (osteoarthritis)  OSA (obstructive sleep apnea)    does not use CPAP   Osteoporosis    PONV (postoperative nausea and vomiting)    and hard to wake   Prosthetic eye globe    bilateral   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 ;  s/p enucleation     Past Surgical History:  Procedure Laterality Date   ABDOMINAL HYSTERECTOMY  1980s   AND APPENDECTOMY   ENUCLEATION Right 1984   right eye   EVISCERATION Left 1996   left eye   LAPAROSCOPIC CHOLECYSTECTOMY  1993   SHOULDER ARTHROSCOPY WITH SUBACROMIAL DECOMPRESSION Right 09-15-2007  @[facility]    AND BURSECTOMY/   DEBRIDEMENT ROTATOR CUFF   TEMPOROMANDIBULAR JOINT SURGERY Bilateral 1989   THYROIDECTOMY Left 11/07/2018   Procedure: LEFT HEMI THYROIDECTOMY;  Surgeon: Karis Clunes, MD;  Location: [facility];  Service: ENT;  Laterality: Left;   TONSILLECTOMY  child    Family Psychiatric History: See below  Family History:  Family History  Problem Relation Age of Onset   Colon cancer Mother    Parkinson's disease Mother    Depression Maternal Aunt    Colon cancer Maternal Aunt    Depression Paternal Aunt    Alcohol abuse Paternal Uncle    Drug abuse Paternal Uncle    Parkinson's disease Maternal Grandfather    Depression Cousin    Colon cancer Other     Social History:  Social History   Socioeconomic History   Marital status: Married    Spouse name: Not on file   Number of children: 0   Years of education: 18   Highest education level: Not on file  Occupational History   Occupation: Retired  Tobacco Use   Smoking status: Former    Current packs/day: 0.00    Types: Cigarettes    Start date: 01/03/1972    Quit date: 01/03/1992    Years since quitting: 31.9   Smokeless tobacco: Never  Vaping Use   Vaping status: Never Used  Substance and Sexual Activity   Alcohol use: No    Alcohol/week: 0.0 standard drinks of alcohol   Drug use: Never   Sexual activity: Yes    Birth control/protection: Surgical  Other Topics Concern   Not on file  Social History Narrative   POA-Dennis   Caffeine use: daily (1 soda per day, 1 coffee per day)   Left handed    Legally blind      Worked as LPN/RN for about 8 years   Social Drivers of Corporate Investment Banker Strain: Not on file  Food Insecurity: Food Insecurity Present (10/05/2023)   Received from [facility]   Hunger Vital Sign    Within the past 12 months, you worried that your food would run out before you got the money to buy more.: Sometimes true    Within the past 12 months, the food you bought just didn't last and  you didn't have money to get more.: Sometimes true  Transportation Needs: Unmet Transportation Needs (10/05/2023)   Received from [facility]   PRAPARE - Transportation    Lack of Transportation (Medical): Yes    Lack of Transportation (Non-Medical): Yes  Physical Activity: Sufficiently Active (10/05/2023)   Received from [facility]   Exercise Vital Sign    On average, how many days per week do you engage in moderate to strenuous exercise (like a brisk walk)?: 5 days  On average, how many minutes do you engage in exercise at this level?: 30 min  Stress: Stress Concern Present (10/05/2023)   Received from [facility] - Occupational Stress Questionnaire    Do you feel stress - tense, restless, nervous, or anxious, or unable to sleep at night because your mind is troubled all the time - these days?: To some extent  Social Connections: Not on file    Allergies:  Allergies  Allergen Reactions   Bacitracin Rash   Neomycin Rash   Sulfa Antibiotics Swelling and Rash   Sulfamethoxazole Swelling and Other (See Comments)   Egg Protein-Containing Drug Products Diarrhea and Nausea And Vomiting    Stomach cramps   Elavil  [Amitriptyline  Hcl] Other (See Comments)    Really bad tremors   Gabapentin  Other (See Comments)    Balance issues   Mixed Ragweed     Nasal congestion   Prozac [Fluoxetine Hcl]     Severe headache    Metabolic Disorder Labs: No results found for: HGBA1C, MPG No results found for: PROLACTIN Lab Results  Component Value Date   CHOL 203 (H) 11/18/2017   TRIG 227 (H) 11/18/2017   HDL 51 11/18/2017   CHOLHDL 4.0 11/18/2017   LDLCALC 107 (H) 11/18/2017   Lab Results  Component Value Date   TSH 1.940 03/24/2021   TSH 2.940 03/02/2018    Therapeutic Level Labs: No results found for: LITHIUM No results found for: VALPROATE Lab Results  Component Value Date   CBMZ 4.0 01/16/2021   CBMZ 6.1 11/14/2019     Current Medications: Current Outpatient Medications  Medication Sig Dispense Refill   methylphenidate  (RITALIN ) 20 MG tablet Take 1 tablet (20 mg total) by mouth 2 (two) times daily with breakfast and lunch. 60 tablet 0   methylphenidate  (RITALIN ) 20 MG tablet Take 1 tablet (20 mg total) by mouth 2 (two) times daily with breakfast and lunch. 60 tablet 0   methylphenidate  (RITALIN ) 20 MG tablet Take 1 tablet (20 mg total) by mouth 2 (two) times daily with breakfast and lunch. 60 tablet 0   ALPRAZolam  (XANAX ) 1 MG tablet Take 1 tablet (1 mg total) by mouth 2 (two) times daily as needed. for anxiety 60 tablet 2   CARBATROL  200 MG 12 hr capsule TAKE ONE CAPSULE BY MOUTH ONCE DAILY (Patient not taking: Reported on 10/15/2022) 90 capsule 3   Cholecalciferol (VITAMIN D3 PO) Take 1 tablet by mouth daily.     DEXILANT  60 MG capsule TAKE ONE CAPSULE BY MOUTH DAILY; ON AN EMPTY STOMACH, THEN DO NOT EAT FOR ONE HOUR. (Patient taking differently: Take 60 mg by mouth daily before breakfast.) 30 capsule 5   DULoxetine  (CYMBALTA ) 60 MG capsule Take 1 capsule (60 mg total) by mouth 2 (two) times daily. 180 capsule 3   HYDROcodone-acetaminophen  (NORCO/VICODIN) 5-325 MG tablet Take 1 tablet by mouth every 6 (six) hours as needed for moderate pain.     hydroxychloroquine  (PLAQUENIL ) 200 MG tablet Take 1 tablet (200 mg total) by mouth 2 (two) times daily. (Patient taking differently: Take 100 mg by mouth 2 (two) times daily. Take four 2 tablets by mouth with food or milk once daily.) 180 tablet 1   MAXALT  10 MG tablet Take 1 tablet (10 mg total) by mouth as needed. (Patient taking differently: Take 10 mg by mouth 3 (three) times daily as needed for migraine.) 10 tablet 5   methylphenidate  (RITALIN ) 10 MG tablet Take  1 tablet (10 mg total) by mouth 2 (two) times daily with breakfast and lunch. 60 tablet 0   methylphenidate  (RITALIN ) 10 MG tablet Take 1 tablet (10 mg total) by mouth 2 (two) times daily with breakfast  and lunch. 60 tablet 0   methylphenidate  (RITALIN ) 10 MG tablet TAKE ONE TABLET BY MOUTH TWICE DAILY WITH BREAKFAST AND LUNCH 60 tablet 0   metoprolol  succinate (TOPROL -XL) 100 MG 24 hr tablet Take 1 tablet (100 mg total) by mouth every evening. (Patient taking differently: Take 100 mg by mouth every evening.) 90 tablet 0   NARCAN 4 MG/0.1ML LIQD nasal spray kit Place 1 spray into the nose as needed (accidental overdose.). (Patient not taking: Reported on 10/15/2022)     NIACIN PO Take by mouth daily.     ondansetron  (ZOFRAN ) 4 MG tablet Take 4 mg by mouth every 8 (eight) hours as needed for nausea or vomiting.     primidone  (MYSOLINE ) 50 MG tablet Take 1 tablet (50 mg total) by mouth at bedtime. 30 tablet 2   VITAMIN D  PO Take 1 tablet by mouth daily.     No current facility-administered medications for this visit.     Musculoskeletal: Strength & Muscle Tone: na Gait & Station: na Patient leans: N/A  Psychiatric Specialty Exam: Review of Systems  Constitutional:  Positive for fatigue.  Eyes:  Positive for visual disturbance.  Musculoskeletal:  Positive for arthralgias.  All other systems reviewed and are negative.   There were no vitals taken for this visit.There is no height or weight on file to calculate BMI.  General Appearance: NA  Eye Contact:  NA  Speech:  Clear and Coherent  Volume:  Normal  Mood:  Euthymic  Affect:  Congruent  Thought Process:  Goal Directed  Orientation:  Full (Time, Place, and Person)  Thought Content: WDL   Suicidal Thoughts:  No  Homicidal Thoughts:  No  Memory:  Immediate;   Good Recent;   Good Remote;   Good  Judgement:  Good  Insight:  Good  Psychomotor Activity:  Decreased  Concentration:  Concentration: Good and Attention Span: Good  Recall:  Good  Fund of Knowledge: Good  Language: Good  Akathisia:  No  Handed:  Right  AIMS (if indicated): not done  Assets:  Communication Skills Desire for Improvement Resilience Social Support   ADL's:  Intact  Cognition: WNL  Sleep:  Good   Screenings: GAD-7    Advertising Copywriter from 03/16/2023 in [facility] [place] Counselor from 03/30/2019 in [facility] [place]  Total GAD-7 Score 12 2   PHQ2-9    Flowsheet Row Counselor from 03/16/2023 in [facility] [place] Video Visit from 01/12/2023 in [facility] [place] Video Visit from 03/31/2022 in [facility] [place] Video Visit from 09/30/2021 in [facility] [place] Counselor from 07/16/2021 in [facility] at [facility] Total Score 2 3 0 0 1  PHQ-9 Total Score 8 11 -- -- --   Flowsheet Row Counselor from 03/16/2023 in [facility] [place] Video Visit from 01/12/2023 in [facility] at [place] Video Visit from 03/31/2022 in [facility] at [place]  C-SSRS RISK CATEGORY No Risk No Risk No Risk     Assessment and Plan: This patient is a 78 year old female with a  history of major depression generalized anxiety and PTSD.  She continues to do well on her current regimen.  She will continue methylphenidate  but increase the dosage to 20 mg twice daily for energy and fatigue.  She will continue Cymbalta  60 mg twice daily for major depression and fibromyalgia, Mysoline  50 mg in the evening for restless legs and Xanax  1 mg twice daily as needed for anxiety.  She will return to see me in 3 months  Collaboration of Care: Collaboration of Care: Primary Care Provider AEB notes to be shared with PCP at patient's request  Patient/Guardian was advised Release of Information must be obtained prior to any record release in order to collaborate their care with an outside provider. Patient/Guardian was advised if they have  not already done so to contact the registration department to sign all necessary forms in order for us  to release information regarding their care.   Consent: Patient/Guardian gives verbal consent for treatment and assignment of benefits for services provided during this visit. Patient/Guardian expressed understanding and agreed to proceed.    Barnie Gull, MD 12/16/2023, 2:01 PM

## 2024-01-15 ENCOUNTER — Other Ambulatory Visit (HOSPITAL_COMMUNITY): Payer: Self-pay | Admitting: Psychiatry

## 2024-01-17 NOTE — Telephone Encounter (Signed)
 3 scripts sent on 11/20, please check with pharmacy

## 2024-01-21 ENCOUNTER — Other Ambulatory Visit (HOSPITAL_COMMUNITY): Payer: Self-pay | Admitting: Psychiatry

## 2024-03-13 ENCOUNTER — Telehealth (HOSPITAL_COMMUNITY): Admitting: Psychiatry
